# Patient Record
Sex: Male | Born: 1935 | Race: White | Hispanic: No | Marital: Married | Smoking: Former smoker
Health system: Southern US, Community
[De-identification: ages and names within clinical notes are randomized; demographics above are authoritative.]

## PROBLEM LIST (undated history)

## (undated) DIAGNOSIS — E785 Hyperlipidemia, unspecified: Secondary | ICD-10-CM

## (undated) DIAGNOSIS — H9193 Unspecified hearing loss, bilateral: Secondary | ICD-10-CM

## (undated) DIAGNOSIS — I10 Essential (primary) hypertension: Secondary | ICD-10-CM

## (undated) DIAGNOSIS — Z973 Presence of spectacles and contact lenses: Secondary | ICD-10-CM

## (undated) DIAGNOSIS — Z923 Personal history of irradiation: Secondary | ICD-10-CM

## (undated) DIAGNOSIS — N189 Chronic kidney disease, unspecified: Secondary | ICD-10-CM

## (undated) DIAGNOSIS — K219 Gastro-esophageal reflux disease without esophagitis: Secondary | ICD-10-CM

## (undated) DIAGNOSIS — I350 Nonrheumatic aortic (valve) stenosis: Secondary | ICD-10-CM

## (undated) DIAGNOSIS — I499 Cardiac arrhythmia, unspecified: Secondary | ICD-10-CM

## (undated) DIAGNOSIS — M109 Gout, unspecified: Secondary | ICD-10-CM

## (undated) DIAGNOSIS — D649 Anemia, unspecified: Secondary | ICD-10-CM

## (undated) DIAGNOSIS — R011 Cardiac murmur, unspecified: Secondary | ICD-10-CM

## (undated) DIAGNOSIS — G4730 Sleep apnea, unspecified: Secondary | ICD-10-CM

## (undated) HISTORY — PX: EYE SURGERY: SHX253

## (undated) HISTORY — PX: PERITONEAL CATHETER INSERTION: SHX2223

## (undated) HISTORY — PX: UPPER GI ENDOSCOPY: SHX6162

## (undated) HISTORY — PX: COLONOSCOPY: SHX174

---

## 2012-08-20 DIAGNOSIS — D229 Melanocytic nevi, unspecified: Secondary | ICD-10-CM | POA: Insufficient documentation

## 2015-02-09 DIAGNOSIS — I10 Essential (primary) hypertension: Secondary | ICD-10-CM | POA: Insufficient documentation

## 2015-02-09 DIAGNOSIS — E785 Hyperlipidemia, unspecified: Secondary | ICD-10-CM | POA: Insufficient documentation

## 2016-08-14 DIAGNOSIS — H9193 Unspecified hearing loss, bilateral: Secondary | ICD-10-CM | POA: Insufficient documentation

## 2016-08-14 DIAGNOSIS — K5790 Diverticulosis of intestine, part unspecified, without perforation or abscess without bleeding: Secondary | ICD-10-CM | POA: Insufficient documentation

## 2016-09-13 DIAGNOSIS — I350 Nonrheumatic aortic (valve) stenosis: Secondary | ICD-10-CM | POA: Insufficient documentation

## 2016-09-13 DIAGNOSIS — C4491 Basal cell carcinoma of skin, unspecified: Secondary | ICD-10-CM | POA: Insufficient documentation

## 2016-09-19 DIAGNOSIS — Q6102 Congenital multiple renal cysts: Secondary | ICD-10-CM | POA: Insufficient documentation

## 2016-12-17 DIAGNOSIS — Z9289 Personal history of other medical treatment: Secondary | ICD-10-CM

## 2016-12-17 DIAGNOSIS — C801 Malignant (primary) neoplasm, unspecified: Secondary | ICD-10-CM

## 2016-12-17 HISTORY — DX: Personal history of other medical treatment: Z92.89

## 2016-12-17 HISTORY — DX: Malignant (primary) neoplasm, unspecified: C80.1

## 2016-12-18 DIAGNOSIS — Z85828 Personal history of other malignant neoplasm of skin: Secondary | ICD-10-CM | POA: Insufficient documentation

## 2017-03-12 DIAGNOSIS — N3941 Urge incontinence: Secondary | ICD-10-CM | POA: Insufficient documentation

## 2017-03-12 DIAGNOSIS — N289 Disorder of kidney and ureter, unspecified: Secondary | ICD-10-CM | POA: Insufficient documentation

## 2017-06-06 DIAGNOSIS — R935 Abnormal findings on diagnostic imaging of other abdominal regions, including retroperitoneum: Secondary | ICD-10-CM | POA: Insufficient documentation

## 2017-06-10 DIAGNOSIS — K862 Cyst of pancreas: Secondary | ICD-10-CM | POA: Insufficient documentation

## 2018-03-03 DIAGNOSIS — D378 Neoplasm of uncertain behavior of other specified digestive organs: Secondary | ICD-10-CM | POA: Insufficient documentation

## 2018-03-03 DIAGNOSIS — D490 Neoplasm of unspecified behavior of digestive system: Secondary | ICD-10-CM | POA: Insufficient documentation

## 2018-03-04 DIAGNOSIS — D509 Iron deficiency anemia, unspecified: Secondary | ICD-10-CM | POA: Insufficient documentation

## 2018-10-06 ENCOUNTER — Ambulatory Visit (INDEPENDENT_AMBULATORY_CARE_PROVIDER_SITE_OTHER): Payer: Medicare Other | Admitting: Podiatry

## 2018-10-06 DIAGNOSIS — M7752 Other enthesopathy of left foot: Secondary | ICD-10-CM

## 2018-10-06 DIAGNOSIS — I4510 Unspecified right bundle-branch block: Secondary | ICD-10-CM | POA: Insufficient documentation

## 2018-10-06 DIAGNOSIS — M10072 Idiopathic gout, left ankle and foot: Secondary | ICD-10-CM | POA: Diagnosis not present

## 2018-10-06 DIAGNOSIS — R739 Hyperglycemia, unspecified: Secondary | ICD-10-CM | POA: Insufficient documentation

## 2018-10-06 DIAGNOSIS — R011 Cardiac murmur, unspecified: Secondary | ICD-10-CM | POA: Insufficient documentation

## 2018-10-06 DIAGNOSIS — R55 Syncope and collapse: Secondary | ICD-10-CM | POA: Insufficient documentation

## 2018-10-06 DIAGNOSIS — R0989 Other specified symptoms and signs involving the circulatory and respiratory systems: Secondary | ICD-10-CM | POA: Insufficient documentation

## 2018-10-06 DIAGNOSIS — K635 Polyp of colon: Secondary | ICD-10-CM | POA: Insufficient documentation

## 2018-10-06 DIAGNOSIS — M259 Joint disorder, unspecified: Secondary | ICD-10-CM | POA: Insufficient documentation

## 2018-10-06 NOTE — Patient Instructions (Signed)

## 2018-10-08 NOTE — Progress Notes (Signed)
Subjective:   Patient ID: Jason Avila, male   DOB: 84 y.o.   MRN: [id number]   HPI 84 year old male presents the office today for concerns of swelling, redness to his left big toe and he points along the area to the bunion.  He states that started last Monday.  He went to his primary care physician on Thursday he states on Saturday he started to improve.  He states that improved on its own is doing much better today.  He denies any recent injury or trauma.  No significant treatment.  He has no other concerns.   Review of Systems  All other systems reviewed and are negative.  No past medical history on file.     Current Outpatient Medications:  .  amLODipine (NORVASC) 5 MG tablet, amlodipine 5 mg tablet, Disp: , Rfl:  .  ASPIRIN 81 PO, Take by mouth., Disp: , Rfl:  .  atorvastatin (LIPITOR) 20 MG tablet, atorvastatin 20 mg tablet, Disp: , Rfl:  .  furosemide (LASIX) 20 MG tablet, Take by mouth., Disp: , Rfl:  .  oxybutynin (DITROPAN-XL) 5 MG 24 hr tablet, Take by mouth., Disp: , Rfl:  .  triamcinolone cream (KENALOG) 0.1 %, Apply topically to areas of rash on back twice daily until resolved., Disp: , Rfl:  .  Cholecalciferol (VITAMIN D-1000 MAX ST) 1000 units tablet, Take by mouth., Disp: , Rfl:  .  Cholecalciferol (VITAMIN D3 PO), Vitamin D3 1,000 unit capsule  Take 1 capsule every day by oral route., Disp: , Rfl:  .  famotidine (PEPCID AC) 10 MG tablet, Pepcid AC 10 mg tablet  Take 1 tablet twice a day by oral route., Disp: , Rfl:  .  ferrous sulfate 325 (65 FE) MG tablet, Take by mouth., Disp: , Rfl:  .  hydrALAZINE (APRESOLINE) 50 MG tablet, hydralazine 50 mg tablet, Disp: , Rfl:  .  Ibuprofen (IBU-200 PO), IBU-200  as needed, Disp: , Rfl:  .  IRON PO, iron 65 mg tablet  Take 1 tablet every day by oral route., Disp: , Rfl:  .  neomycin-polymyxin-hydrocortisone (CORTISPORIN) 3.5-10000-1 OTIC suspension, neomycin-polymyxin-hydrocort 3.5 mg-10,000 unit/mL-1 % ear drops,susp  3-4 times  daily, Disp: , Rfl:  .  Omega-3 Fatty Acids (OMEGA 3 PO), Omega 3  QD 1040mg , Disp: , Rfl:  .  ranitidine (ZANTAC) 150 MG tablet, Take 150 mg by mouth daily., Disp: , Rfl: 3 .  tolterodine (DETROL) 2 MG tablet, tolterodine 2 mg tablet, Disp: , Rfl:   Not on File        Objective:  Physical Exam  General: AAO x3, NAD  Dermatological: There is mild edema erythema to the left foot along the first metatarsal phalangeal joint specifically along the area of the bunion. There are no open sores, no preulcerative lesions, no rash or signs of infection present.  Vascular: Dorsalis Pedis artery and Posterior Tibial artery pedal pulses are 2/4 bilateral with immedate capillary fill time. There is no pain with calf compression, swelling, warmth, erythema.   Neruologic: Grossly intact via light touch bilateral.Protective threshold with Semmes Wienstein monofilament intact to all pedal sites bilateral.   Musculoskeletal: Moderate bunion deformities present.  There is no pain or crepitation with MPJ range of motion.  There is no significant tenderness palpation of the area and overall he states he is doing substantially better than when he was.  Muscular strength 5/5 is present in all groups tested bilateral.  Gait: Unassisted, Nonantalgic.  Assessment:  Left foot first MPJ capsulitis, likely gout     Plan:  -Treatment options discussed including all alternatives, risks, and complications -Etiology of symptoms were discussed -I independently reviewed the x-rays that he brought into the office.  Given his clinical symptoms and spontaneous onset as well as resolution I do think that he has gout.  Dispensed offloading pads and discussed shoe modifications.  Discussed diet modifications.  Discussed that if the symptoms recur to call the office and even take a look but otherwise he is doing well and he has no pain.  Trula Slade DPM

## 2018-11-16 DIAGNOSIS — S065XAA Traumatic subdural hemorrhage with loss of consciousness status unknown, initial encounter: Secondary | ICD-10-CM

## 2018-11-16 DIAGNOSIS — S065X9A Traumatic subdural hemorrhage with loss of consciousness of unspecified duration, initial encounter: Secondary | ICD-10-CM

## 2018-11-16 HISTORY — DX: Traumatic subdural hemorrhage with loss of consciousness of unspecified duration, initial encounter: S06.5X9A

## 2018-11-16 HISTORY — DX: Traumatic subdural hemorrhage with loss of consciousness status unknown, initial encounter: S06.5XAA

## 2018-12-15 HISTORY — PX: BURR HOLE FOR SUBDURAL HEMATOMA: SHX1275

## 2019-10-14 ENCOUNTER — Other Ambulatory Visit: Payer: Self-pay | Admitting: Otolaryngology

## 2019-10-20 ENCOUNTER — Other Ambulatory Visit (HOSPITAL_COMMUNITY)
Admission: RE | Admit: 2019-10-20 | Discharge: 2019-10-20 | Disposition: A | Discharge status: Home / Self Care | Payer: Medicare Other | Source: Ambulatory Visit | Attending: Otolaryngology | Admitting: Otolaryngology

## 2019-10-20 DIAGNOSIS — Z01812 Encounter for preprocedural laboratory examination: Secondary | ICD-10-CM | POA: Insufficient documentation

## 2019-10-20 DIAGNOSIS — Z20828 Contact with and (suspected) exposure to other viral communicable diseases: Secondary | ICD-10-CM | POA: Diagnosis not present

## 2019-10-21 ENCOUNTER — Other Ambulatory Visit: Payer: Self-pay

## 2019-10-21 ENCOUNTER — Encounter (HOSPITAL_COMMUNITY): Payer: Self-pay | Admitting: *Deleted

## 2019-10-21 LAB — NOVEL CORONAVIRUS, NAA (HOSP ORDER, SEND-OUT TO REF LAB; TAT 18-24 HRS): SARS-CoV-2, NAA: NOT DETECTED

## 2019-10-21 NOTE — Progress Notes (Signed)
Patient denies shortness of breath, fever, cough and chest pain.  PCP - Dr Margaretha Sheffield Cardiologist - Dr Rosalita Chessman Hematology - Dr Jodelle Green  Chest x-ray - 12/12/18 1 view CE EKG - 12/15/18 CE Stress Test - yrs ago per patient in [facility] [facility] - denies Cardiac Cath - denies  Anesthesia review: Yes  STOP now taking any Aspirin (unless otherwise instructed by your surgeon), Aleve, Naproxen, Ibuprofen, Motrin, Advil, Goody's, BC's, all herbal medications, fish oil, and all vitamins.   Coronavirus Screening Covid test on 10/21/19 was negative.  Patient verbalized understanding of instructions that were given via phone.

## 2019-10-22 ENCOUNTER — Encounter (HOSPITAL_COMMUNITY): Payer: Self-pay | Admitting: Vascular Surgery

## 2019-10-22 NOTE — Progress Notes (Signed)
Anesthesia Chart Review:  Case: [id number] Date/Time: 10/23/19 0715   Procedure: MICROLARYNGOSCOPY WITH CO2 LASER AND EXCISION OF VOCAL CORD LESION (Left )   Anesthesia type: General   Pre-op diagnosis: vocal cord lesion   Location: [facility] OR ROOM 12 / [facility]   Surgeon: Jerrell Belfast, MD      DISCUSSION: Patient is a 85 year old male scheduled for the above procedure. Patient first seen by Dr. Wilburn Cornelia on 10/13/19 for evaluation of chronic hoarseness. By notes, hoarseness and raspy voice began about 2 years ago after undergoing several GI procedure including pancreatic biopsy for what turned out to be a "benign" pancreatic lesion. Flexible laryngoscopy showed "patent anterior nasal cavity with minimal crusting, no discharge or infection.  Normal base of tongue and supraglottis Normal vocal cord mobility, the patient has an ulcerated erythematous lesion involving the left vocal cord and extending to the anterior commissure. Hypopharynx normal without mass, pooling of secretions or aspiration."  Findings concerning for possible laryngeal carcinoma, so above procedure recommended.     History includes former smoker, CKD stage IV, anemia, HLD, aortic stenosis (mild, 2018), skin cancer (BCC, nose), OSA, right BBB, hearing loss (wears hearing aids), GERD, SDH (acute on chronic SDH 11/21/18 found during w/u for headaches; new hyperdensity within hematoma 11/28/18, s/p burr hole 12/15/18, decrease SDH size 03/13/19 CT)  Currently no primary care records, but according to records in [place], Cr 2.7-3.0 and HGB 7.8-8.5 11/2018-12/2018.   Received nephology and pulmonology records this afternoon 10/22/19.  He saw nephrologist Dr. Prescott Gum on 07/30/19 with labs. 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  Most recent renal trends show: 07/30/19: BUN 45, Cr 3.00, K 3.9 06/10/19: Cr 3.2, K 4.4 05/20/19: Cr 3.5, K 4.5 08/05/18: Cr 2.60, K 4.4  He last saw cardiologist Dr. Rosalita Chessman with [facility] on 03/25/19.  Patient was doing well at that time without CV symptoms. No new cardiac testing ordered. Patient felt back at baseline following burr holes for SDH and CKD is followed by nephrology. [facility] staff say he is scheduled for routine follow-up next month.   10/20/19 COVID-19 test negative. He is a same day work-up, but seen by nephrology and cardiologist within the past 3-7 months. S/p burr holes 11/2018 for acute on chronic SDH, with stable/decreased size on March 2020 CT. Anesthesia team to review labs and evaluate patient on the day of surgeyr.     VS: Ht 5\' 11"  (1.803 m)   Wt 77.1 kg   BMI 23.71 kg/m    PROVIDERS: PCP - Dr. Margaretha Sheffield Cardiologist - Dr. [facility] Fleet  Hematology - Dr. Donnella Bi, MD is neurosurgeon (see [facility] Care Everywhere) Loreli Slot, MD is Nephrologist is with [facility]   LABS: He is for labs on arrival. See DISCUSSION regarding known baseline results (CKD stage IV, anemia).    IMAGES: CT brain 03/13/19: IMPRESSION:4 1. Burr holes. 2. Interval decrease in size of left subdural hematoma and decrease in mass effect on underlying brain as well as decrease in left to right midline shift. 3. No evidence for acute hemorrhage  1V PCXR 12/17/18 ([facility]; done for post-operative fever): FINDINGS:  Increased density now demonstrated in the right mid and lower lung and within the left lower lung. There is also a small right-sided pleural effusion. Cardiomediastinal silhouette is grossly stable allowing for technical differences. IMPRESSION:  1.  Pulmonary opacities as noted above which may be  on the basis of pneumonia, or aspiration. Asymmetric pulmonary edema would also be a diagnostic consideration.   EKG:  EKG 03/25/19 ([facility]): SR. RBBB. LAD. Bifascicular block. EKG 12/15/18 [facility]): NSR, LAFB, RBBB.   CV: Echo 04/30/17 ([facility]) Conclusions: Normal LV systolic function. EF 70%. AV poorly visualized, calcified with mild aortic stenosis. Mean gradient of 9. Peak gradient of 15. Doppler evidence of grade II diastolic dysfunction. Mild-moderate MR. Mild-moderate TR. RVSP of 41.  Stress Echo 04/30/17 ([facility]): Conclusions: Adequate stress echocardiogram with no evidence of myocardial ischemia by electrocardiogram, clinical, or echocardiographic criteria at workload achieved. Low risk stress test.    Past Medical History:  Diagnosis Date  . Anemia   . Aortic valve stenosis    mild AS 04/2017 (followed by Dr. Cleora Fleet, [facility])  . Bilateral hearing loss    bilateral hearing aids  . Cancer ([place]) 2018   basal cell carcinoma on nose  . Chronic kidney disease    ckd stage 4, bilateral cysts on kidneys [facility])  . Dysrhythmia    RBBB  . GERD (gastroesophageal reflux disease)   . Gout    left foot  . Heart murmur    no problems  . History of blood transfusion   . Hyperlipidemia   . Hypertension   . Sleep apnea   . Subdural hematoma ([place]) 11/2018  . Wears glasses     Past Surgical History:  Procedure Laterality Date  . BURR HOLE FOR SUBDURAL HEMATOMA  12/15/2018   brain  . COLONOSCOPY    . EYE SURGERY Bilateral    cataracts removed  . UPPER GI ENDOSCOPY      MEDICATIONS: No current facility-administered medications for this encounter.    Marland Kitchen amLODipine (NORVASC) 5 MG tablet  . atorvastatin (LIPITOR) 20 MG tablet  . Cholecalciferol (VITAMIN D3) 50 MCG (2000 UT) TABS  . COLACE 100 MG capsule  . famotidine (PEPCID AC) 10 MG tablet  . ferrous sulfate 325 (65 FE) MG tablet  . hydrALAZINE (APRESOLINE) 50 MG tablet  . sodium bicarbonate 650 MG tablet    Myra Gianotti, PA-C Surgical Short Stay/Anesthesiology [facility] Phone [phone] [facility] Phone [phone] 10/22/2019 3:59 PM

## 2019-10-22 NOTE — Anesthesia Preprocedure Evaluation (Addendum)
Anesthesia Evaluation  Patient identified by MRN, date of birth, ID band Patient awake    Reviewed: Allergy & Precautions, NPO status , Patient's Chart, lab work & pertinent test results  Airway Mallampati: II  TM Distance: >3 FB Neck ROM: Full    Dental no notable dental hx. (+) Teeth Intact   Pulmonary former smoker,    Pulmonary exam normal breath sounds clear to auscultation       Cardiovascular hypertension, Pt. on medications Normal cardiovascular exam+ Valvular Problems/Murmurs AS  Rhythm:Regular Rate:Normal     Neuro/Psych negative neurological ROS  negative psych ROS   GI/Hepatic Neg liver ROS, GERD  ,  Endo/Other    Renal/GU CRF and Renal InsufficiencyRenal disease     Musculoskeletal   Abdominal   Peds  Hematology  (+) Blood dyscrasia, anemia ,   Anesthesia Other Findings   Reproductive/Obstetrics                           Anesthesia Physical Anesthesia Plan  ASA: II  Anesthesia Plan: General   Post-op Pain Management:    Induction: Intravenous  PONV Risk Score and Plan: 2 and Treatment may vary due to age or medical condition, Ondansetron and Dexamethasone  Airway Management Planned: Oral ETT  Additional Equipment: None  Intra-op Plan:   Post-operative Plan: Extubation in OR  Informed Consent: I have reviewed the patients History and Physical, chart, labs and discussed the procedure including the risks, benefits and alternatives for the proposed anesthesia with the patient or authorized representative who has indicated his/her understanding and acceptance.     Dental advisory given  Plan Discussed with: CRNA  Anesthesia Plan Comments: (See PAT note written 10/22/2019 by Myra Gianotti, PA-C. SAME DAY WORK-UP. Mild AS in 2018. Known CKD4. Chronic SDH, s/p burr holes 11/2018. Diagnosis of MDS leukemia 05/2019 per nephrology notes.    )      Anesthesia Quick  Evaluation

## 2019-10-23 ENCOUNTER — Ambulatory Visit (HOSPITAL_COMMUNITY)
Admission: RE | Admit: 2019-10-23 | Discharge: 2019-10-23 | Disposition: A | Discharge status: Home / Self Care | Payer: Medicare Other | Attending: Otolaryngology | Admitting: Otolaryngology

## 2019-10-23 ENCOUNTER — Ambulatory Visit (HOSPITAL_COMMUNITY): Payer: Medicare Other | Admitting: Vascular Surgery

## 2019-10-23 ENCOUNTER — Encounter (HOSPITAL_COMMUNITY)
Admission: RE | Disposition: A | Discharge status: Home / Self Care | Payer: Self-pay | Source: Home / Self Care | Attending: Otolaryngology

## 2019-10-23 ENCOUNTER — Encounter (HOSPITAL_COMMUNITY): Payer: Self-pay

## 2019-10-23 ENCOUNTER — Other Ambulatory Visit: Payer: Self-pay

## 2019-10-23 DIAGNOSIS — G4733 Obstructive sleep apnea (adult) (pediatric): Secondary | ICD-10-CM | POA: Insufficient documentation

## 2019-10-23 DIAGNOSIS — I350 Nonrheumatic aortic (valve) stenosis: Secondary | ICD-10-CM | POA: Diagnosis not present

## 2019-10-23 DIAGNOSIS — D020 Carcinoma in situ of larynx: Secondary | ICD-10-CM | POA: Diagnosis not present

## 2019-10-23 DIAGNOSIS — N184 Chronic kidney disease, stage 4 (severe): Secondary | ICD-10-CM | POA: Insufficient documentation

## 2019-10-23 DIAGNOSIS — E785 Hyperlipidemia, unspecified: Secondary | ICD-10-CM | POA: Diagnosis not present

## 2019-10-23 DIAGNOSIS — Z79899 Other long term (current) drug therapy: Secondary | ICD-10-CM | POA: Diagnosis not present

## 2019-10-23 DIAGNOSIS — G4730 Sleep apnea, unspecified: Secondary | ICD-10-CM | POA: Diagnosis not present

## 2019-10-23 DIAGNOSIS — I129 Hypertensive chronic kidney disease with stage 1 through stage 4 chronic kidney disease, or unspecified chronic kidney disease: Secondary | ICD-10-CM | POA: Diagnosis not present

## 2019-10-23 DIAGNOSIS — Z85828 Personal history of other malignant neoplasm of skin: Secondary | ICD-10-CM | POA: Diagnosis not present

## 2019-10-23 DIAGNOSIS — K219 Gastro-esophageal reflux disease without esophagitis: Secondary | ICD-10-CM | POA: Diagnosis not present

## 2019-10-23 DIAGNOSIS — J383 Other diseases of vocal cords: Secondary | ICD-10-CM

## 2019-10-23 DIAGNOSIS — Z87891 Personal history of nicotine dependence: Secondary | ICD-10-CM | POA: Diagnosis not present

## 2019-10-23 DIAGNOSIS — C946 Myelodysplastic disease, not classified: Secondary | ICD-10-CM | POA: Diagnosis not present

## 2019-10-23 DIAGNOSIS — R490 Dysphonia: Secondary | ICD-10-CM | POA: Diagnosis present

## 2019-10-23 HISTORY — DX: Unspecified hearing loss, bilateral: H91.93

## 2019-10-23 HISTORY — DX: Nonrheumatic aortic (valve) stenosis: I35.0

## 2019-10-23 HISTORY — DX: Sleep apnea, unspecified: G47.30

## 2019-10-23 HISTORY — DX: Cardiac arrhythmia, unspecified: I49.9

## 2019-10-23 HISTORY — DX: Gastro-esophageal reflux disease without esophagitis: K21.9

## 2019-10-23 HISTORY — DX: Cardiac murmur, unspecified: R01.1

## 2019-10-23 HISTORY — DX: Essential (primary) hypertension: I10

## 2019-10-23 HISTORY — DX: Anemia, unspecified: D64.9

## 2019-10-23 HISTORY — DX: Hyperlipidemia, unspecified: E78.5

## 2019-10-23 HISTORY — DX: Presence of spectacles and contact lenses: Z97.3

## 2019-10-23 HISTORY — DX: Gout, unspecified: M10.9

## 2019-10-23 HISTORY — PX: MICROLARYNGOSCOPY: SHX5208

## 2019-10-23 HISTORY — DX: Chronic kidney disease, unspecified: N18.9

## 2019-10-23 LAB — CBC
HCT: 31.5 % — ABNORMAL LOW (ref 39.0–52.0)
Hemoglobin: 10 g/dL — ABNORMAL LOW (ref 13.0–17.0)
MCH: 27.4 pg (ref 26.0–34.0)
MCHC: 31.7 g/dL (ref 30.0–36.0)
MCV: 86.3 fL (ref 80.0–100.0)
Platelets: 224 10*3/uL (ref 150–400)
RBC: 3.65 MIL/uL — ABNORMAL LOW (ref 4.22–5.81)
RDW: 13.7 % (ref 11.5–15.5)
WBC: 3.8 10*3/uL — ABNORMAL LOW (ref 4.0–10.5)
nRBC: 0 % (ref 0.0–0.2)

## 2019-10-23 LAB — BASIC METABOLIC PANEL
Anion gap: 9 (ref 5–15)
BUN: 42 mg/dL — ABNORMAL HIGH (ref 8–23)
CO2: 24 mmol/L (ref 22–32)
Calcium: 8.7 mg/dL — ABNORMAL LOW (ref 8.9–10.3)
Chloride: 107 mmol/L (ref 98–111)
Creatinine, Ser: 3.86 mg/dL — ABNORMAL HIGH (ref 0.61–1.24)
GFR calc Af Amer: 16 mL/min — ABNORMAL LOW (ref >60)
GFR calc non Af Amer: 13 mL/min — ABNORMAL LOW (ref >60)
Glucose, Bld: 102 mg/dL — ABNORMAL HIGH (ref 70–99)
Potassium: 3.3 mmol/L — ABNORMAL LOW (ref 3.5–5.1)
Sodium: 140 mmol/L (ref 135–145)

## 2019-10-23 SURGERY — MICROLARYNGOSCOPY
Anesthesia: General | Site: Throat

## 2019-10-23 MED ORDER — EPHEDRINE SULFATE 50 MG/ML IJ SOLN
INTRAMUSCULAR | Status: DC | PRN
  Administered 2019-10-23: 5 mg via INTRAVENOUS

## 2019-10-23 MED ORDER — DEXAMETHASONE SODIUM PHOSPHATE 10 MG/ML IJ SOLN
INTRAMUSCULAR | Status: AC
  Filled 2019-10-23: qty 1

## 2019-10-23 MED ORDER — DEXAMETHASONE SODIUM PHOSPHATE 10 MG/ML IJ SOLN
INTRAMUSCULAR | Status: DC | PRN
  Administered 2019-10-23: 10 mg via INTRAVENOUS

## 2019-10-23 MED ORDER — LIDOCAINE 2% (20 MG/ML) 5 ML SYRINGE
INTRAMUSCULAR | Status: AC
  Filled 2019-10-23: qty 5

## 2019-10-23 MED ORDER — ONDANSETRON HCL 4 MG/2ML IJ SOLN
INTRAMUSCULAR | Status: DC | PRN
  Administered 2019-10-23: 4 mg via INTRAVENOUS

## 2019-10-23 MED ORDER — PROPOFOL 10 MG/ML IV BOLUS
INTRAVENOUS | Status: AC
  Filled 2019-10-23: qty 20

## 2019-10-23 MED ORDER — FENTANYL CITRATE (PF) 250 MCG/5ML IJ SOLN
INTRAMUSCULAR | Status: DC | PRN
  Administered 2019-10-23: 25 ug via INTRAVENOUS
  Administered 2019-10-23: 50 ug via INTRAVENOUS
  Administered 2019-10-23: 25 ug via INTRAVENOUS

## 2019-10-23 MED ORDER — SODIUM CHLORIDE 0.9 % IV SOLN
INTRAVENOUS | Status: DC | PRN
  Administered 2019-10-23: 08:00:00 via INTRAVENOUS

## 2019-10-23 MED ORDER — SUGAMMADEX SODIUM 200 MG/2ML IV SOLN
INTRAVENOUS | Status: DC | PRN
  Administered 2019-10-23: 155 mg via INTRAVENOUS

## 2019-10-23 MED ORDER — FENTANYL CITRATE (PF) 250 MCG/5ML IJ SOLN
INTRAMUSCULAR | Status: AC
  Filled 2019-10-23: qty 5

## 2019-10-23 MED ORDER — ONDANSETRON HCL 4 MG/2ML IJ SOLN
INTRAMUSCULAR | Status: AC
  Filled 2019-10-23: qty 2

## 2019-10-23 MED ORDER — ROCURONIUM BROMIDE 10 MG/ML (PF) SYRINGE
PREFILLED_SYRINGE | INTRAVENOUS | Status: AC
  Filled 2019-10-23: qty 10

## 2019-10-23 MED ORDER — EPINEPHRINE HCL (NASAL) 0.1 % NA SOLN
NASAL | Status: AC
  Filled 2019-10-23: qty 30

## 2019-10-23 MED ORDER — EPINEPHRINE PF 1 MG/ML IJ SOLN
INTRAMUSCULAR | Status: DC | PRN
  Administered 2019-10-23: 30 mL

## 2019-10-23 SURGICAL SUPPLY — 20 items
BNDG EYE OVAL (GAUZE/BANDAGES/DRESSINGS) ×6 IMPLANT
CANISTER SUCT 3000ML PPV (MISCELLANEOUS) ×3 IMPLANT
CONT SPEC 4OZ CLIKSEAL STRL BL (MISCELLANEOUS) ×6 IMPLANT
COVER BACK TABLE 60X90IN (DRAPES) ×3 IMPLANT
COVER WAND RF STERILE (DRAPES) ×3 IMPLANT
DRAPE HALF SHEET 40X57 (DRAPES) ×3 IMPLANT
GAUZE SPONGE 4X4 12PLY STRL (GAUZE/BANDAGES/DRESSINGS) ×3 IMPLANT
GLOVE BIO SURGEON STRL SZ7 (GLOVE) ×3 IMPLANT
KIT BASIN OR (CUSTOM PROCEDURE TRAY) ×3 IMPLANT
KIT TURNOVER KIT B (KITS) ×3 IMPLANT
NEEDLE HYPO 25GX1X1/2 BEV (NEEDLE) IMPLANT
NS IRRIG 1000ML POUR BTL (IV SOLUTION) ×3 IMPLANT
PAD ARMBOARD 7.5X6 YLW CONV (MISCELLANEOUS) ×6 IMPLANT
PATTIES SURGICAL .5 X3 (DISPOSABLE) ×3 IMPLANT
POSITIONER HEAD DONUT 9IN (MISCELLANEOUS) IMPLANT
SOL ANTI FOG 6CC (MISCELLANEOUS) ×2 IMPLANT
SOLUTION ANTI FOG 6CC (MISCELLANEOUS) ×1
TOWEL GREEN STERILE FF (TOWEL DISPOSABLE) ×3 IMPLANT
TUBE CONNECTING 12X1/4 (SUCTIONS) ×3 IMPLANT
WATER STERILE IRR 1000ML POUR (IV SOLUTION) ×3 IMPLANT

## 2019-10-23 NOTE — Anesthesia Procedure Notes (Signed)
Procedure Name: Intubation Date/Time: 10/23/2019 7:49 AM Performed by: Amadeo Garnet, CRNA Pre-anesthesia Checklist: Emergency Drugs available, Suction available, Patient being monitored, Patient identified and Timeout performed Patient Re-evaluated:Patient Re-evaluated prior to induction Oxygen Delivery Method: Circle system utilized Preoxygenation: Pre-oxygenation with 100% oxygen Induction Type: IV induction Ventilation: Mask ventilation without difficulty Laryngoscope Size: Mac and 3 Grade View: Grade I Tube type: Oral Tube size: 6.5 mm Airway Equipment and Method: Stylet Placement Confirmation: ETT inserted through vocal cords under direct vision,  positive ETCO2 and breath sounds checked- equal and bilateral Secured at: 23 cm Tube secured with: Tape Dental Injury: Teeth and Oropharynx as per pre-operative assessment

## 2019-10-23 NOTE — Anesthesia Postprocedure Evaluation (Signed)
Anesthesia Post Note  Patient: Jason Avila  Procedure(s) Performed: Microlaryngoscopy with vocal cord lesion biopsy (N/A Throat)     Patient location during evaluation: PACU Anesthesia Type: General Level of consciousness: awake and alert Pain management: pain level controlled Vital Signs Assessment: post-procedure vital signs reviewed and stable Respiratory status: spontaneous breathing, nonlabored ventilation, respiratory function stable and patient connected to nasal cannula oxygen Cardiovascular status: blood pressure returned to baseline and stable Postop Assessment: no apparent nausea or vomiting Anesthetic complications: no    Last Vitals:  Vitals:   10/23/19 0557 10/23/19 0828  BP: (!) 169/57   Pulse: 78   Resp: 18   Temp: 36.7 C 36.7 C  SpO2: 99%     Last Pain:  Vitals:   10/23/19 0828  TempSrc:   PainSc: 0-No pain                 Barnet Glasgow

## 2019-10-23 NOTE — H&P (Signed)
Jason Avila is an 85 y.o. adult.   Chief Complaint: Chronic Hoarseness HPI: Former smoker, 2 year hx of hoarseness  Past Medical History:  Diagnosis Date  . Anemia   . Aortic valve stenosis    mild AS 04/2017 (followed by Dr. Cleora Fleet, [facility])  . Bilateral hearing loss    bilateral hearing aids  . Cancer ([place]) 2018   basal cell carcinoma on nose  . Chronic kidney disease    ckd stage 4, bilateral cysts on kidneys [facility])  . Dysrhythmia    RBBB  . GERD (gastroesophageal reflux disease)   . Gout    left foot  . Heart murmur    no problems  . History of blood transfusion   . Hyperlipidemia   . Hypertension   . Sleep apnea   . Subdural hematoma ([place]) 11/2018  . Wears glasses     Past Surgical History:  Procedure Laterality Date  . BURR HOLE FOR SUBDURAL HEMATOMA  12/15/2018   brain  . COLONOSCOPY    . EYE SURGERY Bilateral    cataracts removed  . UPPER GI ENDOSCOPY      History reviewed. No pertinent family history. Social History:  reports that she has quit smoking. Her smoking use included cigarettes. She quit after 40.00 years of use. She has never used smokeless tobacco. She reports that she does not drink alcohol or use drugs.  Allergies: No Known Allergies  Medications Prior to Admission  Medication Sig Dispense Refill  . amLODipine (NORVASC) 5 MG tablet Take 5 mg by mouth daily.     Marland Kitchen atorvastatin (LIPITOR) 20 MG tablet Take 20 mg by mouth daily at 6 PM.     . Cholecalciferol (VITAMIN D3) 50 MCG (2000 UT) TABS Take 2,000 Units by mouth daily.     Marland Kitchen COLACE 100 MG capsule Take 100 mg by mouth every other day.    . famotidine (PEPCID AC) 10 MG tablet Take 20 mg by mouth 2 (two) times daily.     . ferrous sulfate 325 (65 FE) MG tablet Take 325 mg by mouth daily with breakfast.     . hydrALAZINE (APRESOLINE) 50 MG tablet Take 50 mg by mouth 3 (three) times daily.     . sodium bicarbonate 650 MG tablet Take 650 mg by mouth 2 (two) times  daily.      Results for orders placed or performed during the hospital encounter of 10/23/19 (from the past 48 hour(s))  CBC     Status: Abnormal   Collection Time: 10/23/19  6:12 AM  Result Value Ref Range   WBC 3.8 (L) 4.0 - 10.5 K/uL   RBC 3.65 (L) 4.22 - 5.81 MIL/uL   Hemoglobin 10.0 (L) 13.0 - 17.0 g/dL   HCT 31.5 (L) 39.0 - 52.0 %   MCV 86.3 80.0 - 100.0 fL   MCH 27.4 26.0 - 34.0 pg   MCHC 31.7 30.0 - 36.0 g/dL   RDW 13.7 11.5 - 15.5 %   Platelets 224 150 - 400 K/uL   nRBC 0.0 0.0 - 0.2 %    Comment: Performed at [facility], [place] [address] [place], [place] [address]  Basic metabolic panel     Status: Abnormal   Collection Time: 10/23/19  6:12 AM  Result Value Ref Range   Sodium 140 135 - 145 mmol/L   Potassium 3.3 (L) 3.5 - 5.1 mmol/L   Chloride 107 98 - 111 mmol/L   CO2  24 22 - 32 mmol/L   Glucose, Bld 102 (H) 70 - 99 mg/dL   BUN 42 (H) 8 - 23 mg/dL   Creatinine, Ser 3.86 (H) 0.61 - 1.24 mg/dL   Calcium 8.7 (L) 8.9 - 10.3 mg/dL   GFR calc non Af Amer 13 (L) >60 mL/min   GFR calc Af Amer 16 (L) >60 mL/min   Anion gap 9 5 - 15    Comment: Performed at [facility] [address], [place], [place] [address]   No results found.  Review of Systems  Constitutional: Negative.   HENT:       Hoarseness  Respiratory: Negative.   Cardiovascular: Negative.     Blood pressure (!) 169/57, pulse 78, temperature 98.1 F (36.7 C), temperature source Oral, resp. rate 18, height 5\' 11"  (1.803 m), weight 77.1 kg, SpO2 99 %. Physical Exam  Constitutional: She appears well-developed and well-nourished.  HENT:  Left vocal cord lesion  Neck: Normal range of motion. Neck supple.  Respiratory: Effort normal.  GI: Soft.     Assessment/Plan Adm for DL and bx  Jerrell Belfast, MD 10/23/2019, 7:30 AM

## 2019-10-23 NOTE — Op Note (Signed)
Operative Note: MICROLARYNGOSCOPY and BIOPSY  Patient: Jason Avila record number: [id number]  Date:10/23/2019  Pre-operative Indications: Chronic hoarseness  Postoperative Indications: Same  Surgical Procedure:  Microlaryngoscopy    Biopsy of left vocal cord lesion  Anesthesia: General endotracheal  Surgeon: Delsa Bern, M.D.  Assist: None  Complications: None  EBL: None   Brief History: The patient is a 85 y.o. adult with a history of 2-year history of hoarseness.  The patient is a former smoker and discontinued more than 20 years ago.  He denies sore throat, respiratory concerns or difficulty swallowing.  Laryngoscopy performed in the office showed an exophytic lesion involving the left vocal cord with normal mobility. Given the patient's history and findings I recommended microlaryngoscopy and biopsy under general anesthesia, risks and benefits were discussed in detail with the patient and their family. They understand and agree with our plan for surgery which is scheduled at [facility] on an elective basis.  Surgical Procedure: The patient is brought to the operating room on 10/23/2019 and placed in supine position on the operating table. General endotracheal anesthesia was established without difficulty. When the patient was adequately anesthetized, surgical timeout was performed and correct identification of the patient and the surgical procedure. The patient was positioned and prepped and draped in sterile fashion.  Laryngoscopy was then undertaken with examination of the oral cavity, oropharynx, supraglottis, larynx and hypopharynx.  No other abnormalities were identified.  The laryngoscope was positioned for direct visualization of the patient's larynx.  Endotracheal tube was then removed and jet ventilation was undertaken for maintenance of patient's general anesthesia.  Patient ventilated well with good gas exchange.  With the patient stable and prepared  for surgery with adequate general anesthesia, the operating microscope was moved into position for microlaryngoscopy.  Excellent visualization of the entire larynx was achieved.  The patient was found to have broadly based exophytic lesion involving the anterior half of the left vocal cord with normal vocal cord mobility.  The vocal cord was firm and the lesion extended to the anterior commissure.  Using cup forceps, multiple biopsies were taken including superficial and deep aspects of the lesion.  Reexamination showed minimal bleeding, adrenaline soaked cottonoid pledget was placed over the biopsy site and left in place for several minutes for vasoconstriction.  There was no significant bleeding.  The patient's airway was stable, laryngoscope was removed without difficulty and the patient was managed with mask ventilation anesthesia for the remainder of his anesthesia recovery.  There was no evidence of airway trauma, no bleeding, no intraoral injury or tooth trauma.  Patient was awakened from anesthetic and transferred from the operating room to the recovery room in stable condition. There were no complications and blood loss was minimal.   Delsa Bern, M.D. [facility] 10/23/2019

## 2019-10-23 NOTE — Transfer of Care (Signed)
Immediate Anesthesia Transfer of Care Note  Patient: Jason Avila  Procedure(s) Performed: Microlaryngoscopy with vocal cord lesion biopsy (N/A Throat)  Patient Location: PACU  Anesthesia Type:General  Level of Consciousness: awake, alert  and oriented  Airway & Oxygen Therapy: Patient Spontanous Breathing and Patient connected to face mask oxygen  Post-op Assessment: Report given to RN, Post -op Vital signs reviewed and stable and Patient moving all extremities  Post vital signs: Reviewed and stable  Last Vitals:  Vitals Value Taken Time  BP 167/72 10/23/19 0826  Temp    Pulse 89 10/23/19 0829  Resp 18 10/23/19 0829  SpO2 100 % 10/23/19 0829  Vitals shown include unvalidated device data.  Last Pain:  Vitals:   10/23/19 0610  TempSrc:   PainSc: 0-No pain      Patients Stated Pain Goal: 3 (98/33/82 5053)  Complications: No apparent anesthesia complications

## 2019-10-23 NOTE — Progress Notes (Signed)
Preexisting hoarseness persists, no change

## 2019-10-24 ENCOUNTER — Encounter (HOSPITAL_COMMUNITY): Payer: Self-pay | Admitting: Otolaryngology

## 2019-10-26 LAB — SURGICAL PATHOLOGY

## 2019-11-02 ENCOUNTER — Telehealth: Payer: Self-pay | Admitting: *Deleted

## 2019-11-02 NOTE — Telephone Encounter (Signed)
Oncology Nurse Navigator Documentation  Placed introductory call to new referral patient Mr. Heinzman.  He was not available, spoke with his wife.  Introduced myself as the H&N oncology nurse navigator that works with Dr. Isidore Moos to whom husband has been referred by Dr. Wilburn Cornelia.  She confirmed understanding of referral, confirmed understanding of 11/23 8:00 telephone consult with Dr. Isidore Moos preceded by 7:30 nurse evaluation.  Briefly explained my role as their navigator, provided my contact information, noted I will be joining them during consult with Dr. Isidore Moos.   She expressed they have mixed feelings about driving to [facility] for RT but noted "I drove my dtr to [facility] every day for 7 weeks when she had RT for breast cancer".  I assured her Dr. Isidore Moos will objectively discuss options for treatment location if they prefer RT closer to home.    She verbalized understanding of information provided, agreed to call me with any questions prior to next week's appt.  Gayleen Orem, RN, BSN Head & Neck Oncology Nurse [place] at [place] [phone]

## 2019-11-03 NOTE — Progress Notes (Signed)
Head and Neck Cancer Location of Tumor / Histology:  10/23/19 FINAL MICROSCOPIC DIAGNOSIS: A. VOCAL CORD, LEFT LESION, BIOPSY: - At least squamous cell carcinoma in situ. B. ANTERIOR COMMISURE, BIOPSY: - At least squamous cell carcinoma in situ.  Patient presented with symptoms of: A two year history of hoarseness.  Biopsies of left vocal cord revealed: at least squamous cell carcinoma in situ  Nutrition Status Yes No Comments  Weight changes? [x]  []  He has lost about 15 lbs over the last 2 years or so.  Swallowing concerns? []  [x]    PEG? []  [x]     Referrals Yes No Comments  Social Work? []  [x]    Dentistry? []  [x]    Swallowing therapy? []  [x]    Nutrition? []  [x]    Med/Onc? []  [x]     Safety Issues Yes No Comments  Prior radiation? []  [x]    Pacemaker/ICD? []  [x]    Possible current pregnancy? []  [x]    Is the patient on methotrexate? []  [x]     Tobacco/Marijuana/Snuff/ETOH use: He quit smoking 20 years ago.  Past/Anticipated interventions by otolaryngology, if any:  10/23/19 Dr. Wilburn Cornelia Operative Note: MICROLARYNGOSCOPY and BIOPSY  Past/Anticipated interventions by medical oncology, if any:  Not scheduled at time of note.    Current Complaints / other details:

## 2019-11-05 ENCOUNTER — Telehealth: Payer: Self-pay | Admitting: *Deleted

## 2019-11-05 NOTE — Telephone Encounter (Signed)
Oncology Nurse Navigator Documentation  Returned call to pt's wife, confirmed 11/23 7:30 NE and 8:00 Consult with Dr. Isidore Moos are via telephone.  Gayleen Orem, RN, BSN Head & Neck Oncology Nurse [place] at [place] [phone]

## 2019-11-06 ENCOUNTER — Telehealth: Payer: Self-pay | Admitting: Radiation Oncology

## 2019-11-06 NOTE — Telephone Encounter (Signed)
Called patient to verify telephone visit for pre reg

## 2019-11-09 ENCOUNTER — Ambulatory Visit
Admission: RE | Admit: 2019-11-09 | Discharge: 2019-11-09 | Disposition: A | Discharge status: Home / Self Care | Payer: Medicare Other | Source: Ambulatory Visit | Attending: Radiation Oncology | Admitting: Radiation Oncology

## 2019-11-09 ENCOUNTER — Encounter: Payer: Self-pay | Admitting: *Deleted

## 2019-11-09 ENCOUNTER — Other Ambulatory Visit: Payer: Self-pay

## 2019-11-09 ENCOUNTER — Encounter: Payer: Self-pay | Admitting: Radiation Oncology

## 2019-11-09 DIAGNOSIS — C320 Malignant neoplasm of glottis: Secondary | ICD-10-CM

## 2019-11-09 NOTE — Progress Notes (Signed)
Radiation Oncology         [phone] ________________________________  Initial outpatient Consultation by telephone as patient was unable to access MyChart video during pandemic precautions  Name: Jason Avila MRN: [id number]  Date: 11/09/2019  DOB: 10-03-1933  WV:[id number], Pcp Not In  Jerrell Belfast, MD   REFERRING PHYSICIAN: Jerrell Belfast, MD  DIAGNOSIS:    ICD-10-CM   1. 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 adult who presented with hoarseness for at least two years.  Subsequently, the patient saw Dr. Wilburn Cornelia who performed laryngoscopy showing.  "Normal vocal cord mobility, the patient has an ulcerated erythematous lesion involving the left vocal cord and extending to the anterior commissure. "  Biopsies on 10-23-19 revealed: 10/23/19 FINAL MICROSCOPIC DIAGNOSIS: A. VOCAL CORD, LEFT LESION, BIOPSY: - At least squamous cell carcinoma in situ. B. ANTERIOR COMMISURE, BIOPSY: - At least squamous cell carcinoma in situ.   Swallowing issues, if any: none  Lumps in neck: no  No throat pain.  Weight Changes: ~15 lb loss over several months, but now maintaining    Tobacco history, if any: quit smoking 1998  Prior cancers, if any: Basal cell carcinoma of face   PREVIOUS RADIATION THERAPY: No  PAST MEDICAL HISTORY:  has a past medical history of Anemia, Aortic valve stenosis, Bilateral hearing loss, Cancer ([place]) (2018), Chronic kidney disease, Dysrhythmia, GERD (gastroesophageal reflux disease), Gout, Heart murmur, History of blood transfusion (2018), Hyperlipidemia, Hypertension, Sleep apnea, Subdural hematoma ([place]) (11/2018), and Wears glasses.    PAST  SURGICAL HISTORY: Past Surgical History:  Procedure Laterality Date  . BURR HOLE FOR SUBDURAL HEMATOMA  12/15/2018   brain  . COLONOSCOPY    . EYE SURGERY Bilateral    cataracts removed  . MICROLARYNGOSCOPY N/A 10/23/2019   Procedure: Microlaryngoscopy with vocal cord lesion biopsy;  Surgeon: Jerrell Belfast, MD;  Location: [facility];  Service: ENT;  Laterality: N/A;  . UPPER GI ENDOSCOPY      FAMILY HISTORY: family history is not on file.  SOCIAL HISTORY:  reports that she has quit smoking. Her smoking use included cigarettes. She quit after 40.00 years of use. She has never used smokeless tobacco. She reports that she does not drink alcohol or use drugs.  ALLERGIES: Patient has no known allergies.  MEDICATIONS:  Current Outpatient Medications  Medication Sig Dispense Refill  . amLODipine (NORVASC) 5 MG tablet Take 5 mg by mouth daily.     Marland Kitchen atorvastatin (LIPITOR) 20 MG tablet Take 20 mg by mouth daily at 6 PM.     . Cholecalciferol (VITAMIN D3) 50 MCG (2000 UT) TABS Take 2,000 Units by mouth daily.     Marland Kitchen COLACE 100 MG capsule Take 100 mg by mouth every other day.    . ferrous sulfate 325 (65 FE) MG tablet Take 325 mg by mouth daily with breakfast.     . hydrALAZINE (APRESOLINE) 50 MG tablet Take 50 mg by mouth 3 (three) times daily.     . sodium bicarbonate 650 MG tablet Take 650 mg by mouth 2 (two) times daily.     No current facility-administered medications for this encounter.    REVIEW OF SYSTEMS:  Notable for that above.   PHYSICAL EXAM:  vitals were not taken for this visit.     GEN: NAD  LABORATORY DATA:  Lab Results  Component Value Date   WBC 3.8 (L) 10/23/2019   HGB 10.0 (L) 10/23/2019   HCT 31.5 (L) 10/23/2019   MCV 86.3 10/23/2019   PLT 224 10/23/2019   CMP     Component Value Date/Time   NA 140 10/23/2019 0612   K 3.3 (L) 10/23/2019 0612   CL 107 10/23/2019 0612   CO2 24 10/23/2019 0612   GLUCOSE 102 (H) 10/23/2019 0612   BUN 42 (H) 10/23/2019 0612    CREATININE 3.86 (H) 10/23/2019 0612   CALCIUM 8.7 (L) 10/23/2019 0612   GFRNONAA 13 (L) 10/23/2019 0612   GFRAA 16 (L) 10/23/2019 0612      No results found for: TSH   RADIOGRAPHY: No results found.    IMPRESSION/PLAN:  This is a delightful patient with head and neck in situ disease and possibly cancer. I recommend radiotherapy for this patient.  We discussed the potential risks, benefits, and side effects of radiotherapy. We talked in detail about acute and late effects. We discussed that some of the most bothersome acute effects may be mucositis, dysgeusia, salivary changes, skin irritation, hair loss, dehydration, weight loss and fatigue. We talked about late effects which include but are not necessarily limited to dysphagia, hypothyroidism, permanent skin changes, injury to larynx, necrosis of tissue. No guarantees of treatment were given. A consent form was signed and placed in the patient's medical record. The patient is enthusiastic about proceeding with treatment. I look forward to participating in the patient's care.    Simulation (treatment planning) will take place in the near future  We also discussed that the treatment of head and neck cancer is a multidisciplinary process to maximize treatment outcomes and quality of life. For this reason the following referrals will be made:   Nutritionist for nutrition support during and after treatment.   Speech language pathology for swallowing and/or speech therapy.   Social work for social support.    Baseline labs including TSH.  This encounter was provided by telemedicine platform by telephone as patient was unable to access MyChart video during pandemic precautions The patient has given verbal consent for this type of encounter and has been advised to only accept a meeting of this type in a secure network environment. The time spent during this encounter was 25 minutes. The attendants for this meeting include Eppie Gibson  and  [employer].  During the encounter, Eppie Gibson was located at [facility] Radiation Oncology Department.  Jason Avila was located at home.   __________________________________________   Eppie Gibson, MD

## 2019-11-10 ENCOUNTER — Other Ambulatory Visit: Payer: Self-pay | Admitting: Radiation Oncology

## 2019-11-10 ENCOUNTER — Telehealth: Payer: Self-pay | Admitting: Nutrition

## 2019-11-10 DIAGNOSIS — C320 Malignant neoplasm of glottis: Secondary | ICD-10-CM

## 2019-11-10 DIAGNOSIS — Z1329 Encounter for screening for other suspected endocrine disorder: Secondary | ICD-10-CM

## 2019-11-10 DIAGNOSIS — R634 Abnormal weight loss: Secondary | ICD-10-CM

## 2019-11-10 NOTE — Progress Notes (Signed)
Oncology Nurse Navigator Documentation  Joined Jason Avila and his wife during Telemedicine consult with Dr. Isidore Moos. They confirmed intent to travel from [place] for [employer] at [facility]. They voiced understanding of tmt options of 6 weeks vs 4 weeks RT, same dose. They voiced understanding CT SIM to be scheduled next available, they requested late morning appt to accommodate travel. I confirmed they have my contact information, encouraged them to call with questions.  Gayleen Orem, RN, BSN Head & Neck Oncology Nurse [place] at [place] [phone]

## 2019-11-10 NOTE — Telephone Encounter (Signed)
Scheduled appt per 11/24 sch message - wife is aware of appt for nutritionist

## 2019-11-11 ENCOUNTER — Telehealth: Payer: Self-pay | Admitting: *Deleted

## 2019-11-11 NOTE — Telephone Encounter (Signed)
CALLED PATIENT TO INFORM OF LAB FOR 11-16-19 @ 2:15 PM, SPOKE WITH PATIENT AND HE IS AWARE OF THIS LAB

## 2019-11-16 ENCOUNTER — Ambulatory Visit
Admission: RE | Admit: 2019-11-16 | Discharge: 2019-11-16 | Disposition: A | Discharge status: Home / Self Care | Payer: Medicare Other | Source: Ambulatory Visit | Attending: Radiation Oncology | Admitting: Radiation Oncology

## 2019-11-16 ENCOUNTER — Encounter: Payer: Self-pay | Admitting: *Deleted

## 2019-11-16 ENCOUNTER — Other Ambulatory Visit: Payer: Self-pay

## 2019-11-16 DIAGNOSIS — E785 Hyperlipidemia, unspecified: Secondary | ICD-10-CM | POA: Diagnosis not present

## 2019-11-16 DIAGNOSIS — I10 Essential (primary) hypertension: Secondary | ICD-10-CM | POA: Diagnosis not present

## 2019-11-16 DIAGNOSIS — C320 Malignant neoplasm of glottis: Secondary | ICD-10-CM

## 2019-11-16 DIAGNOSIS — R634 Abnormal weight loss: Secondary | ICD-10-CM

## 2019-11-16 DIAGNOSIS — Z1329 Encounter for screening for other suspected endocrine disorder: Secondary | ICD-10-CM

## 2019-11-16 LAB — TSH: TSH: 1.791 u[IU]/mL (ref 0.320–4.118)

## 2019-11-17 DIAGNOSIS — C320 Malignant neoplasm of glottis: Secondary | ICD-10-CM | POA: Diagnosis not present

## 2019-11-17 DIAGNOSIS — E785 Hyperlipidemia, unspecified: Secondary | ICD-10-CM | POA: Diagnosis not present

## 2019-11-17 DIAGNOSIS — I10 Essential (primary) hypertension: Secondary | ICD-10-CM | POA: Diagnosis not present

## 2019-11-17 NOTE — Progress Notes (Signed)
  Radiation Oncology         [phone] ________________________________  Name: Jason Avila MRN: [id number]  Date: 11/16/2019  DOB: Mar 02, 1933  SIMULATION AND TREATMENT PLANNING NOTE  Outpatient    ICD-10-CM   1. 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  Identity was confirmed.  All relevant records and images related to the planned course of therapy were reviewed.  The patient freely provided informed written consent to proceed with treatment after reviewing the details related to the planned course of therapy. The consent form was witnessed and verified by the simulation staff.    Then, the patient was set-up in a stable reproducible supine position for radiation therapy.  Aquaplast head and should mask was custom fabricated for immobilization.  CT images were obtained without contrast.  Surface markings were placed.  The CT images were loaded into the planning software.    TREATMENT PLANNING NOTE: Treatment planning then occurred.  The radiation prescription was entered and confirmed.    A total of 3 medically necessary complex treatment devices were fabricated and supervised by me (2 wedges for the opposed fields and the Aquaplast head and shoulder mask). I have requested : 3D Simulation  I have requested a DVH of the following structures: target volume, esophagus, spinal cord.  I have ordered:Nutrition Consult  The patient will receive 54 Gy in 20 fractions to the glottic larynx.  -----------------------------------  Eppie Gibson, MD

## 2019-11-18 ENCOUNTER — Ambulatory Visit
Admission: RE | Admit: 2019-11-18 | Discharge: 2019-11-18 | Disposition: A | Discharge status: Home / Self Care | Payer: Medicare Other | Source: Ambulatory Visit | Attending: Radiation Oncology | Admitting: Radiation Oncology

## 2019-11-18 ENCOUNTER — Other Ambulatory Visit: Payer: Self-pay

## 2019-11-18 ENCOUNTER — Telehealth: Payer: Self-pay | Admitting: *Deleted

## 2019-11-18 DIAGNOSIS — C320 Malignant neoplasm of glottis: Secondary | ICD-10-CM | POA: Diagnosis not present

## 2019-11-18 NOTE — Telephone Encounter (Signed)
Oncology Nurse Navigator Documentation  Called pt's wife in follow-up to his New Start to review New Patient Packet, to confirm 10:30 [place] arrival for tomorrow morning's MDC to meet with SLP at 11:00 after which he will proceed to RT.  Gayleen Orem, RN, BSN Head & Neck Oncology Nurse [place] at [place] [phone]

## 2019-11-19 ENCOUNTER — Encounter: Payer: Self-pay | Admitting: *Deleted

## 2019-11-19 ENCOUNTER — Ambulatory Visit
Admission: RE | Admit: 2019-11-19 | Discharge: 2019-11-19 | Disposition: A | Discharge status: Home / Self Care | Payer: Medicare Other | Source: Ambulatory Visit | Attending: Radiation Oncology | Admitting: Radiation Oncology

## 2019-11-19 ENCOUNTER — Ambulatory Visit: Payer: Medicare Other | Attending: Radiation Oncology

## 2019-11-19 ENCOUNTER — Other Ambulatory Visit: Payer: Self-pay

## 2019-11-19 DIAGNOSIS — C320 Malignant neoplasm of glottis: Secondary | ICD-10-CM

## 2019-11-19 DIAGNOSIS — R490 Dysphonia: Secondary | ICD-10-CM

## 2019-11-19 DIAGNOSIS — R1310 Dysphagia, unspecified: Secondary | ICD-10-CM

## 2019-11-19 MED ORDER — SONAFINE EX EMUL
1.0000 "application " | Freq: Once | CUTANEOUS | Status: AC
  Administered 2019-11-19: 1 via TOPICAL

## 2019-11-19 NOTE — Progress Notes (Signed)

## 2019-11-19 NOTE — Patient Instructions (Signed)
SWALLOWING EXERCISES Do these 6 of the 7 days per week until 6 months after your last day of radiation, then 2 times per week afterwards  1. Effortful Swallows - Press your tongue against the roof of your mouth for 3 seconds, then squeeze          the muscles in your neck while you swallow your saliva or a sip of water - Repeat 20 times, 2-3 times a day, and use whenever you eat or drink  2. Pitch Raise - Repeat "he", once per second in as high of a pitch as you can - Repeat 20 times, 2-3 times a day  3. Breath Hold - Say "HUH!" loudly, then hold your breath for 3 seconds at your voice box - Repeat 20 times, 2-3 times a day         4.  Siren exercise  Say "eeee" in as low of a pitch as you can and glide up to as high of a pitch as you can, then glide back down as low as you can  Repeat 10 times         5. Super swallow  - take a breath and hold it  - bear down like you are pushing your bowels  - swallow and immediately cough

## 2019-11-19 NOTE — Therapy (Signed)
[facility] [address] [place], [place], [address] Phone: [phone]   Fax:  [phone]  Speech Language Pathology Evaluation  Patient Details  Name: Jason Avila MRN: [id number] Date of Birth: 08/06/1933 Referring Provider (SLP): Eppie Gibson, MD   Encounter Date: 11/19/2019  End of Session - 11/19/19 1314    Visit Number  1    Number of Visits  4    Date for SLP Re-Evaluation  02/17/20    SLP Start Time  1218    SLP Stop Time   1252    SLP Time Calculation (min)  34 min    Activity Tolerance  Patient tolerated treatment well       Past Medical History:  Diagnosis Date  . Anemia   . Aortic valve stenosis    mild AS 04/2017 (followed by Dr. Cleora Fleet, [place])  . Bilateral hearing loss    bilateral hearing aids  . Cancer ([place]) 2018   basal cell carcinoma on nose  . Chronic kidney disease    ckd stage 4, bilateral cysts on kidneys [facility])  . Dysrhythmia    RBBB  . GERD (gastroesophageal reflux disease)   . Gout    left foot  . Heart murmur    no problems  . History of blood transfusion 2018  . Hyperlipidemia   . Hypertension   . Sleep apnea   . Subdural hematoma ([place]) 11/2018  . Wears glasses     Past Surgical History:  Procedure Laterality Date  . BURR HOLE FOR SUBDURAL HEMATOMA  12/15/2018   brain  . COLONOSCOPY    . EYE SURGERY Bilateral    cataracts removed  . MICROLARYNGOSCOPY N/A 10/23/2019   Procedure: Microlaryngoscopy with vocal cord lesion biopsy;  Surgeon: Jerrell Belfast, MD;  Location: [facility];  Service: ENT;  Laterality: N/A;  . UPPER GI ENDOSCOPY      There were no vitals filed for this visit.  Subjective Assessment - 11/19/19 1224    Subjective  Pt denies any difficulty when directly asked, with swallowing during meals/liquids.    Currently in Pain?  No/denies         SLP Evaluation [facility] - 11/19/19 1315      SLP Visit Information   SLP  Received On  11/19/19    Referring Provider (SLP)  Eppie Gibson, MD    Onset Date  2 year difficulty with hoarseness    Medical Diagnosis  lt vocal cord SCC      Pain Assessment   Currently in Pain?  No/denies      General Information   HPI  Pt presented with hoarseness x2 years to Dr. Wilburn Cornelia who did laryngoscopy showing lesion on lt vocal fold extending to anterior commisure. Pt began rad tx yesterday - 20 fxt to larynx.       Prior Functional Status   Cognitive/Linguistic Baseline  Within functional limits      Cognition   Overall Cognitive Status  Within Functional Limits for tasks assessed      Oral Motor/Sensory Function   Overall Oral Motor/Sensory Function  Appears within functional limits for tasks assessed      Motor Speech   Overall Motor Speech  Appears within functional limits for tasks assessed    Phonation  Hoarse        Pt currently tolerates regular diet and thin liquids. Voice mod hoarse.   Because data states the risk for dysphagia during and after  radiation treatment is high due to undergoing radiation tx, SLP taught pt about the possibility of reduced/limited ability for PO intake during rad tx. SLP encouraged pt to continue swallowing POs as far into rad tx as possible, even ingesting POs and/or completing HEP shortly after administration of pain meds.   SLP educated pt re: changes to swallowing musculature after rad tx, and why adherence to dysphagia HEP provided today and PO consumption was necessary to inhibit muscular disuse atrophy and to reduce muscle fibrosis following rad tx. Pt demonstrated understanding of these things to SLP.    SLP then developed a HEP for pt and pt was instructed how to perform exercises involving lingual, vocal, and pharyngeal strengthening. SLP performed each exercise and pt return demonstrated each exercise. SLP ensured pt performance was correct prior to moving on to next exercise. Pt was instructed to complete this program 2  times a day, 6-7 days/week until 6 months after his last rad tx, then x2 a week after that.                SLP Education - 11/19/19 1313    Education Details  HEP procedure, late effects head/neck radiaiton on swallowing ability    Person(s) Educated  Patient    Methods  Explanation;Demonstration;Verbal cues;Handout    Comprehension  Need further instruction;Verbal cues required;Returned demonstration;Verbalized understanding       SLP Short Term Goals - 11/19/19 1316      SLP SHORT TERM GOAL #1   Title  Pt will complete HEP with rare min A over two sessions    Time  2    Period  --   sessions, for all STGs   Status  New      SLP SHORT TERM GOAL #2   Title  Pt will tell SLP 3 overt s/s aspiration PNA with modified independence    Time  2    Status  New      SLP SHORT TERM GOAL #3   Title  Pt will tell SLP rationale for HEP completion    Time  2    Status  New       SLP Long Term Goals - 11/19/19 1323      SLP LONG TERM GOAL #1   Title  Pt will complete HEP with modified independence over 3 visits    Time  4    Status  New      SLP LONG TERM GOAL #2   Title  Pt will tell SLP rationale for HEP completion over 3 sessions    Time  3    Period  --   (session #4)   Status  New      SLP LONG TERM GOAL #3   Title  Pt will tell SLP when to decr frequency of HEP over two sessions    Time  6    Status  New       Plan - 11/19/19 1315    Clinical Impression Statement  Pt presents today with mod hoarseness and with WNL swallowing ability pre-rad tx. No overt s/sx aspiration PNA reported or observed today. Data suggests that as pts progress through rad or chemorad therapy that their swallowing ability will decrease. Also, WNL swallowing is threatened by muscle fibrosis that will likely develop after rad is completed. Therefore, skilled ST would be beneficial to the pt in order to regularly assess pt's safety with POs and/or need for instrumental swallow assessment,  as well as  to assess proper completion of HEP.    Speech Therapy Frequency  --   once approx every four weeks   Duration  --   3-4 visits over 90 days   Treatment/Interventions  Aspiration precaution training;Pharyngeal strengthening exercises;Trials of upgraded texture/liquids;Diet toleration management by SLP;Internal/external aids;Patient/family education;Compensatory strategies;SLP instruction and feedback    Potential to Achieve Goals  Good       Patient will benefit from skilled therapeutic intervention in order to improve the following deficits and impairments:   Dysphagia, unspecified type - Plan: SLP plan of care cert/re-cert  Hoarseness - Plan: SLP plan of care cert/re-cert    Problem List Patient Active Problem List   Diagnosis Date Noted  . 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 Carotid bruit 10/06/2018  . Disorder of shoulder 10/06/2018  . Heart murmur 10/06/2018  . Hyperglycemia 10/06/2018  . Polyp of colon 10/06/2018  . Right bundle branch block 10/06/2018  . Syncope 10/06/2018  . Iron deficiency anemia 03/04/2018  . 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 Pancreatic cyst 06/10/2017  . Abnormal CT of the abdomen 06/06/2017  . Urge incontinence of urine 03/12/2017  . Kidney disease 03/12/2017  . Personal history of malignant neoplasm of skin 12/18/2016  . Multiple congenital cysts of kidney 09/19/2016  . Aortic valve stenosis 09/13/2016  . Basal cell carcinoma of skin 09/13/2016  . Bilateral hearing loss 08/14/2016  . Diverticular disease 08/14/2016  . Essential hypertension 02/09/2015  . Hyperlipidemia 02/09/2015  . Multiple nevi 08/20/2012    [facility] ,MS, CCC-SLP  11/19/2019, 1:31 PM  [facility] [address] [place], [place], [address] Phone: [phone]   Fax:  [phone]  Name: Jason Avila MRN: [id number] Date of Birth:  1933-07-29

## 2019-11-20 ENCOUNTER — Other Ambulatory Visit: Payer: Self-pay

## 2019-11-20 ENCOUNTER — Ambulatory Visit
Admission: RE | Admit: 2019-11-20 | Discharge: 2019-11-20 | Disposition: A | Discharge status: Home / Self Care | Payer: Medicare Other | Source: Ambulatory Visit | Attending: Radiation Oncology | Admitting: Radiation Oncology

## 2019-11-20 DIAGNOSIS — C320 Malignant neoplasm of glottis: Secondary | ICD-10-CM | POA: Diagnosis not present

## 2019-11-23 ENCOUNTER — Telehealth: Payer: Self-pay | Admitting: Nutrition

## 2019-11-23 ENCOUNTER — Other Ambulatory Visit: Payer: Self-pay

## 2019-11-23 ENCOUNTER — Ambulatory Visit
Admission: RE | Admit: 2019-11-23 | Discharge: 2019-11-23 | Disposition: A | Discharge status: Home / Self Care | Payer: Medicare Other | Source: Ambulatory Visit | Attending: Radiation Oncology | Admitting: Radiation Oncology

## 2019-11-23 ENCOUNTER — Other Ambulatory Visit: Payer: Self-pay | Admitting: Radiation Oncology

## 2019-11-23 DIAGNOSIS — C320 Malignant neoplasm of glottis: Secondary | ICD-10-CM

## 2019-11-23 MED ORDER — LIDOCAINE VISCOUS HCL 2 % MT SOLN: OROMUCOSAL | 4 refills | Status: DC

## 2019-11-23 NOTE — Telephone Encounter (Signed)
Left message for patient to verify telephone visit for pre reg °

## 2019-11-23 NOTE — Progress Notes (Signed)
Oncology Nurse Navigator Documentation  To provide support, encouragement and care continuity, met with Mr. Buchheit during his CT SIM. He was accompanied by his wife.   He tolerated procedure without difficulty, denied questions/concerns.   I toured him to [facility] 3 treatment area, explained procedures for lobby registration, arrival to Radiation Waiting, arrival to tmt area and preparation for tmt.  He voiced understanding.   Escorted him to [facility] entrance where he was met by his wife. I encouraged them to call me prior to 12/2 New [facility] with questions/concerns.  Gayleen Orem, RN, BSN Head & Neck Oncology Nurse [place] at [place] [phone]

## 2019-11-23 NOTE — Progress Notes (Signed)
Oncology Nurse Navigator Documentation  Met with Mr. Falkenstein upon his arrival for H&N MDC.    Provided verbal and written overview of MDC, the clinicians who will be seeing him, encouraged him to ask questions during his time with them.  He was seen by SLP Carl Schinke following RT. Provided New Patient Information packet, discussed contents: Contact information for physician(s), myself, other members of the Care Team. Advance Directive information ([facility] blue pamphlet with LCSW contact info); provided [facility] AD booklet at his request, encouraged him to contact Lauren Somers LCSW to complete. Fall Prevention Patient Safety Plan Financial Assistance Information sheet [facility] map with highlight of [facility] SLP information sheet Symptom Management Clinic information Escorted him to [facility] entrance where he was met by wife.    Rick Diehl, RN, BSN Head & Neck Oncology Navigator  [facility] [place] [phone]  

## 2019-11-24 ENCOUNTER — Ambulatory Visit
Admission: RE | Admit: 2019-11-24 | Discharge: 2019-11-24 | Disposition: A | Discharge status: Home / Self Care | Payer: Medicare Other | Source: Ambulatory Visit | Attending: Radiation Oncology | Admitting: Radiation Oncology

## 2019-11-24 ENCOUNTER — Other Ambulatory Visit: Payer: Self-pay

## 2019-11-24 ENCOUNTER — Inpatient Hospital Stay: Payer: Medicare Other | Attending: Radiation Oncology | Admitting: Nutrition

## 2019-11-24 DIAGNOSIS — C320 Malignant neoplasm of glottis: Secondary | ICD-10-CM | POA: Diagnosis not present

## 2019-11-24 NOTE — Progress Notes (Signed)
RD working remotely.  85 year old male diagnosed with Glottis cancer. He is a patient of Dr. Isidore Moos. He will receive 20 fxn.  PMH includes HTN, HLD, Gout, GERD, CKD, Bilateral hearing loss.  Medications include Lipitor, Vitamin D3, Colace, Ferrous Sulfate  Labs reviewed.  Height: 5'11". Weight: 170 pounds. BMI: 23.71  Patient denies nutrition impact symptoms.  Nutrition Diagnosis: Food and Nutrition related knowledge deficit related to glottis cancer and associated treatments as evidenced by no prior need for nutrition related information  Intervention: Educated to consume small, frequent meals and snacks with adequate calories and protein for weight maintenance. Alter textures and temperatures of foods as needed. Add ONS if needed. Questions answered. Teach back method used. Contact information provided. Fact sheets given.  Monitoring, Evaluation, Goals: Patient will tolerate adequate calories and protein for weight maintenance and healing.  Next Visit: Patient will contact me with questions or concerns.

## 2019-11-25 ENCOUNTER — Ambulatory Visit
Admission: RE | Admit: 2019-11-25 | Discharge: 2019-11-25 | Disposition: A | Discharge status: Home / Self Care | Payer: Medicare Other | Source: Ambulatory Visit | Attending: Radiation Oncology | Admitting: Radiation Oncology

## 2019-11-25 ENCOUNTER — Other Ambulatory Visit: Payer: Self-pay

## 2019-11-25 DIAGNOSIS — C320 Malignant neoplasm of glottis: Secondary | ICD-10-CM | POA: Diagnosis not present

## 2019-11-26 ENCOUNTER — Other Ambulatory Visit: Payer: Self-pay

## 2019-11-26 ENCOUNTER — Ambulatory Visit
Admission: RE | Admit: 2019-11-26 | Discharge: 2019-11-26 | Disposition: A | Discharge status: Home / Self Care | Payer: Medicare Other | Source: Ambulatory Visit | Attending: Radiation Oncology | Admitting: Radiation Oncology

## 2019-11-26 DIAGNOSIS — C320 Malignant neoplasm of glottis: Secondary | ICD-10-CM | POA: Diagnosis not present

## 2019-11-27 ENCOUNTER — Ambulatory Visit
Admission: RE | Admit: 2019-11-27 | Discharge: 2019-11-27 | Disposition: A | Discharge status: Home / Self Care | Payer: Medicare Other | Source: Ambulatory Visit | Attending: Radiation Oncology | Admitting: Radiation Oncology

## 2019-11-27 ENCOUNTER — Other Ambulatory Visit: Payer: Self-pay

## 2019-11-27 DIAGNOSIS — C320 Malignant neoplasm of glottis: Secondary | ICD-10-CM | POA: Diagnosis not present

## 2019-11-30 ENCOUNTER — Ambulatory Visit
Admission: RE | Admit: 2019-11-30 | Discharge: 2019-11-30 | Disposition: A | Discharge status: Home / Self Care | Payer: Medicare Other | Source: Ambulatory Visit | Attending: Radiation Oncology | Admitting: Radiation Oncology

## 2019-11-30 ENCOUNTER — Other Ambulatory Visit: Payer: Self-pay

## 2019-11-30 DIAGNOSIS — C320 Malignant neoplasm of glottis: Secondary | ICD-10-CM | POA: Diagnosis not present

## 2019-12-01 ENCOUNTER — Ambulatory Visit
Admission: RE | Admit: 2019-12-01 | Discharge: 2019-12-01 | Disposition: A | Discharge status: Home / Self Care | Payer: Medicare Other | Source: Ambulatory Visit | Attending: Radiation Oncology | Admitting: Radiation Oncology

## 2019-12-01 ENCOUNTER — Other Ambulatory Visit: Payer: Self-pay

## 2019-12-01 DIAGNOSIS — C320 Malignant neoplasm of glottis: Secondary | ICD-10-CM | POA: Diagnosis not present

## 2019-12-02 ENCOUNTER — Ambulatory Visit
Admission: RE | Admit: 2019-12-02 | Discharge: 2019-12-02 | Disposition: A | Discharge status: Home / Self Care | Payer: Medicare Other | Source: Ambulatory Visit | Attending: Radiation Oncology | Admitting: Radiation Oncology

## 2019-12-02 ENCOUNTER — Other Ambulatory Visit: Payer: Self-pay

## 2019-12-02 DIAGNOSIS — C320 Malignant neoplasm of glottis: Secondary | ICD-10-CM | POA: Diagnosis not present

## 2019-12-03 ENCOUNTER — Other Ambulatory Visit: Payer: Self-pay

## 2019-12-03 ENCOUNTER — Ambulatory Visit
Admission: RE | Admit: 2019-12-03 | Discharge: 2019-12-03 | Disposition: A | Discharge status: Home / Self Care | Payer: Medicare Other | Source: Ambulatory Visit | Attending: Radiation Oncology | Admitting: Radiation Oncology

## 2019-12-03 DIAGNOSIS — C320 Malignant neoplasm of glottis: Secondary | ICD-10-CM | POA: Diagnosis not present

## 2019-12-04 ENCOUNTER — Ambulatory Visit
Admission: RE | Admit: 2019-12-04 | Discharge: 2019-12-04 | Disposition: A | Discharge status: Home / Self Care | Payer: Medicare Other | Source: Ambulatory Visit | Attending: Radiation Oncology | Admitting: Radiation Oncology

## 2019-12-04 ENCOUNTER — Other Ambulatory Visit: Payer: Self-pay

## 2019-12-04 DIAGNOSIS — C320 Malignant neoplasm of glottis: Secondary | ICD-10-CM | POA: Diagnosis not present

## 2019-12-07 ENCOUNTER — Other Ambulatory Visit: Payer: Self-pay

## 2019-12-07 ENCOUNTER — Ambulatory Visit
Admission: RE | Admit: 2019-12-07 | Discharge: 2019-12-07 | Disposition: A | Discharge status: Home / Self Care | Payer: Medicare Other | Source: Ambulatory Visit | Attending: Radiation Oncology | Admitting: Radiation Oncology

## 2019-12-07 DIAGNOSIS — C320 Malignant neoplasm of glottis: Secondary | ICD-10-CM | POA: Diagnosis not present

## 2019-12-08 ENCOUNTER — Ambulatory Visit
Admission: RE | Admit: 2019-12-08 | Discharge: 2019-12-08 | Disposition: A | Discharge status: Home / Self Care | Payer: Medicare Other | Source: Ambulatory Visit | Attending: Radiation Oncology | Admitting: Radiation Oncology

## 2019-12-08 ENCOUNTER — Other Ambulatory Visit: Payer: Self-pay

## 2019-12-08 DIAGNOSIS — C320 Malignant neoplasm of glottis: Secondary | ICD-10-CM | POA: Diagnosis not present

## 2019-12-09 ENCOUNTER — Ambulatory Visit
Admission: RE | Admit: 2019-12-09 | Discharge: 2019-12-09 | Disposition: A | Discharge status: Home / Self Care | Payer: Medicare Other | Source: Ambulatory Visit | Attending: Radiation Oncology | Admitting: Radiation Oncology

## 2019-12-09 ENCOUNTER — Other Ambulatory Visit: Payer: Self-pay

## 2019-12-09 DIAGNOSIS — C320 Malignant neoplasm of glottis: Secondary | ICD-10-CM | POA: Diagnosis not present

## 2019-12-10 ENCOUNTER — Ambulatory Visit
Admission: RE | Admit: 2019-12-10 | Discharge: 2019-12-10 | Disposition: A | Discharge status: Home / Self Care | Payer: Medicare Other | Source: Ambulatory Visit | Attending: Radiation Oncology | Admitting: Radiation Oncology

## 2019-12-10 ENCOUNTER — Other Ambulatory Visit: Payer: Self-pay

## 2019-12-10 DIAGNOSIS — C320 Malignant neoplasm of glottis: Secondary | ICD-10-CM | POA: Diagnosis not present

## 2019-12-14 ENCOUNTER — Ambulatory Visit
Admission: RE | Admit: 2019-12-14 | Discharge: 2019-12-14 | Disposition: A | Discharge status: Home / Self Care | Payer: Medicare Other | Source: Ambulatory Visit | Attending: Radiation Oncology | Admitting: Radiation Oncology

## 2019-12-14 ENCOUNTER — Other Ambulatory Visit: Payer: Self-pay

## 2019-12-14 DIAGNOSIS — C320 Malignant neoplasm of glottis: Secondary | ICD-10-CM | POA: Diagnosis not present

## 2019-12-15 ENCOUNTER — Other Ambulatory Visit: Payer: Self-pay

## 2019-12-15 ENCOUNTER — Ambulatory Visit
Admission: RE | Admit: 2019-12-15 | Discharge: 2019-12-15 | Disposition: A | Discharge status: Home / Self Care | Payer: Medicare Other | Source: Ambulatory Visit | Attending: Radiation Oncology | Admitting: Radiation Oncology

## 2019-12-15 DIAGNOSIS — C320 Malignant neoplasm of glottis: Secondary | ICD-10-CM | POA: Diagnosis not present

## 2019-12-16 ENCOUNTER — Encounter: Payer: Self-pay | Admitting: Radiation Oncology

## 2019-12-16 ENCOUNTER — Ambulatory Visit
Admission: RE | Admit: 2019-12-16 | Discharge: 2019-12-16 | Disposition: A | Discharge status: Home / Self Care | Payer: Medicare Other | Source: Ambulatory Visit | Attending: Radiation Oncology | Admitting: Radiation Oncology

## 2019-12-16 ENCOUNTER — Other Ambulatory Visit: Payer: Self-pay

## 2019-12-16 DIAGNOSIS — C320 Malignant neoplasm of glottis: Secondary | ICD-10-CM | POA: Diagnosis not present

## 2019-12-31 NOTE — Progress Notes (Signed)
  Patient Name: Jason Avila MRN: [id number] DOB: September 26, 1933 Referring Physician: Wilburn Cornelia DAVID (Profile Not Attached) Date of Service: 12/16/2019 [facility], [place]                                                        End Of Treatment Note  Diagnoses: C32.0-Malignant neoplasm of glottis  Cancer Staging Carcinoma of glottis [facility]) Staging form: Larynx - Glottis, AJCC 8th Edition - Clinical stage from 11/09/2019: Stage 0 (cTis, cN0, cM0) - Signed by Eppie Gibson, MD on 11/10/2019  Intent: Curative  Radiation Treatment Dates: 11/18/2019 through 12/16/2019 Site Technique Total Dose (Gy) Dose per Fx (Gy) Completed Fx Beam Energies  Larynx: HN_larynx 3D 54/54 2.7 20/20 6X   Narrative: The patient tolerated radiation therapy relatively well. Towards the end of treatment, he reported throat pain with swallowing that was somewhat relieved with lidocaine rinses. He also reported decreased appetite. He endorsed eating softer foods and supplementing with Ensure.   Plan: The patient will follow-up with radiation oncology in 2 weeks.  ________________________________________________   Eppie Gibson, MD  This document serves as a record of services personally performed by Eppie Gibson, MD. It was created on her behalf by Wilburn Mylar, a trained medical scribe. The creation of this record is based on the scribe's personal observations and the provider's statements to them. This document has been checked and approved by the attending provider.

## 2019-12-31 NOTE — Progress Notes (Signed)
Radiation Oncology         [phone] ________________________________  Name: Jason Avila MRN: [id number]  Date: 01/01/2020  DOB: 04/25/1933  Follow-Up Visit Note by MyChart video during pandemic precautions   CC: Pomposini, Cherly Anderson, MD  No ref. provider found  Diagnosis and Prior Radiotherapy:       ICD-10-CM   1. Screening for hypothyroidism  Z13.29 TSH  2. 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  He is feeling well.  He is gaining weight back.  Swallowing well.  Skin is healing.  Using lotion on neck and knows to continue to do this for 2 months.  No concerns or complaints.  ALLERGIES:  has No Known Allergies.  Meds: Current Outpatient Medications  Medication Sig Dispense Refill  . amLODipine (NORVASC) 5 MG tablet Take 5 mg by mouth daily.     Marland Kitchen atorvastatin (LIPITOR) 20 MG tablet Take 20 mg by mouth daily at 6 PM.     . Cholecalciferol (VITAMIN D3) 50 MCG (2000 UT) TABS Take 2,000 Units by mouth daily.     Marland Kitchen COLACE 100 MG capsule Take 100 mg by mouth every other day.    . ferrous sulfate 325 (65 FE) MG tablet Take 325 mg by mouth daily with breakfast.     . hydrALAZINE (APRESOLINE) 50 MG tablet Take 50 mg by mouth 3 (three) times daily.     Marland Kitchen lidocaine (XYLOCAINE) 2 % solution Patient: Mix 1part 2% viscous lidocaine, 1part H20. Swallow 43mL of diluted mixture, 5min before meals and at bedtime, up to QID 150 mL 4  . sodium bicarbonate 650 MG tablet Take 650 mg by mouth 2 (two) times daily.      No current facility-administered medications for this encounter.    Physical Findings: The patient is in no acute distress. Patient is alert and oriented. Wt Readings from Last 3 Encounters:  10/23/19 170 lb (77.1 kg)    vitals were not taken for this visit. .  General: Alert and oriented, in no acute distress Psychiatric: Judgment and insight are intact. Affect is appropriate. Skin notable for residual erythema over anterior neck  Lab Findings: Lab Results  Component Value Date   WBC 3.8 (L) 10/23/2019   HGB 10.0 (L) 10/23/2019   HCT 31.5 (L) 10/23/2019   MCV 86.3 10/23/2019   PLT 224 10/23/2019    Lab Results  Component Value Date   TSH 1.791 11/16/2019    Radiographic Findings: No results found.  Impression/Plan:    1) Head and Neck Cancer Status: Healing from radiotherapy  2) Nutritional Status: No concerns, gaining weight back  3) Risk Factors: The patient has been educated about risk factors including alcohol and tobacco abuse; they understand that avoidance of alcohol and tobacco is important to prevent recurrences as well as other cancers  4) Swallowing: Continue SLP recommendations, reports good function  5)  Thyroid function: Will recheck later this year Lab Results  Component Value Date   TSH 1.791 11/16/2019  6) follow-up with Dr. Wilburn Cornelia at end of March; Gayleen Orem, RN, our Head and Neck Oncology Navigator will navigate  Follow-up in radiation oncology in 8 months with annual TSH. The patient was encouraged to call with any issues or questions before then.  This encounter was provided by telemedicine platform MyChart video The patient has given verbal consent for this type of encounter and has been advised to only accept a meeting of this type in a secure network environment. The time spent on date of service in total on this encounter was 25 minutes. The attendants for this meeting include Eppie Gibson  and [employer].  During the  encounter, Eppie Gibson was located at [facility] Radiation Oncology Department.  Jason Avila was located at home.   _____________________________________   Eppie Gibson, MD  This document serves as a record of services personally performed by Eppie Gibson, MD. It was created on her behalf by Wilburn Mylar, a trained medical scribe. The creation of this record is based on the scribe's personal observations and the provider's statements to them. This document has been checked and approved by the attending provider.

## 2020-01-01 ENCOUNTER — Ambulatory Visit: Payer: Medicare Other | Admitting: Radiation Oncology

## 2020-01-01 ENCOUNTER — Encounter: Payer: Self-pay | Admitting: Radiation Oncology

## 2020-01-01 ENCOUNTER — Ambulatory Visit
Admission: RE | Admit: 2020-01-01 | Discharge: 2020-01-01 | Disposition: A | Discharge status: Home / Self Care | Payer: Medicare Other | Source: Ambulatory Visit | Attending: Radiation Oncology | Admitting: Radiation Oncology

## 2020-01-01 ENCOUNTER — Telehealth: Payer: Self-pay | Admitting: *Deleted

## 2020-01-01 DIAGNOSIS — Z1329 Encounter for screening for other suspected endocrine disorder: Secondary | ICD-10-CM

## 2020-01-01 DIAGNOSIS — C320 Malignant neoplasm of glottis: Secondary | ICD-10-CM

## 2020-01-01 HISTORY — DX: Personal history of irradiation: Z92.3

## 2020-01-01 NOTE — Telephone Encounter (Signed)
CALLED PATIENT TO INFORM OF LAB AND FU FOR 08-26-20 (11 AM - LAB) AND FU WITH DR. Isidore Moos ON 08-26-20 @ 11:20 AM WITH DR. Isidore Moos, SPOKE WITH PATIENT'S WIFE AND SHE IS AWARE OF THESE APPTS.

## 2020-01-05 ENCOUNTER — Telehealth: Payer: Self-pay | Admitting: *Deleted

## 2020-01-12 NOTE — Telephone Encounter (Addendum)
Oncology Nurse Navigator Documentation  Per patient's 1/15 post-treatment follow-up with Dr. Isidore Moos, sent fax to [facility] ENT Scheduling with request Mr. Stacey be contacted and scheduled for routine post-RT follow-up with Dr. Wilburn Cornelia the end of March.  Notification of successful fax transmission received.  Addendum:  E-mail reply rec'd 2/3 from [place], indicating appt scheduled for 3/22 11:30.  Gayleen Orem, RN, BSN Head & Neck Oncology Nurse [place] at [place] [phone]

## 2020-03-26 ENCOUNTER — Encounter (HOSPITAL_COMMUNITY): Payer: Self-pay | Admitting: Emergency Medicine

## 2020-03-26 ENCOUNTER — Emergency Department (HOSPITAL_COMMUNITY)
Admission: EM | Admit: 2020-03-26 | Discharge: 2020-03-26 | Disposition: A | Discharge status: Home / Self Care | Payer: Medicare Other | Attending: Emergency Medicine | Admitting: Emergency Medicine

## 2020-03-26 ENCOUNTER — Emergency Department (HOSPITAL_COMMUNITY): Payer: Medicare Other

## 2020-03-26 ENCOUNTER — Other Ambulatory Visit: Payer: Self-pay

## 2020-03-26 DIAGNOSIS — I129 Hypertensive chronic kidney disease with stage 1 through stage 4 chronic kidney disease, or unspecified chronic kidney disease: Secondary | ICD-10-CM | POA: Insufficient documentation

## 2020-03-26 DIAGNOSIS — Z85828 Personal history of other malignant neoplasm of skin: Secondary | ICD-10-CM | POA: Insufficient documentation

## 2020-03-26 DIAGNOSIS — Z79899 Other long term (current) drug therapy: Secondary | ICD-10-CM | POA: Diagnosis not present

## 2020-03-26 DIAGNOSIS — N184 Chronic kidney disease, stage 4 (severe): Secondary | ICD-10-CM | POA: Diagnosis not present

## 2020-03-26 DIAGNOSIS — Z8521 Personal history of malignant neoplasm of larynx: Secondary | ICD-10-CM | POA: Diagnosis not present

## 2020-03-26 DIAGNOSIS — Z87891 Personal history of nicotine dependence: Secondary | ICD-10-CM | POA: Insufficient documentation

## 2020-03-26 DIAGNOSIS — M25511 Pain in right shoulder: Secondary | ICD-10-CM | POA: Diagnosis not present

## 2020-03-26 IMAGING — DX DG SHOULDER 2+V*R*
2 series · 2 of 2 positions shown · non-contrast
Comparison: None.

CLINICAL DATA: Right upper arm pain for 3 days.

EXAM:
RIGHT SHOULDER - 2+ VIEW

[shoulder grashey]
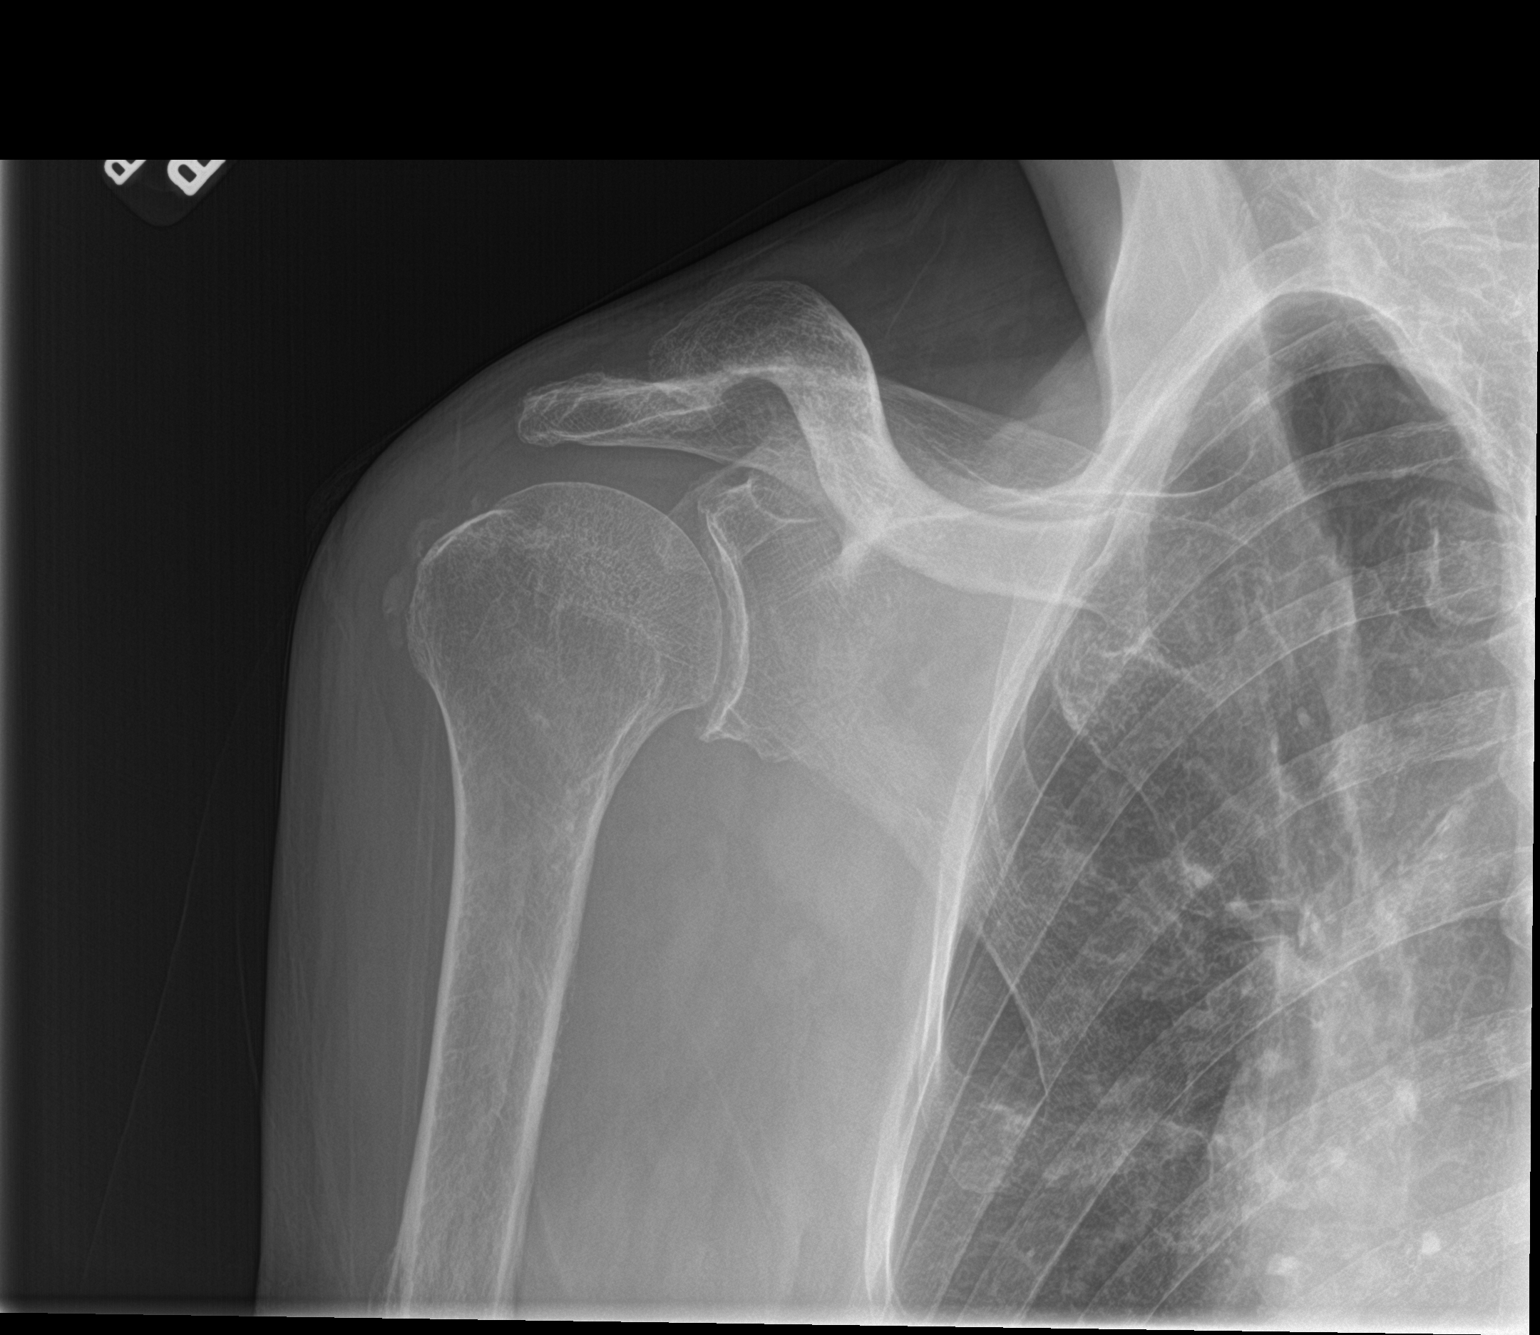

[shoulder y view]
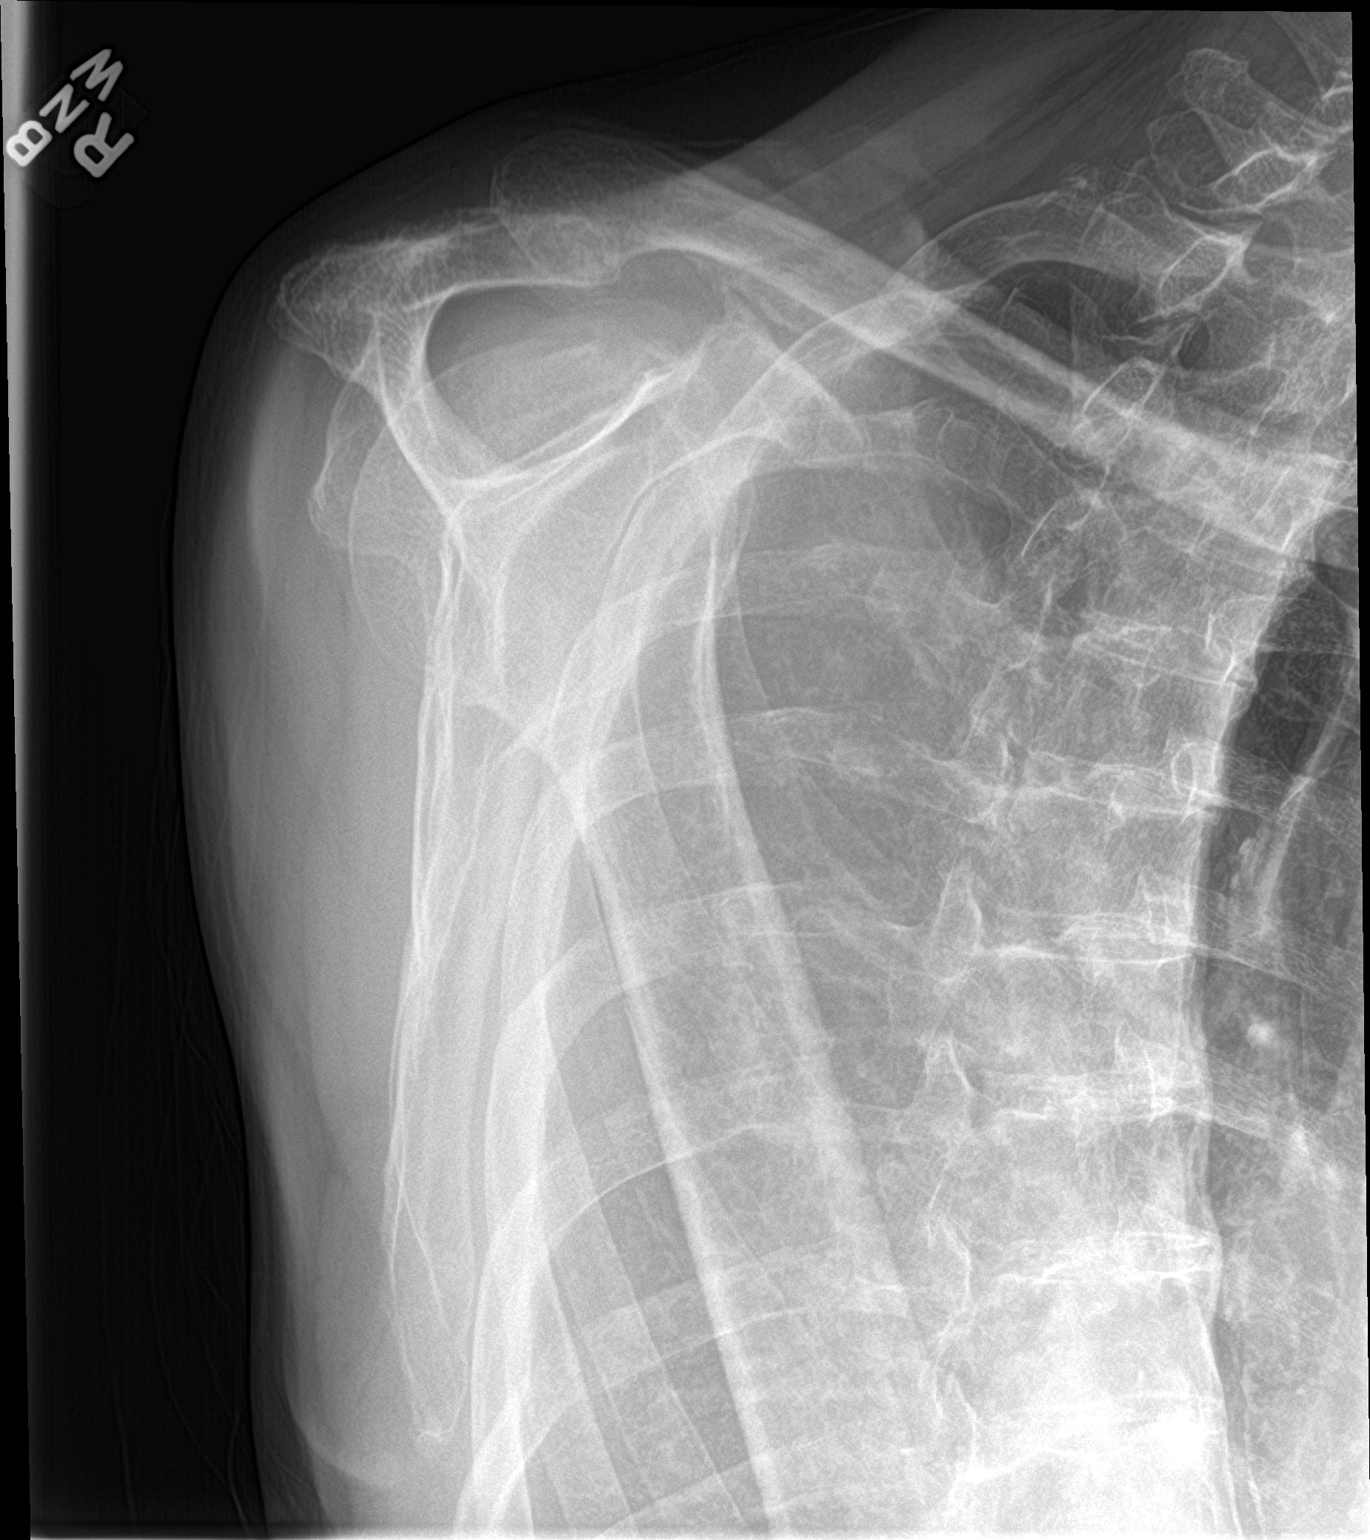

[2 of 2 positions shown; findings below may reference images not displayed]

FINDINGS: No fracture or dislocation. There is density in the soft tissues
along the lateral aspect of the humeral head, possibly calcific
calcific tendinosis. Mild glenohumeral degenerative changes are
identified. Limited views of the right chest are normal. No other
acute abnormalities are identified.
IMPRESSION: Mild glenohumeral degenerative changes.  Calcific tendinosis.

## 2020-03-26 MED ORDER — TRAMADOL HCL 50 MG PO TABS: 50.0000 mg | ORAL_TABLET | Freq: Four times a day (QID) | ORAL | 0 refills | Status: DC | PRN

## 2020-03-26 NOTE — ED Provider Notes (Signed)
[facility] EMERGENCY DEPARTMENT Provider Note   CSN: [id number] Arrival date & time: 03/26/20  0941     History Chief Complaint  Patient presents with  . Arm Pain    Jason Avila is a 86 y.o. male.  Patient status post Procrit injection 3 days ago.  The next morning patient had discomfort in shoulder area.  Where the injection site was.  Patient states no redness to the skin or bruising.  Has had Procrit injections before without any pain in the area.  Patient awoke with pain kind of in the right shoulder area and upper arm.  States is made worse with moving his shoulder and also has some increased pain with full extension of his elbow.  Patient has chronic kidney disease.  Is being prepared to go on dialysis.  Patient's past medical history significant for aortic valve stenosis considered to be mild AS in May 2018.  Patient is from the [place] area.  Patient denies any numbness or weakness to the hand on the right side.        Past Medical History:  Diagnosis Date  . Anemia   . Aortic valve stenosis    mild AS 04/2017 (followed by Dr. Cleora Fleet, [place])  . Bilateral hearing loss    bilateral hearing aids  . Cancer ([place]) 2018   basal cell carcinoma on nose  . Chronic kidney disease    ckd stage 4, bilateral cysts on kidneys [facility])  . Dysrhythmia    RBBB  . GERD (gastroesophageal reflux disease)   . Gout    left foot  . Heart murmur    no problems  . History of blood transfusion 2018  . History of radiation therapy 11/18/19- 12/16/19   Larynx 54 Gy in 20 fx  . Hyperlipidemia   . Hypertension   . Sleep apnea   . Subdural hematoma ([place]) 11/2018  . Wears glasses     Patient Active Problem List   Diagnosis Date Noted  . 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 Carotid bruit 10/06/2018  . Disorder of shoulder 10/06/2018  . Heart murmur 10/06/2018  . Hyperglycemia 10/06/2018  . Polyp of colon 10/06/2018  .  Right bundle branch block 10/06/2018  . Syncope 10/06/2018  . Iron deficiency anemia 03/04/2018  . 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 Pancreatic cyst 06/10/2017  . Abnormal CT of the abdomen 06/06/2017  . Urge incontinence of urine 03/12/2017  . Kidney disease 03/12/2017  . Personal history of malignant neoplasm of skin 12/18/2016  . Multiple congenital cysts of kidney 09/19/2016  . Aortic valve stenosis 09/13/2016  . Basal cell carcinoma of skin 09/13/2016  . Bilateral hearing loss 08/14/2016  . Diverticular disease 08/14/2016  . Essential hypertension 02/09/2015  . Hyperlipidemia 02/09/2015  . Multiple nevi 08/20/2012    Past Surgical History:  Procedure Laterality Date  . BURR HOLE FOR SUBDURAL HEMATOMA  12/15/2018   brain  . COLONOSCOPY    . EYE SURGERY Bilateral    cataracts removed  . MICROLARYNGOSCOPY N/A 10/23/2019   Procedure: Microlaryngoscopy with vocal cord lesion biopsy;  Surgeon: Jerrell Belfast, MD;  Location: [place];  Service: ENT;  Laterality: N/A;  . UPPER GI ENDOSCOPY         History reviewed. No pertinent family history.  Social History   Tobacco Use  . Smoking status: Former Smoker    Years: 40.00    Types: Cigarettes  .  Smokeless tobacco: Never Used  . Tobacco comment: Quit in 1998  Substance Use Topics  . Alcohol use: Never  . Drug use: Never    Home Medications Prior to Admission medications   Medication Sig Start Date End Date Taking? Authorizing Provider  amLODipine (NORVASC) 5 MG tablet Take 5 mg by mouth daily.  07/24/12   [provider]  atorvastatin (LIPITOR) 20 MG tablet Take 20 mg by mouth daily at 6 PM.  07/22/14   [provider]  Cholecalciferol (VITAMIN D3) 50 MCG (2000 UT) TABS Take 2,000 Units by mouth daily.     [provider]  COLACE 100 MG capsule Take 100 mg by mouth every other day. 05/20/19   [provider]  ferrous sulfate 325 (65 FE) MG tablet Take 325 mg  by mouth daily with breakfast.     [provider]  hydrALAZINE (APRESOLINE) 50 MG tablet Take 50 mg by mouth 3 (three) times daily.     [provider]  lidocaine (XYLOCAINE) 2 % solution Patient: Mix 1part 2% viscous lidocaine, 1part H20. Swallow 51mL of diluted mixture, 65min before meals and at bedtime, up to QID 11/23/19   Eppie Gibson, MD  sodium bicarbonate 650 MG tablet Take 650 mg by mouth 2 (two) times daily. 08/04/19   [provider]  traMADol (ULTRAM) 50 MG tablet Take 1 tablet (50 mg total) by mouth every 6 (six) hours as needed for moderate pain. 03/26/20   Fredia Sorrow, MD    Allergies    Patient has no known allergies.  Review of Systems   Review of Systems  Constitutional: Negative for chills and fever.  HENT: Negative for congestion, rhinorrhea and sore throat.   Eyes: Negative for visual disturbance.  Respiratory: Negative for cough and shortness of breath.   Cardiovascular: Negative for chest pain and leg swelling.  Gastrointestinal: Negative for abdominal pain, diarrhea, nausea and vomiting.  Genitourinary: Negative for dysuria.  Musculoskeletal: Negative for back pain and neck pain.  Skin: Negative for rash.  Neurological: Negative for dizziness, light-headedness and headaches.  Hematological: Does not bruise/bleed easily.  Psychiatric/Behavioral: Negative for confusion.    Physical Exam Updated Vital Signs BP (!) 163/73 (BP Location: Left Arm)   Pulse 80   Temp 98.4 F (36.9 C) (Oral)   Resp 16   Ht 1.803 m (5\' 11" )   Wt 77.6 kg   SpO2 97%   BMI 23.85 kg/m   Physical Exam Vitals and nursing note reviewed.  Constitutional:      Appearance: Normal appearance. He is well-developed.  HENT:     Head: Normocephalic and atraumatic.  Eyes:     Extraocular Movements: Extraocular movements intact.     Conjunctiva/sclera: Conjunctivae normal.     Pupils: Pupils are equal, round, and reactive to light.  Cardiovascular:      Rate and Rhythm: Normal rate and regular rhythm.     Heart sounds: No murmur.  Pulmonary:     Effort: Pulmonary effort is normal. No respiratory distress.     Breath sounds: Normal breath sounds.  Abdominal:     Palpations: Abdomen is soft.     Tenderness: There is no abdominal tenderness.  Musculoskeletal:        General: No swelling, tenderness, deformity or signs of injury.     Cervical back: Normal range of motion and neck supple. No rigidity.     Comments: Patient with good range of motion at the wrist fingers and elbow.  Does not want to completely extend the arm at the elbow.  Gets some discomfort in the upper triceps area.  But has more discomfort with any range of motion at the shoulder area.  This is on the right side.  Left side is normal.  Good radial pulse there neurovascularly intact distally.  No redness to the skin no bruising no significant tenderness to palpation.  Skin:    General: Skin is warm and dry.     Capillary Refill: Capillary refill takes less than 2 seconds.  Neurological:     General: No focal deficit present.     Mental Status: He is alert and oriented to person, place, and time.     Cranial Nerves: No cranial nerve deficit.     Sensory: No sensory deficit.     Motor: No weakness.     ED Results / Procedures / Treatments   Labs (all labs ordered are listed, but only abnormal results are displayed) Labs Reviewed - No data to display  EKG None  Radiology DG Shoulder Right  Result Date: 03/26/2020 CLINICAL DATA:  Right upper arm pain for 3 days. EXAM: RIGHT SHOULDER - 2+ VIEW COMPARISON:  None. FINDINGS: No fracture or dislocation. There is density in the soft tissues along the lateral aspect of the humeral head, possibly calcific calcific tendinosis. Mild glenohumeral degenerative changes are identified. Limited views of the right chest are normal. No other acute abnormalities are identified. IMPRESSION: Mild glenohumeral degenerative changes.  Calcific  tendinosis. Electronically Signed   By: Dorise Bullion III M.D   On: 03/26/2020 12:01    Procedures Procedures (including critical care time)  Medications Ordered in ED Medications - No data to display  ED Course  I have reviewed the triage vital signs and the nursing notes.  Pertinent labs & imaging results that were available during my care of the patient were reviewed by me and considered in my medical decision making (see chart for details).    MDM Rules/Calculators/A&P                      Clinically no evidence of any infection redness or bruising at the injection site.  Patient's pain mostly seems to be in the right shoulder area.  But may be some upper triceps discomfort as well.  Good range of motion at the fingers wrist and at the elbow particularly with flexion little bit of discomfort with full extension.  But some of that pain from the upper triceps area radiates towards the shoulder.  Has definite increased discomfort with elevation of the right arm suggestive of rotator cuff injury.  No history of any fall or known injury.  X-rays of the right shoulder show a little bit of degenerative change but otherwise nothing significant.  We will treat with a shoulder immobilizer some mild pain medicine tramadol and follow-up with orthopedics.  The patient's since her in the [employer] area want a follow-up with Dr. Aline Brochure here in the [place] area. Final Clinical Impression(s) / ED Diagnoses Final diagnoses:  Acute pain of right shoulder    Rx / DC Orders ED Discharge Orders         Ordered    traMADol (ULTRAM) 50 MG tablet  Every 6 hours PRN     03/26/20 1232           Fredia Sorrow, MD 03/26/20 1244

## 2020-03-26 NOTE — ED Notes (Signed)
ED Provider at bedside. 

## 2020-03-26 NOTE — Discharge Instructions (Signed)
Wear the shoulder immobilizer for comfort.  Take the tramadol as needed for pain.  Make an appointment to follow-up with orthopedics referral information provided above.  X-rays of the right shoulder showed some mild degenerative changes.  But probably not enough to explain all this pain.  Concern would be for rotator cuff injury.

## 2020-03-26 NOTE — ED Triage Notes (Signed)
PT brought in from home with wife complaining of right upper arm pain x3 days. PT states x4 days ago received a Procrit injection and his hematology office in [place] and they called the office to report the pain and was told it wasn't a side effect of the injection because the patient has had multiple same injections.

## 2020-03-31 ENCOUNTER — Ambulatory Visit (INDEPENDENT_AMBULATORY_CARE_PROVIDER_SITE_OTHER): Payer: Medicare Other | Admitting: Orthopaedic Surgery

## 2020-03-31 ENCOUNTER — Encounter: Payer: Self-pay | Admitting: Orthopaedic Surgery

## 2020-03-31 ENCOUNTER — Other Ambulatory Visit: Payer: Self-pay

## 2020-03-31 VITALS — BP 160/70 | HR 79 | Ht 71.0 in | Wt 178.0 lb

## 2020-03-31 DIAGNOSIS — D638 Anemia in other chronic diseases classified elsewhere: Secondary | ICD-10-CM

## 2020-03-31 DIAGNOSIS — D649 Anemia, unspecified: Secondary | ICD-10-CM | POA: Insufficient documentation

## 2020-03-31 DIAGNOSIS — N184 Chronic kidney disease, stage 4 (severe): Secondary | ICD-10-CM | POA: Diagnosis not present

## 2020-03-31 DIAGNOSIS — M79601 Pain in right arm: Secondary | ICD-10-CM | POA: Diagnosis not present

## 2020-03-31 DIAGNOSIS — N189 Chronic kidney disease, unspecified: Secondary | ICD-10-CM | POA: Insufficient documentation

## 2020-03-31 NOTE — Progress Notes (Signed)
Subjective:    Patient ID: Jason Avila, male    DOB: 08-22-1933, 86 y.o.   MRN: [id number]  HPI He is a renal patient.  He takes ProCrit injections for his blood.  He gets one every few weeks.  He has never had a problem with them.  This past Friday he had his usual injection.  That evening he had pain of the right upper arm where the injection was done.  He did not sleep well.  It got worse and worse.  He went to the ER on 03-26-2020, the next day in the morning.  He had called his doctor before going and was told to go there.  There was no redness but he was in a good deal of pain and had limited use of the right shoulder.  X-rays were done and showed some calcific bursitis and DJD but no fracture.  He was told to follow up with me.  Since then he has gotten better.  His arm never got red or get swelling.  He has had no numbness.  He can use his shoulder now.  He had a sling and has been using that.  Exam is normal today.   Review of Systems  Constitutional: Positive for activity change.  Musculoskeletal: Positive for arthralgias and myalgias.  All other systems reviewed and are negative.  For Review of Systems, all other systems reviewed and are negative.  The following is a summary of the past history medically, past history surgically, known current medicines, social history and family history.  This information is gathered electronically by the computer from prior information and documentation.  I review this each visit and have found including this information at this point in the chart is beneficial and informative.   Past Medical History:  Diagnosis Date  . Anemia   . Aortic valve stenosis    mild AS 04/2017 (followed by Dr. Cleora Fleet, [place])  . Bilateral hearing loss    bilateral hearing aids  . Cancer ([place]) 2018   basal cell carcinoma on nose  . Chronic kidney disease    ckd stage 4, bilateral cysts on kidneys [facility])  . Dysrhythmia    RBBB    . GERD (gastroesophageal reflux disease)   . Gout    left foot  . Heart murmur    no problems  . History of blood transfusion 2018  . History of radiation therapy 11/18/19- 12/16/19   Larynx 54 Gy in 20 fx  . Hyperlipidemia   . Hypertension   . Sleep apnea   . Subdural hematoma ([place]) 11/2018  . Wears glasses     Past Surgical History:  Procedure Laterality Date  . BURR HOLE FOR SUBDURAL HEMATOMA  12/15/2018   brain  . COLONOSCOPY    . EYE SURGERY Bilateral    cataracts removed  . MICROLARYNGOSCOPY N/A 10/23/2019   Procedure: Microlaryngoscopy with vocal cord lesion biopsy;  Surgeon: Jerrell Belfast, MD;  Location: [place];  Service: ENT;  Laterality: N/A;  . UPPER GI ENDOSCOPY      Current Outpatient Medications on File Prior to Visit  Medication Sig Dispense Refill  . amLODipine (NORVASC) 5 MG tablet Take 5 mg by mouth daily.     Marland Kitchen atorvastatin (LIPITOR) 20 MG tablet Take 20 mg by mouth daily at 6 PM.     . Cholecalciferol (VITAMIN D3) 50 MCG (2000 UT) TABS Take 2,000 Units by mouth daily.     . ferrous sulfate  325 (65 FE) MG tablet Take 325 mg by mouth daily with breakfast.     . GNP STOOL SOFTENER/LAXATIVE 8.6-50 MG tablet Take 1 tablet by mouth every other day.    . hydrALAZINE (APRESOLINE) 50 MG tablet Take 50 mg by mouth 3 (three) times daily.     . sodium bicarbonate 650 MG tablet Take 650 mg by mouth 2 (two) times daily.    . traMADol (ULTRAM) 50 MG tablet Take 1 tablet (50 mg total) by mouth every 6 (six) hours as needed for moderate pain. 15 tablet 0  . lidocaine (XYLOCAINE) 2 % solution Patient: Mix 1part 2% viscous lidocaine, 1part H20. Swallow 57mL of diluted mixture, 6min before meals and at bedtime, up to QID (Patient not taking: Reported on 03/31/2020) 150 mL 4   No current facility-administered medications on file prior to visit.    Social History   Socioeconomic History  . Marital status: Married    Spouse name: Not on file  . Number of children: Not on  file  . Years of education: Not on file  . Highest education level: Not on file  Occupational History  . Not on file  Tobacco Use  . Smoking status: Former Smoker    Years: 40.00    Types: Cigarettes  . Smokeless tobacco: Never Used  . Tobacco comment: Quit in 1998  Substance and Sexual Activity  . Alcohol use: Never  . Drug use: Never  . Sexual activity: Not on file  Other Topics Concern  . Not on file  Social History Narrative  . Not on file   Social Determinants of Health   Financial Resource Strain:   . Difficulty of Paying Living Expenses:   Food Insecurity:   . Worried About Charity fundraiser in the Last Year:   . Arboriculturist in the Last Year:   Transportation Needs: No Transportation Needs  . Lack of Transportation (Medical): No  . Lack of Transportation (Non-Medical): No  Physical Activity:   . Days of Exercise per Week:   . Minutes of Exercise per Session:   Stress:   . Feeling of Stress :   Social Connections:   . Frequency of Communication with Friends and Family:   . Frequency of Social Gatherings with Friends and Family:   . Attends Religious Services:   . Active Member of Clubs or Organizations:   . Attends Archivist Meetings:   Marland Kitchen Marital Status:   Intimate Partner Violence: Not At Risk  . Fear of Current or Ex-Partner: No  . Emotionally Abused: No  . Physically Abused: No  . Sexually Abused: No    History reviewed. No pertinent family history.  BP (!) 160/70   Pulse 79   Ht 5\' 11"  (1.803 m)   Wt 178 lb (80.7 kg)   BMI 24.83 kg/m   Body mass index is 24.83 kg/m.     Objective:   Physical Exam Vitals and nursing note reviewed.  Constitutional:      Appearance: He is well-developed.  HENT:     Head: Normocephalic and atraumatic.  Eyes:     Conjunctiva/sclera: Conjunctivae normal.     Pupils: Pupils are equal, round, and reactive to light.  Cardiovascular:     Rate and Rhythm: Normal rate and regular rhythm.    Pulmonary:     Effort: Pulmonary effort is normal.  Abdominal:     Palpations: Abdomen is soft.  Musculoskeletal:  Arms:     Cervical back: Normal range of motion and neck supple.  Skin:    General: Skin is warm and dry.  Neurological:     Mental Status: He is alert and oriented to person, place, and time.     Cranial Nerves: No cranial nerve deficit.     Motor: No abnormal muscle tone.     Coordination: Coordination normal.     Deep Tendon Reflexes: Reflexes are normal and symmetric. Reflexes normal.  Psychiatric:        Behavior: Behavior normal.        Thought Content: Thought content normal.        Judgment: Judgment normal.     I have independently reviewed and interpreted x-rays of this patient done at another site by another physician or qualified health professional.  I have reviewed the ER records as well.      Assessment & Plan:   Encounter Diagnoses  Name Primary?  . Pain of right upper extremity Yes  . Stage 4 chronic kidney disease ([place])   . Anemia in other chronic diseases classified elsewhere    He has full motion today.  He has had slight reaction to the injection he had.  I do not think it was an allergic reaction but just a muscle reaction.  I told him this can happen at times with any injection.  I see no redness, no swelling and certainly no residual.  I will see as needed.  Let the office know where he had the injection done about what happened.  He could have remotely reacted to a preservative in the medicine, I do not know.  Call if any problem.  Precautions discussed.   Electronically Signed Sanjuana Kava, MD 4/15/20219:59 AM

## 2020-08-23 ENCOUNTER — Telehealth: Payer: Self-pay | Admitting: *Deleted

## 2020-08-23 NOTE — Telephone Encounter (Signed)
CALLED PATIENT TO ASK ABOUT COMING FOR FU TOMORROW, PATIENT AGREED TO DO SO

## 2020-08-24 ENCOUNTER — Other Ambulatory Visit: Payer: Self-pay

## 2020-08-24 ENCOUNTER — Ambulatory Visit
Admission: RE | Admit: 2020-08-24 | Discharge: 2020-08-24 | Disposition: A | Discharge status: Home / Self Care | Payer: Medicare Other | Source: Ambulatory Visit | Attending: Radiation Oncology | Admitting: Radiation Oncology

## 2020-08-24 ENCOUNTER — Encounter: Payer: Self-pay | Admitting: Radiation Oncology

## 2020-08-24 VITALS — BP 166/72 | HR 73 | Temp 98.1°F | Resp 20 | Ht 71.0 in | Wt 177.2 lb

## 2020-08-24 DIAGNOSIS — I10 Essential (primary) hypertension: Secondary | ICD-10-CM | POA: Insufficient documentation

## 2020-08-24 DIAGNOSIS — R011 Cardiac murmur, unspecified: Secondary | ICD-10-CM

## 2020-08-24 DIAGNOSIS — R635 Abnormal weight gain: Secondary | ICD-10-CM

## 2020-08-24 DIAGNOSIS — Z1329 Encounter for screening for other suspected endocrine disorder: Secondary | ICD-10-CM

## 2020-08-24 DIAGNOSIS — R739 Hyperglycemia, unspecified: Secondary | ICD-10-CM

## 2020-08-24 DIAGNOSIS — C320 Malignant neoplasm of glottis: Secondary | ICD-10-CM | POA: Diagnosis present

## 2020-08-24 DIAGNOSIS — E785 Hyperlipidemia, unspecified: Secondary | ICD-10-CM | POA: Diagnosis not present

## 2020-08-24 MED ORDER — LARYNGOSCOPY SOLUTION RAD-ONC
15.0000 mL | Freq: Once | TOPICAL | Status: AC
  Administered 2020-08-24: 15 mL via TOPICAL

## 2020-08-24 NOTE — Progress Notes (Signed)
Mr. Current presents today for follow up of radiation to his larynx completed on 12/16/2019  Pain issues, if any: Patient denies Using a feeding tube?: N/A Weight changes, if any:  Wt Readings from Last 3 Encounters:  08/24/20 177 lb 3.2 oz (80.4 kg)  03/31/20 178 lb (80.7 kg)  03/26/20 171 lb (77.6 kg)   Swallowing issues, if any: Patient denies. He states he is able to eat and drink whatever he wants without difficulty. Smoking or chewing tobacco? None Using fluoride trays daily? N/A Last ENT visit was on: 06/14/2020 Saw Dr. Jerrell Belfast: "Impression & Plans:  The patient returns for follow-up appointment and examination after completing treatment for laryngeal carcinoma. 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 Isidore Moos, completing his treatment on 12/16/2019. He presents today without concerns or complaints, tolerating normal oral diet with good stable weight. Respiratory symptoms stable. Flexible laryngoscopy shows normal bilateral vocal cord mobility without ulcer or lesion, no evidence of recurrent cancer. Patient is stable without evidence of further disease. Plan follow-up with Dr. Isidore Moos in 3 months and with me in 6 months for continued interval monitoring."  Other notable issues, if any: Nothing of note. Patient feels he has recovered and doing well since completing radiation treatment.   Vitals:   08/24/20 1547  BP: (!) 166/72  Pulse: 73  Resp: 20  Temp: 98.1 F (36.7 C)  SpO2: 98%

## 2020-08-24 NOTE — Progress Notes (Signed)
Radiation Oncology         (336) [phone] ________________________________  Name: Jason Avila MRN: [id number]  Date: 08/24/2020  DOB: [birth year]  Follow-Up Visit Note  CC: Pomposini, Cherly Anderson, MD  Pomposini, Cherly Anderson, MD  Diagnosis and Prior Radiotherapy:       ICD-10-CM   1. 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 Screening for hypothyroidism  Z13.29 TSH  3. Hyperglycemia  R73.9 TSH  4. Heart murmur  R01.1   5. Weight gain  R63.5 TSH   Cancer Staging Carcinoma of glottis ([place]) Staging form: Larynx - Glottis, AJCC 8th Edition - Clinical stage from 11/09/2019: Stage 0 (cTis, cN0, cM0) - Signed by Eppie Gibson, MD on 11/10/2019   CHIEF COMPLAINT:  Here for follow-up and surveillance of glottic cancer Radiation Treatment Dates: 11/18/2019 through 12/16/2019 Site Technique Total Dose (Gy) Dose per Fx (Gy) Completed Fx Beam Energies  Larynx: HN_larynx 3D 54/54 2.7 20/20 6X   Narrative:  The patient returns today for routine follow-up.  He is doing well. He is eating well without any dysphagia.  He had a negative laryngoscopy in June at otolaryngology. He and his wife received their Covid vaccines in March. He is not smoking              ALLERGIES:  has No Known Allergies.  Meds: Current Outpatient Medications  Medication Sig Dispense Refill  . amLODipine (NORVASC) 5 MG tablet Take 5 mg by mouth daily.     Marland Kitchen atorvastatin (LIPITOR) 20 MG tablet Take 20 mg by mouth daily at 6 PM.     . Cholecalciferol (VITAMIN D3) 50 MCG (2000 UT) TABS Take 2,000 Units by mouth daily.     . ferrous sulfate 325 (65 FE) MG tablet Take 325 mg by mouth daily with breakfast.     . GNP STOOL SOFTENER/LAXATIVE 8.6-50 MG tablet Take 1 tablet by mouth every other day.    . hydrALAZINE (APRESOLINE) 50 MG tablet Take 100 mg by mouth 3 (three) times daily.     . sodium bicarbonate 650 MG tablet Take 650 mg by mouth 2 (two) times daily.      Current Facility-Administered Medications  Medication Dose Route Frequency Provider Last Rate Last Admin  . laryngocopy solution for Rad-Onc  15 mL Topical Once Eppie Gibson, MD        Physical Findings: The patient is in no acute distress. Patient is alert and oriented. Wt Readings from Last 3 Encounters:  08/24/20 177 lb 3.2 oz (80.4 kg)  03/31/20 178 lb (80.7 kg)  03/26/20 171 lb (77.6 kg)    height is 5\' 11"  (1.803 m) and weight is 177 lb 3.2 oz (80.4 kg). His temperature is 98.1 F (36.7 C). His blood pressure is 166/72 (abnormal) and his pulse is 73. His respiration is 20 and oxygen saturation is 98%. .  General: Alert and oriented, in no acute distress HEENT: Head is normocephalic. Oropharynx is notable for no lesions Neck: Neck is notable for no masses Skin: Skin in treatment fields shows satisfactory healing  Heart: Regular in rate and rhythm with a systolic murmur Chest: Clear to auscultation bilaterally, with no rhonchi, wheezes, or rales. Extremities: No cyanosis or edema. Lymphatics: see Neck Exam   PROCEDURE NOTE: After obtaining consent and anesthetizing the nasal cavity with topical lidocaine and phenylephrine, the flexible endoscope was introduced and passed through the nasal cavity.  No lesions  were appreciated in the pharynx or larynx; the true cords were without nodules and were symmetrically mobile.   Lab Findings: Lab Results  Component Value Date   WBC 3.8 (L) 10/23/2019   HGB 10.0 (L) 10/23/2019   HCT 31.5 (L) 10/23/2019   MCV 86.3 10/23/2019   PLT 224 10/23/2019    Lab Results  Component Value Date   TSH 1.791 11/16/2019    Radiographic Findings: No results found.  Impression/Plan:    1) Head and Neck Cancer Status: NED  2) Thyroid function: Lab tests obtained today, results pending Lab Results  Component Value Date   TSH 1.791 11/16/2019   3) We discussed measures to reduce the risk of infection during the COVID-19 pandemic.  He and  his wife have been vaccinated.  4) Follow-up with otolaryngology in December and me in April.  The patient is pleased with this plan and knows to call if he has any issues in the interim   On date of service, in total, I spent 30 minutes on this encounter. Patient was seen in person. _____________________________________   Eppie Gibson, MD

## 2020-08-25 LAB — TSH: TSH: 3.216 u[IU]/mL (ref 0.320–4.118)

## 2020-08-26 ENCOUNTER — Ambulatory Visit: Payer: Medicare Other

## 2020-08-26 ENCOUNTER — Telehealth: Payer: Self-pay

## 2020-08-26 ENCOUNTER — Ambulatory Visit: Payer: Medicare Other | Admitting: Radiation Oncology

## 2020-08-26 NOTE — Telephone Encounter (Signed)
-----   Message from Eppie Gibson, MD sent at 08/26/2020  7:39 AM EDT ----- Please let them know TSH lab is normal. thanks ----- Message ----- From: Interface, Lab In [facility] Sent: 08/25/2020   8:56 AM EDT To: Eppie Gibson, MD

## 2020-08-26 NOTE — Telephone Encounter (Signed)
Called and spoke with patient to let him know that per Dr. Isidore Moos his TSH lab was normal from 2 days ago. Patient verbalized understanding and appreciation of call. Denied any further needs at this time

## 2021-03-21 ENCOUNTER — Telehealth: Payer: Self-pay | Admitting: *Deleted

## 2021-03-21 NOTE — Telephone Encounter (Signed)
Called patient to remind of lab and fu with Dr. Isidore Moos on 03-22-21, spoke with patient's wife- Delores and she is aware of these appts.

## 2021-03-22 ENCOUNTER — Encounter: Payer: Self-pay | Admitting: Radiation Oncology

## 2021-03-22 ENCOUNTER — Ambulatory Visit
Admission: RE | Admit: 2021-03-22 | Discharge: 2021-03-22 | Disposition: A | Discharge status: Home / Self Care | Payer: Medicare Other | Source: Ambulatory Visit | Attending: Radiation Oncology | Admitting: Radiation Oncology

## 2021-03-22 ENCOUNTER — Other Ambulatory Visit: Payer: Self-pay

## 2021-03-22 VITALS — BP 144/67 | HR 70 | Temp 97.8°F | Resp 20 | Ht 71.0 in | Wt 169.0 lb

## 2021-03-22 DIAGNOSIS — C320 Malignant neoplasm of glottis: Secondary | ICD-10-CM

## 2021-03-22 DIAGNOSIS — R635 Abnormal weight gain: Secondary | ICD-10-CM | POA: Insufficient documentation

## 2021-03-22 DIAGNOSIS — Z923 Personal history of irradiation: Secondary | ICD-10-CM | POA: Diagnosis not present

## 2021-03-22 DIAGNOSIS — R634 Abnormal weight loss: Secondary | ICD-10-CM

## 2021-03-22 DIAGNOSIS — Z1329 Encounter for screening for other suspected endocrine disorder: Secondary | ICD-10-CM

## 2021-03-22 DIAGNOSIS — R7989 Other specified abnormal findings of blood chemistry: Secondary | ICD-10-CM | POA: Diagnosis not present

## 2021-03-22 DIAGNOSIS — R739 Hyperglycemia, unspecified: Secondary | ICD-10-CM

## 2021-03-22 DIAGNOSIS — Z8521 Personal history of malignant neoplasm of larynx: Secondary | ICD-10-CM | POA: Diagnosis not present

## 2021-03-22 LAB — T4, FREE: Free T4: 0.86 ng/dL (ref 0.61–1.12)

## 2021-03-22 LAB — TSH: TSH: 8.484 u[IU]/mL — ABNORMAL HIGH (ref 0.320–4.118)

## 2021-03-22 MED ORDER — OXYMETAZOLINE HCL 0.05 % NA SOLN
1.0000 | Freq: Once | NASAL | Status: AC
  Administered 2021-03-22: 1 via NASAL
  Filled 2021-03-22: qty 30

## 2021-03-22 NOTE — Progress Notes (Signed)
Mr. Dunstan presents today for follow up of radiation to his larynx completed on 12/16/2019  Pain issues, if any: no Using a feeding tube?: N/A Weight changes, if any: Patient states that he has lost 4-5 pounds since he last saw Dr. Isidore Moos. Patient states that he does not have much of an appetite.  Swallowing issues, if any: no  Smoking or chewing tobacco? no Using fluoride trays daily? no Last ENT visit was on: 11/22/2020 Saw Dr. Jerrell Belfast: "Impression & Plans:  The patient presents today for scheduled follow-up appointment for surveillance of laryngeal carcinoma. Patient completed radiation therapy on 12/16/2019 by Dr. 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 Flexible laryngoscopy shows normal bilateral vocal cord mobility, no evidence of scar tissue, ulcer or lesion. Patient is wife are reassured regarding his findings and he appears to be free of any residual disease or recurrent problem at 1 year post radiation therapy. Continue to monitor for additional symptoms, plan follow-up in 6 months for recheck and repeat laryngoscopy."  Other notable issues, if any: Patient denies having fatigue. Patient states that he is walking 6 days a week.   BP (!) 144/67 (BP Location: Right Arm, Patient Position: Sitting, Cuff Size: Normal)   Pulse 70   Temp 97.8 F (36.6 C)   Resp 20   Ht 5\' 11"  (1.803 m)   Wt 169 lb (76.7 kg)   SpO2 98%   BMI 23.57 kg/m

## 2021-03-23 ENCOUNTER — Encounter: Payer: Self-pay | Admitting: Radiation Oncology

## 2021-03-23 NOTE — Progress Notes (Signed)
Radiation Oncology         (336) [phone] ________________________________  Name: Jason Avila MRN: [id number]  Date: 03/22/2021  DOB: 11-Apr-1933  Follow-Up Visit Note  CC: Pomposini, Cherly Anderson, MD  Pomposini, Cherly Anderson, MD  Diagnosis and Prior Radiotherapy:       ICD-10-CM   1. Carcinoma of glottis ([facility])  C32.0 oxymetazoline (AFRIN) 0.05 % nasal spray 1 spray    Fiberoptic laryngoscopy    T4, free  2. Elevated TSH  R79.89 T4, free  3. Loss of weight  R63.4 T4, free   Cancer Staging Carcinoma of glottis ([facility]) Staging form: Larynx - Glottis, AJCC 8th Edition - Clinical stage from 11/09/2019: Stage 0 (cTis, cN0, cM0) - Signed by Eppie Gibson, MD on 11/10/2019 Stage prefix: Initial diagnosis   CHIEF COMPLAINT:  Here for follow-up and surveillance of glottic cancer Radiation Treatment Dates: 11/18/2019 through 12/16/2019 Site Technique Total Dose (Gy) Dose per Fx (Gy) Completed Fx Beam Energies  Larynx: HN_larynx 3D 54/54 2.7 20/20 6X   Narrative:   [name] presents today for follow up of radiation to his larynx completed on 12/16/2019  Pain issues, if any: no Using a feeding tube?: N/A Weight changes, if any: Patient states that he has lost 4-5 pounds since he last saw Dr. Isidore Moos. Patient states that he does not have much of an appetite.  Swallowing issues, if any: no  Smoking or chewing tobacco? no Using fluoride trays daily? no Last ENT visit was on: 11/22/2020 Saw Dr. Jerrell Belfast: "Impression & Plans:  The patient presents today for scheduled follow-up appointment for surveillance of laryngeal carcinoma. Patient completed radiation therapy on 12/16/2019 by Dr. 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 Flexible laryngoscopy shows normal bilateral vocal cord mobility, no evidence of scar tissue, ulcer or lesion. Patient is wife are reassured regarding his findings and he appears to be free of any residual disease or recurrent problem at 1 year post radiation  therapy. Continue to monitor for additional symptoms, plan follow-up in 6 months for recheck and repeat laryngoscopy."  Other notable issues, if any: Patient denies having fatigue. Patient states that he is walking 6 days a week.   BP (!) 144/67 (BP Location: Right Arm, Patient Position: Sitting, Cuff Size: Normal)   Pulse 70   Temp 97.8 F (36.6 C)   Resp 20   Ht 5\' 11"  (1.803 m)   Wt 169 lb (76.7 kg)   SpO2 98%   BMI 23.57 kg/m         ALLERGIES:  has No Known Allergies.  Meds: Current Outpatient Medications  Medication Sig Dispense Refill  . amLODipine (NORVASC) 5 MG tablet Take 5 mg by mouth daily.     Marland Kitchen atorvastatin (LIPITOR) 20 MG tablet Take 20 mg by mouth daily at 6 PM.     . Cholecalciferol (VITAMIN D3) 50 MCG (2000 UT) TABS Take 2,000 Units by mouth daily.     . ferrous sulfate 325 (65 FE) MG tablet Take 325 mg by mouth daily with breakfast.     . GNP STOOL SOFTENER/LAXATIVE 8.6-50 MG tablet Take 1 tablet by mouth every other day.    . hydrALAZINE (APRESOLINE) 50 MG tablet Take 100 mg by mouth 3 (three) times daily.     . sodium bicarbonate 650 MG tablet Take 650 mg by mouth 2 (two) times daily.     No current facility-administered medications for this encounter.    Physical Findings: The patient is  in no acute distress. Patient is alert and oriented. Wt Readings from Last 3 Encounters:  03/22/21 169 lb (76.7 kg)  08/24/20 177 lb 3.2 oz (80.4 kg)  03/31/20 178 lb (80.7 kg)    height is 5\' 11"  (1.803 m) and weight is 169 lb (76.7 kg). His temperature is 97.8 F (36.6 C). His blood pressure is 144/67 (abnormal) and his pulse is 70. His respiration is 20 and oxygen saturation is 98%. .  General: Alert and oriented, in no acute distress HEENT: Head is normocephalic. Oropharynx is notable for no lesions Neck: Neck is notable for no masses Skin: warm, intact over neck. Extremities: No cyanosis or edema. Lymphatics: see Neck Exam  PROCEDURE NOTE: After obtaining  consent and spraying the nasal cavity with topical oxymetolazine, the flexible endoscope was introduced and passed through the nasal cavity.  No lesions were appreciated in the pharynx or larynx; the true cords were without nodules and were symmetrically mobile.   Difficult to fully see the right vocal cord due to position of epiglottis.  Lab Findings: Lab Results  Component Value Date   WBC 3.8 (L) 10/23/2019   HGB 10.0 (L) 10/23/2019   HCT 31.5 (L) 10/23/2019   MCV 86.3 10/23/2019   PLT 224 10/23/2019    Lab Results  Component Value Date   TSH 8.484 (H) 03/22/2021   Free T4 is 0.86 today (normal)  Radiographic Findings: No results found.  Impression/Plan:    1) Head and Neck Cancer Status: NED  2) Thyroid function: Free T4 normal though TSH elevated. Will follow and consider supplementation in future based on Free T4  3) We discussed measures to reduce the risk of infection during the COVID-19 pandemic.  He and his wife have been vaccinated.  4) Follow-up with otolaryngology as scheduled and me in 7mo.  The patient is pleased with this plan and knows to call if he has any issues in the interim   On date of service, in total, I spent 30 minutes on this encounter. Patient was seen in person. _____________________________________   Eppie Gibson, MD

## 2021-03-24 ENCOUNTER — Other Ambulatory Visit: Payer: Self-pay

## 2021-03-24 DIAGNOSIS — Z1329 Encounter for screening for other suspected endocrine disorder: Secondary | ICD-10-CM

## 2021-09-15 ENCOUNTER — Encounter: Payer: Self-pay | Admitting: Internal Medicine

## 2021-10-12 ENCOUNTER — Telehealth: Payer: Self-pay | Admitting: *Deleted

## 2021-10-12 NOTE — Telephone Encounter (Signed)
CALLED PATIENT TO REMIND OF LAB PRIOR TO FU TOMORROW, LVM FOR A RETURN CALL

## 2021-10-13 ENCOUNTER — Telehealth: Payer: Self-pay | Admitting: *Deleted

## 2021-10-13 ENCOUNTER — Ambulatory Visit
Admission: RE | Admit: 2021-10-13 | Discharge: 2021-10-13 | Disposition: A | Discharge status: Home / Self Care | Payer: Medicare Other | Source: Ambulatory Visit | Attending: Radiation Oncology | Admitting: Radiation Oncology

## 2021-10-13 ENCOUNTER — Ambulatory Visit: Payer: Medicare Other

## 2021-10-13 ENCOUNTER — Other Ambulatory Visit: Payer: Self-pay

## 2021-10-13 VITALS — BP 160/75 | HR 72 | Temp 97.9°F | Resp 20 | Ht 71.0 in | Wt 165.2 lb

## 2021-10-13 DIAGNOSIS — C320 Malignant neoplasm of glottis: Secondary | ICD-10-CM

## 2021-10-13 DIAGNOSIS — H6123 Impacted cerumen, bilateral: Secondary | ICD-10-CM | POA: Insufficient documentation

## 2021-10-13 DIAGNOSIS — J383 Other diseases of vocal cords: Secondary | ICD-10-CM

## 2021-10-13 DIAGNOSIS — Z1329 Encounter for screening for other suspected endocrine disorder: Secondary | ICD-10-CM

## 2021-10-13 DIAGNOSIS — Z8521 Personal history of malignant neoplasm of larynx: Secondary | ICD-10-CM | POA: Insufficient documentation

## 2021-10-13 DIAGNOSIS — Z992 Dependence on renal dialysis: Secondary | ICD-10-CM | POA: Insufficient documentation

## 2021-10-13 DIAGNOSIS — Z923 Personal history of irradiation: Secondary | ICD-10-CM | POA: Insufficient documentation

## 2021-10-13 LAB — T4, FREE: Free T4: 0.74 ng/dL (ref 0.61–1.12)

## 2021-10-13 MED ORDER — OXYMETAZOLINE HCL 0.05 % NA SOLN
1.0000 | Freq: Once | NASAL | Status: DC
Start: 2021-10-13 — End: 2021-10-14
  Filled 2021-10-13: qty 30

## 2021-10-13 MED ORDER — OXYMETAZOLINE HCL 0.05 % NA SOLN
1.0000 | Freq: Two times a day (BID) | NASAL | Status: DC
  Administered 2021-10-13: 1 via NASAL

## 2021-10-13 NOTE — Telephone Encounter (Signed)
CALLED PATIENT TO ASK ABOUT COMING FOR LAB @ 3:30 PM TODAY AND HIS FU APPT. TODAY @ 4 PM, LVM FOR A  RETURN CALL

## 2021-10-13 NOTE — Progress Notes (Signed)
Jason Avila presents today for follow up of radiation to his larynx completed on 12/16/2019  Pain issues, if any: Patient denies Using a feeding tube?: N/A Weight changes, if any:  Wt Readings from Last 3 Encounters:  10/13/21 165 lb 3.2 oz (74.9 kg)  03/22/21 169 lb (76.7 kg)  08/24/20 177 lb 3.2 oz (80.4 kg)   Swallowing issues, if any: Denies any issues, but reports a depressed appetite (mainly as a result of altered sense of taste) Smoking or chewing tobacco? None Using fluoride trays daily? N/A--sees his community dentist every 6 months Last ENT visit was on: 08/01/2021 Saw Dr. 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 Treated with primary radiation therapy completed in December 2020 with Dr. Isidore Moos at the [place] [facility]. Patient stable and doing well, no changes in voice or swallowing. He recently underwent shunt placement for peritoneal dialysis, patient stable after his anesthetic. No evidence of recurrent disease at this point, patient is stable. Plan follow-up with me in 6 months. After his 2-year anniversary from completing radiation therapy we will extend the follow-up intervals. Follow-up as scheduled with Dr. Isidore Moos in the fall. Patient has bilateral medial cerumen impactions which are cleared under otomicroscopy without difficulty, normal external auditory canals and tympanic membranes. Patient will continue with his prescribed hearing amplification."  Other notable issues, if any: Started peritoneal dialysis last month. Denies any ear or jaw pain, or difficulty opening his mouth. Reports resolution of dry mouth and thick saliva.

## 2021-10-13 NOTE — Telephone Encounter (Signed)
XXXX 

## 2021-10-16 LAB — TSH: TSH: 3.255 u[IU]/mL (ref 0.320–4.118)

## 2021-10-16 NOTE — Progress Notes (Signed)
Radiation Oncology         [phone] ________________________________  Name: Jason Avila MRN: [id number]  Date: 10/13/2021  DOB: 04/20/1933  Follow-Up Visit Note  CC: Pomposini, Cherly Anderson, MD  Pomposini, Cherly Anderson, MD  Diagnosis and Prior Radiotherapy:       ICD-10-CM   1. 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 Vocal cord mass  J38.3 DISCONTINUED: oxymetazoline (AFRIN) 0.05 % nasal spray 1 spray     Cancer Staging Carcinoma of glottis ([facility]) Staging form: Larynx - Glottis, AJCC 8th Edition - Clinical stage from 11/09/2019: Stage 0 (cTis, cN0, cM0) - Signed by Eppie Gibson, MD on 11/10/2019 Stage prefix: Initial diagnosis   CHIEF COMPLAINT:  Here for follow-up and surveillance of glottic cancer Radiation Treatment Dates: 11/18/2019 through 12/16/2019 Site Technique Total Dose (Gy) Dose per Fx (Gy) Completed Fx Beam Energies  Larynx: HN_larynx 3D 54/54 2.7 20/20 6X   Narrative:    Mr. Durrell presents today for follow up of radiation to his larynx completed on 12/16/2019  Pain issues, if any: Patient denies Using a feeding tube?: N/A Weight changes, if any:  Wt Readings from Last 3 Encounters:  10/13/21 165 lb 3.2 oz (74.9 kg)  03/22/21 169 lb (76.7 kg)  08/24/20 177 lb 3.2 oz (80.4 kg)   Swallowing issues, if any: Denies any issues, but reports a depressed appetite (mainly as a result of altered sense of taste) Smoking or chewing tobacco? None Using fluoride trays daily? N/A--sees his community dentist every 6 months Last ENT visit was on: 08/01/2021 Saw Dr. 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 Treated with primary radiation therapy completed in December 2020 with Dr. Isidore Moos at the [place] [facility]. Patient stable and doing well, no changes in  voice or swallowing. He recently underwent shunt placement for peritoneal dialysis, patient stable after his anesthetic. No evidence of recurrent disease at this point, patient is stable. Plan follow-up with me in 6 months. After his 2-year anniversary from completing radiation therapy we will extend the follow-up intervals. Follow-up as scheduled with Dr. Isidore Moos in the fall. Patient has bilateral medial cerumen impactions which are cleared under otomicroscopy without difficulty, normal external auditory canals and tympanic membranes. Patient will continue with his prescribed hearing amplification."  Other notable issues, if any: Started peritoneal dialysis last month. Denies any ear or jaw pain, or difficulty opening his mouth. Reports resolution of dry mouth and thick saliva.     ALLERGIES:  has No Known Allergies.  Meds: Current Outpatient Medications  Medication Sig Dispense Refill   Cholecalciferol 25 MCG (1000 UT) capsule Take 1 capsule by mouth daily.     hydrALAZINE (APRESOLINE) 100 MG tablet Take 1 tablet by mouth 3 (three) times daily.     sucroferric oxyhydroxide (VELPHORO) 500 MG chewable tablet Chew 500 tablets by mouth daily.     amLODipine (NORVASC) 5 MG tablet Take 5 mg by mouth daily.      atorvastatin (LIPITOR) 20 MG tablet Take 20 mg by mouth daily at 6 PM.      epoetin alfa (EPOGEN) 10000 UNIT/ML injection Inject 1 Units into the skin every 14 (fourteen) days.     GNP STOOL SOFTENER/LAXATIVE 8.6-50 MG tablet Take 1 tablet by mouth every other day.  levothyroxine (SYNTHROID) 25 MCG tablet Take 1 tablet by mouth every morning.     No current facility-administered medications for this encounter.    Physical Findings: The patient is in no acute distress. Patient is alert and oriented. Wt Readings from Last 3 Encounters:  10/13/21 165 lb 3.2 oz (74.9 kg)  03/22/21 169 lb (76.7 kg)  08/24/20 177 lb 3.2 oz (80.4 kg)    height is 5\' 11"  (1.803 m) and weight is 165 lb 3.2  oz (74.9 kg). His temperature is 97.9 F (36.6 C). His blood pressure is 160/75 (abnormal) and his pulse is 72. His respiration is 20 and oxygen saturation is 98%. .  General: Alert and oriented, in no acute distress HEENT: Head is normocephalic. Oropharynx is notable for no lesions Neck: Neck is notable for no masses Skin: warm, intact over neck. Extremities: No cyanosis or edema. Lymphatics: see Neck Exam Heart RRR Chest clear to auscultation bilaterally  PROCEDURE NOTE: After obtaining consent and spraying the nasal cavity with topical oxymetolazine, the flexible endoscope was introduced and passed through the nasal cavity.  No lesions were appreciated in the nasopharynx, oropharynx, hypopharynx or larynx; the true cords were without nodules and were symmetrically mobile.      Lab Findings: Lab Results  Component Value Date   WBC 3.8 (L) 10/23/2019   HGB 10.0 (L) 10/23/2019   HCT 31.5 (L) 10/23/2019   MCV 86.3 10/23/2019   PLT 224 10/23/2019    Lab Results  Component Value Date   TSH 3.255 10/13/2021      Radiographic Findings: No results found.  Impression/Plan:    1) Head and Neck Cancer Status: NED  2) Thyroid function: WNL today Lab Results  Component Value Date   TSH 3.255 10/13/2021   TSH 8.484 (H) 03/22/2021   TSH 3.216 08/24/2020   TSH 1.791 11/16/2019    3) Follow-up with otolaryngology as scheduled and me in 39mo.  The patient is pleased with this plan and knows to call if he has any issues in the interim  On date of service, in total, I spent 30 minutes on this encounter. Patient was seen in person. _____________________________________   Eppie Gibson, MD

## 2021-12-19 DIAGNOSIS — C210 Malignant neoplasm of anus, unspecified: Secondary | ICD-10-CM | POA: Insufficient documentation

## 2021-12-19 NOTE — Progress Notes (Signed)
[facility] [address] [place] [address]   CLINIC:  Medical Oncology/Hematology  CONSULT NOTE  Patient Care Team: Pomposini, Cherly Anderson, MD as PCP - General (Internal Medicine) Eppie Gibson, MD as Attending Physician (Radiation Oncology) Derek Jack, MD as Medical Oncologist (Medical Oncology) Brien Mates, RN as Oncology Nurse Navigator (Oncology)  CHIEF COMPLAINTS/PURPOSE OF CONSULTATION:  Evaluation of rectal cancer  HISTORY OF PRESENTING ILLNESS:  [name] 88 y.o. male is here because of evaluation of rectal cancer, at the request of Dr. Jerline Pain.  Today he reports feeling well. He reports rectal bleeding starting 12/06/21 at which time he was hospitalized. He has lost 30 lbs in the past 6-8 months. He has been on dialysis at home since 08/23/21 due to CKD and hypertension. He reports he previously has cancer in his vocal cords. He denies any new pains, and n/v/d/c. He has a peritoneal catheter placed. He has received IV Iron. He denies history of CVA and MI.   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 He quit smoking 24 years ago. He denies any unusual or excessive chemical exposure. He denies family history of cancer.   MEDICAL HISTORY:  Past Medical History:  Diagnosis Date   Anemia    Aortic valve stenosis    mild AS 04/2017 (followed by Dr. 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   Chronic kidney disease    ckd stage 4, bilateral cysts on kidneys [facility])   Dysrhythmia    RBBB   GERD (gastroesophageal reflux disease)    Gout    left foot   Heart murmur    no problems   History of blood transfusion 2018   History of radiation therapy 11/18/19- 12/16/19   Larynx 54 Gy in 20 fx   Hyperlipidemia    Hypertension    Sleep apnea    Subdural hematoma 11/2018   Wears glasses     SURGICAL HISTORY: Past Surgical  History:  Procedure Laterality Date   BURR HOLE FOR SUBDURAL HEMATOMA  12/15/2018   brain   COLONOSCOPY     EYE SURGERY Bilateral    cataracts removed   MICROLARYNGOSCOPY N/A 10/23/2019   Procedure: Microlaryngoscopy with vocal cord lesion biopsy;  Surgeon: Jerrell Belfast, MD;  Location: [facility];  Service: ENT;  Laterality: N/A;   UPPER GI ENDOSCOPY      SOCIAL HISTORY: Social History   Socioeconomic History   Marital status: Married    Spouse name: Not on file   Number of children: Not on file   Years of education: Not on file   Highest education level: Not on file  Occupational History   Not on file  Tobacco Use   Smoking status: Former    Years: 40.00    Types: Cigarettes   Smokeless tobacco: Never   Tobacco comments:    Quit in 1998  Vaping Use   Vaping Use: Never used  Substance and Sexual Activity   Alcohol use: Never   Drug use: Never   Sexual activity: Not on file  Other Topics Concern   Not on file  Social History Narrative   Not on file   Social Determinants of Health   Financial Resource Strain: Not on file  Food Insecurity: Not on file  Transportation Needs: Not on file  Physical Activity: Not on file  Stress: Not on file  Social Connections: Not on file  Intimate Partner Violence: Not on file    FAMILY HISTORY: History reviewed. No pertinent family history.  ALLERGIES:  has No Known Allergies.  MEDICATIONS:  Current Outpatient Medications  Medication Sig Dispense Refill   Amino Acids (LIQUACEL) LIQD Take by mouth.     amLODipine (NORVASC) 5 MG tablet Take 5 mg by mouth daily.      atorvastatin (LIPITOR) 20 MG tablet Take 20 mg by mouth daily at 6 PM.      Cholecalciferol 25 MCG (1000 UT) capsule Take 1 capsule by mouth daily.     Docusate Sodium (DSS) 100 MG CAPS Take by mouth.     epoetin alfa (EPOGEN) 10000 UNIT/ML injection Inject 1 Units into the skin every 14 (fourteen) days.     GNP STOOL SOFTENER/LAXATIVE 8.6-50 MG tablet Take 1  tablet by mouth every other day.     hydrALAZINE (APRESOLINE) 100 MG tablet Take 1 tablet by mouth 3 (three) times daily.     levothyroxine (SYNTHROID) 25 MCG tablet Take 1 tablet by mouth every morning.     mirtazapine (REMERON) 15 MG tablet Take 15 mg by mouth daily.     pantoprazole (PROTONIX) 40 MG tablet Take 40 mg by mouth every morning.     sucroferric oxyhydroxide (VELPHORO) 500 MG chewable tablet Chew 500 tablets by mouth daily.     No current facility-administered medications for this visit.    REVIEW OF SYSTEMS:   Review of Systems  Constitutional:  Positive for appetite change. Negative for fatigue.  Gastrointestinal:  Negative for constipation, diarrhea, nausea and vomiting.  Musculoskeletal:  Negative for arthralgias and myalgias.  All other systems reviewed and are negative.   PHYSICAL EXAMINATION: ECOG PERFORMANCE STATUS: 1 - Symptomatic but completely ambulatory  Vitals:   12/20/21 0921  BP: (!) 150/64  Pulse: 76  Resp: 17  Temp: (!) 96.7 F (35.9 C)  SpO2: 96%   Filed Weights   12/20/21 0921  Weight: 158 lb 12.8 oz (72 kg)   Physical Exam Vitals reviewed.  Constitutional:      Appearance: Normal appearance.  Cardiovascular:     Rate and Rhythm: Normal rate and regular rhythm.     Pulses: Normal pulses.     Heart sounds: Normal heart sounds.  Pulmonary:     Effort: Pulmonary effort is normal.     Breath sounds: Normal breath sounds.  Abdominal:     Palpations: Abdomen is soft. There is no hepatomegaly, splenomegaly or mass.     Tenderness: There is no abdominal tenderness.  Genitourinary:    Rectum: Mass (within 2 cm towards R) present.  Musculoskeletal:     Right lower leg: No edema.     Left lower leg: No edema.  Lymphadenopathy:     Cervical: No cervical adenopathy.     Right cervical: No superficial cervical adenopathy.    Left cervical: No superficial cervical adenopathy.  Neurological:     General: No focal deficit present.     Mental  Status: He is alert and oriented to person, place, and time.  Psychiatric:        Mood and Affect: Mood normal.        Behavior: Behavior normal.     LABORATORY DATA:  I have reviewed the data as listed CBC Latest Ref Rng & Units 10/23/2019  WBC 4.0 - 10.5 K/uL 3.8(L)  Hemoglobin 13.0 - 17.0 g/dL 10.0(L)  Hematocrit 39.0 - 52.0 %  31.5(L)  Platelets 150 - 400 K/uL 224   CMP Latest Ref Rng & Units 10/23/2019  Glucose 70 - 99 mg/dL 102(H)  BUN 8 - 23 mg/dL 42(H)  Creatinine 0.61 - 1.24 mg/dL 3.86(H)  Sodium 135 - 145 mmol/L 140  Potassium 3.5 - 5.1 mmol/L 3.3(L)  Chloride 98 - 111 mmol/L 107  CO2 22 - 32 mmol/L 24  Calcium 8.9 - 10.3 mg/dL 8.7(L)    RADIOGRAPHIC STUDIES: I have personally reviewed the radiological images as listed and agreed with the findings in the report. No results found.  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 - EGD and colonoscopy on 12/08/2021 with grossly normal EGD.  Colonoscopy showed 2 cm mass with cratered ulcer with distal margin abutting the anal verge. 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 - Reports 30 pound weight loss unintentional in the last 6 months.  He was also started on hemodialysis in September 2022. - MRI rectal cancer protocol (at [facility] clinic) shows T1/T2.  No lymph nodes were seen.  Craniocaudal length of the tumor is 3.3 cm.   Social/family history: - Lives at home with his wife.  He is a retired Glass blower/designer at [employer].  Quit smoking 24 years ago.  No chemical exposure.  No family history of malignancies.  3.  ESRD: - He is on peritoneal dialysis at home, started on 08/23/2021. - He is followed by Dr. Elita Quick in [place].  4.  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 - This is a squamous cell carcinoma arising in the anal canal, not low rectal cancer. - Recommend PET CT scan for metastatic work-up. - RTC after PET scan to discuss results. - If there is no metastatic disease, primary treatment today is with mitomycin/5-FU or capecitabine with radiation therapy. - Him being on dialysis can complicate things with chemotherapy.  Radical surgery options include APR.  2.  Normocytic anemia: - Recent labs at [facility] show that he has normocytic anemia. - We will check ferritin, iron panel, U23, folic acid, MMA, copper, SPEP.   All questions were answered. The patient knows to call the clinic with any problems, questions or concerns.   Derek Jack, MD, 12/20/21 10:05 AM  [place] [phone]   I, Thana Ates, am acting as a scribe for Dr. Derek Jack.  I, Derek Jack MD, have reviewed the above documentation for accuracy and completeness, and I agree with the above.

## 2021-12-20 ENCOUNTER — Encounter (HOSPITAL_COMMUNITY): Payer: Self-pay | Admitting: Hematology

## 2021-12-20 ENCOUNTER — Other Ambulatory Visit: Payer: Self-pay

## 2021-12-20 ENCOUNTER — Inpatient Hospital Stay (HOSPITAL_COMMUNITY): Payer: Medicare Other

## 2021-12-20 ENCOUNTER — Inpatient Hospital Stay (HOSPITAL_COMMUNITY): Payer: Medicare Other | Attending: Hematology | Admitting: Hematology

## 2021-12-20 DIAGNOSIS — N186 End stage renal disease: Secondary | ICD-10-CM | POA: Diagnosis not present

## 2021-12-20 DIAGNOSIS — D649 Anemia, unspecified: Secondary | ICD-10-CM | POA: Insufficient documentation

## 2021-12-20 DIAGNOSIS — Z992 Dependence on renal dialysis: Secondary | ICD-10-CM | POA: Diagnosis not present

## 2021-12-20 DIAGNOSIS — Z87891 Personal history of nicotine dependence: Secondary | ICD-10-CM | POA: Diagnosis not present

## 2021-12-20 DIAGNOSIS — R634 Abnormal weight loss: Secondary | ICD-10-CM | POA: Insufficient documentation

## 2021-12-20 DIAGNOSIS — Z8521 Personal history of malignant neoplasm of larynx: Secondary | ICD-10-CM | POA: Diagnosis not present

## 2021-12-20 DIAGNOSIS — I120 Hypertensive chronic kidney disease with stage 5 chronic kidney disease or end stage renal disease: Secondary | ICD-10-CM | POA: Insufficient documentation

## 2021-12-20 DIAGNOSIS — K5900 Constipation, unspecified: Secondary | ICD-10-CM | POA: Diagnosis not present

## 2021-12-20 DIAGNOSIS — C210 Malignant neoplasm of anus, unspecified: Secondary | ICD-10-CM | POA: Insufficient documentation

## 2021-12-20 DIAGNOSIS — D518 Other vitamin B12 deficiency anemias: Secondary | ICD-10-CM

## 2021-12-20 LAB — CBC WITH DIFFERENTIAL/PLATELET
Abs Immature Granulocytes: 0.01 10*3/uL (ref 0.00–0.07)
Basophils Absolute: 0 10*3/uL (ref 0.0–0.1)
Basophils Relative: 1 %
Eosinophils Absolute: 0.1 10*3/uL (ref 0.0–0.5)
Eosinophils Relative: 3 %
HCT: 35.7 % — ABNORMAL LOW (ref 39.0–52.0)
Hemoglobin: 11.3 g/dL — ABNORMAL LOW (ref 13.0–17.0)
Immature Granulocytes: 0 %
Lymphocytes Relative: 30 %
Lymphs Abs: 0.8 10*3/uL (ref 0.7–4.0)
MCH: 28.9 pg (ref 26.0–34.0)
MCHC: 31.7 g/dL (ref 30.0–36.0)
MCV: 91.3 fL (ref 80.0–100.0)
Monocytes Absolute: 0.2 10*3/uL (ref 0.1–1.0)
Monocytes Relative: 8 %
Neutro Abs: 1.5 10*3/uL — ABNORMAL LOW (ref 1.7–7.7)
Neutrophils Relative %: 58 %
Platelets: 190 10*3/uL (ref 150–400)
RBC: 3.91 MIL/uL — ABNORMAL LOW (ref 4.22–5.81)
RDW: 13.2 % (ref 11.5–15.5)
WBC: 2.6 10*3/uL — ABNORMAL LOW (ref 4.0–10.5)
nRBC: 0 % (ref 0.0–0.2)

## 2021-12-20 LAB — IRON AND TIBC
Iron: 96 ug/dL (ref 45–182)
Saturation Ratios: 46 % — ABNORMAL HIGH (ref 17.9–39.5)
TIBC: 209 ug/dL — ABNORMAL LOW (ref 250–450)
UIBC: 113 ug/dL

## 2021-12-20 LAB — FOLATE: Folate: 10.9 ng/mL (ref >5.9)

## 2021-12-20 LAB — FERRITIN: Ferritin: 330 ng/mL (ref 24–336)

## 2021-12-20 LAB — VITAMIN B12: Vitamin B-12: 598 pg/mL (ref 180–914)

## 2021-12-20 NOTE — Patient Instructions (Addendum)
[place] at [facility] Discharge Instructions  You were seen and examined today by Dr. Delton Coombes. Dr. Delton Coombes is a medical oncologist, meaning that he specializes in the management of cancer diagnoses. Dr. Delton Coombes discussed your past medical history, family history of cancers, and the events that led to you being here today.  You have been diagnosed with Squamous Cell Cancer arising from the anus most likely. This was found on your colonoscopy recently. Dr. Delton Coombes has recommended a PET scan. A PET scan is a specialized CT scan that illuminates where there is cancer present in the body. The PET scan will accurately identify the stage of the cancer.  Dr. Delton Coombes also recommends additional lab work today due to your low hemoglobin.  Follow-up as scheduled after the PET scan.   Thank you for choosing [place] at [facility] to provide your oncology and hematology care.  To afford each patient quality time with our provider, please arrive at least 15 minutes before your scheduled appointment time.   If you have a lab appointment with the [facility] please come in thru the Main Entrance and check in at the main information desk.  You need to re-schedule your appointment should you arrive 10 or more minutes late.  We strive to give you quality time with our providers, and arriving late affects you and other patients whose appointments are after yours.  Also, if you no show three or more times for appointments you may be dismissed from the clinic at the providers discretion.     Again, thank you for choosing [facility].  Our hope is that these requests will decrease the amount of time that you wait before being seen by our physicians.       _____________________________________________________________  Should you have questions after your visit to [facility], please contact our office at [phone] and  follow the prompts.  Our office hours are 8:00 a.m. and 4:30 p.m. Monday - Friday.  Please note that voicemails left after 4:00 p.m. may not be returned until the following business day.  We are closed weekends and major holidays.  You do have access to a nurse 24-7, just call the main number to the clinic [phone] and do not press any options, hold on the line and a nurse will answer the phone.    For prescription refill requests, have your pharmacy contact our office and allow 72 hours.    Due to Covid, you will need to wear a mask upon entering the hospital. If you do not have a mask, a mask will be given to you at the Main Entrance upon arrival. For doctor visits, patients may have 1 support person age 68 or older with them. For treatment visits, patients can not have anyone with them due to social distancing guidelines and our immunocompromised population.

## 2021-12-21 ENCOUNTER — Encounter (HOSPITAL_COMMUNITY)
Admission: RE | Admit: 2021-12-21 | Discharge: 2021-12-21 | Disposition: A | Discharge status: Home / Self Care | Payer: Medicare Other | Source: Ambulatory Visit | Attending: Hematology | Admitting: Hematology

## 2021-12-21 DIAGNOSIS — C210 Malignant neoplasm of anus, unspecified: Secondary | ICD-10-CM | POA: Diagnosis present

## 2021-12-21 DIAGNOSIS — K668 Other specified disorders of peritoneum: Secondary | ICD-10-CM | POA: Insufficient documentation

## 2021-12-21 DIAGNOSIS — N2889 Other specified disorders of kidney and ureter: Secondary | ICD-10-CM | POA: Diagnosis not present

## 2021-12-21 DIAGNOSIS — I700 Atherosclerosis of aorta: Secondary | ICD-10-CM | POA: Insufficient documentation

## 2021-12-21 LAB — PROTEIN ELECTROPHORESIS, SERUM
A/G Ratio: 1.2 (ref 0.7–1.7)
Albumin ELP: 3 g/dL (ref 2.9–4.4)
Alpha-1-Globulin: 0.2 g/dL (ref 0.0–0.4)
Alpha-2-Globulin: 0.7 g/dL (ref 0.4–1.0)
Beta Globulin: 0.8 g/dL (ref 0.7–1.3)
Gamma Globulin: 0.8 g/dL (ref 0.4–1.8)
Globulin, Total: 2.5 g/dL (ref 2.2–3.9)
Total Protein ELP: 5.5 g/dL — ABNORMAL LOW (ref 6.0–8.5)

## 2021-12-21 LAB — CEA: CEA: 2.4 ng/mL (ref 0.0–4.7)

## 2021-12-21 IMAGING — PT NM PET TUM IMG INITIAL (PI) SKULL BASE T - THIGH
1 series · 1 of 1 positions shown · non-contrast
Comparison: None.

CLINICAL DATA: Initial treatment strategy for anal cancer.

EXAM:
NUCLEAR MEDICINE PET SKULL BASE TO THIGH
TECHNIQUE: 8.59 mCi F-18 FDG was injected intravenously. Full-ring PET imaging
was performed from the skull base to thigh after the radiotracer. CT
data was obtained and used for attenuation correction and anatomic
localization.
Fasting blood glucose: 106 mg/dl

[Series 1045: results mm oncology reading · 1.0mm · 0.35mm/px · 1 of 1 slices shown]
[im 1/1]
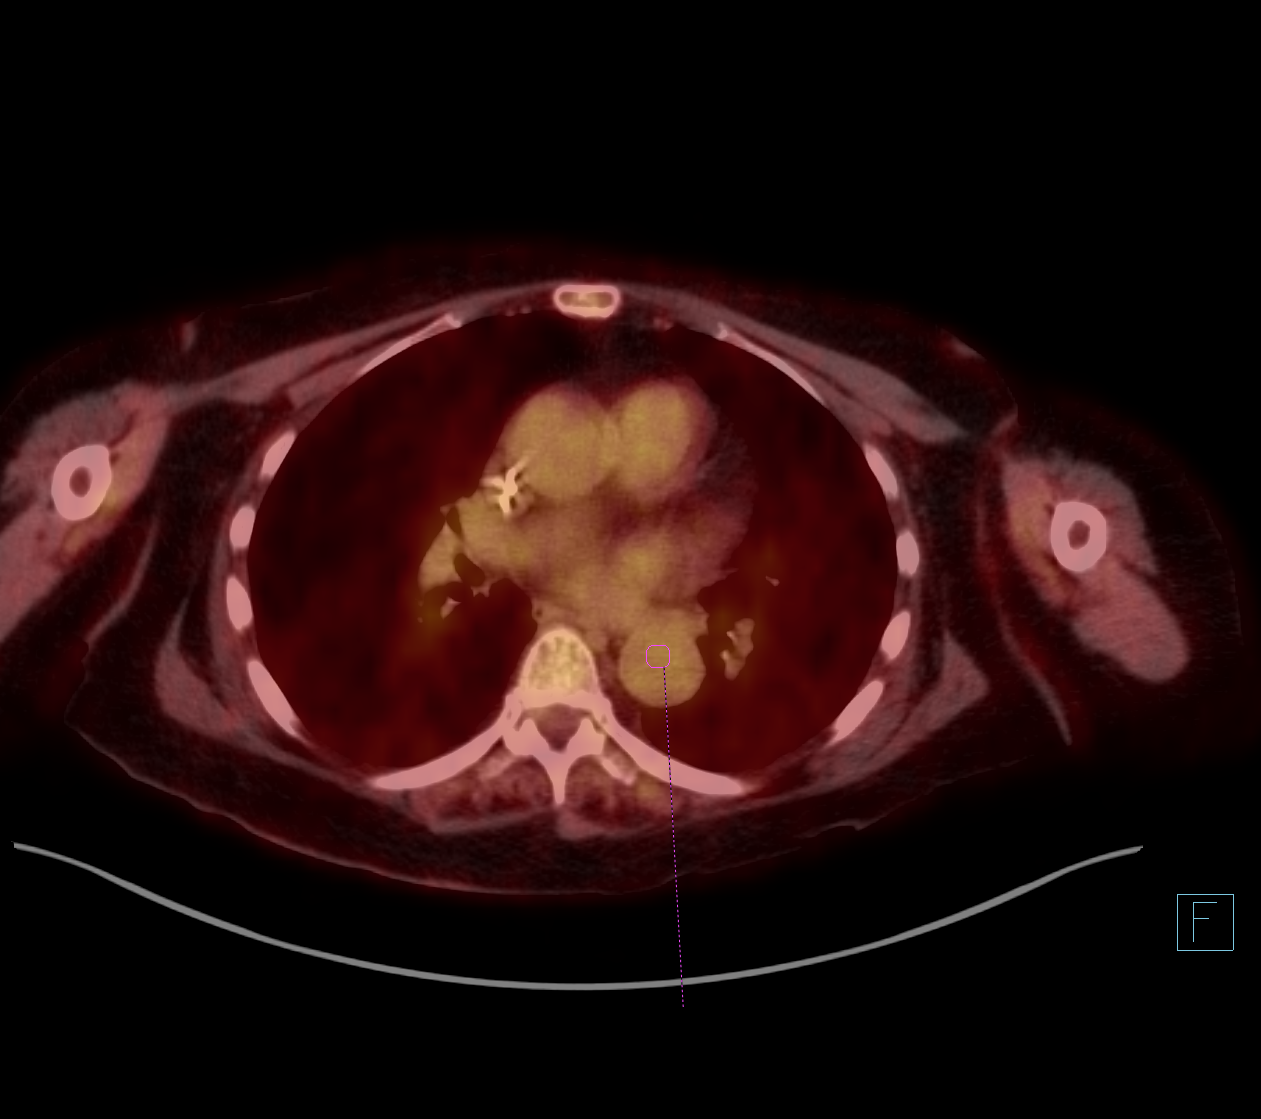

[1 of 1 positions shown; findings below may reference images not displayed]

FINDINGS: Mediastinal blood pool activity: SUV max

NECK:

No hypermetabolic cervical lymph nodes are identified.There are no
lesions of the pharyngeal mucosal space.

Incidental CT findings: Extensive carotid atherosclerosis
bilaterally.

CHEST:

There are no hypermetabolic mediastinal, hilar or axillary lymph
nodes. No hypermetabolic pulmonary activity or suspicious
nodularity.

Incidental CT findings: Extensive atherosclerosis of the aorta,
great vessels and coronary arteries with probable calcifications of
the aortic valve. Mild centrilobular and paraseptal emphysema.

ABDOMEN/PELVIS:

There is no hypermetabolic activity within the liver, adrenal
glands, spleen or pancreas. There is no hypermetabolic nodal
activity. Focal hypermetabolic activity at the LA BARBIE (SUV max 9.5).
No other abnormal bowel activity identified.

Incidental CT findings: There are multiple low-density and
intermediate density renal lesions bilaterally which are
incompletely characterized without contrast, but are likely cysts of
varying complexity. There is a cystic appearing retroperitoneal mass
at the level of the uncinate process of the pancreas, measuring
x 3.3 cm on image 173/3. This demonstrates no hypermetabolic
activity and appears benign. There is diffuse aortic and branch
vessel atherosclerosis. Peritoneal dialysis catheter is noted with
mild pelvic ascites.

SKELETON:

There is no hypermetabolic activity to suggest osseous metastatic
disease.

Incidental CT findings: Mild degenerative changes in the spine and
right shoulder.
IMPRESSION: 1. The patient's known anal cancer is hypermetabolic. No evidence of
regional adenopathy or distant metastatic disease.
2. Multiple renal masses without hypermetabolic activity,
incompletely characterized without contrast. These likely represent
cysts of varying complexity.
3. Cystic retroperitoneal mass adjacent to the uncinate process of
the pancreas, likely benign. This and the renal lesions can be
addressed on follow-up surveillance imaging. If clinically
warranted, dedicated abdominal MRI without and with contrast could
be performed.
4. Coronary common carotid and Aortic Atherosclerosis ([IG]-[IG]).

## 2021-12-21 IMAGING — CT NM PET TUM IMG INITIAL (PI) SKULL BASE T - THIGH
1 of 7 series · 1 of 25 positions shown · non-contrast
Comparison: None.

CLINICAL DATA: Initial treatment strategy for anal cancer.

EXAM:
NUCLEAR MEDICINE PET SKULL BASE TO THIGH
TECHNIQUE: 8.59 mCi F-18 FDG was injected intravenously. Full-ring PET imaging
was performed from the skull base to thigh after the radiotracer. CT
data was obtained and used for attenuation correction and anatomic
localization.
Fasting blood glucose: 106 mg/dl

[Series 3: ctac · axial · 3.0mm · 0.98mm/px · 1 of 323 slices shown]
[im 323/323  brain]
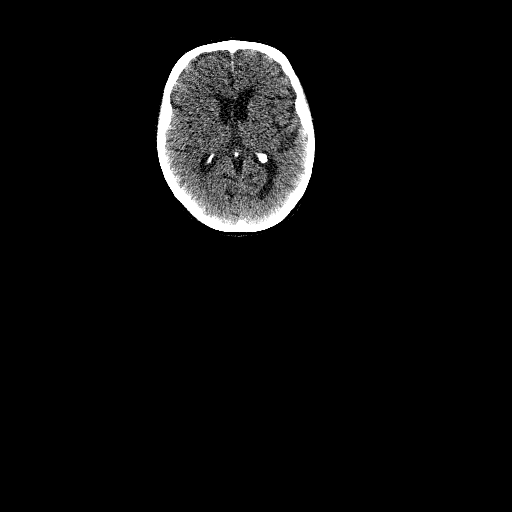

[1 of 25 positions shown; findings below may reference images not displayed]

FINDINGS: Mediastinal blood pool activity: SUV max

NECK:

No hypermetabolic cervical lymph nodes are identified.There are no
lesions of the pharyngeal mucosal space.

Incidental CT findings: Extensive carotid atherosclerosis
bilaterally.

CHEST:

There are no hypermetabolic mediastinal, hilar or axillary lymph
nodes. No hypermetabolic pulmonary activity or suspicious
nodularity.

Incidental CT findings: Extensive atherosclerosis of the aorta,
great vessels and coronary arteries with probable calcifications of
the aortic valve. Mild centrilobular and paraseptal emphysema.

ABDOMEN/PELVIS:

There is no hypermetabolic activity within the liver, adrenal
glands, spleen or pancreas. There is no hypermetabolic nodal
activity. Focal hypermetabolic activity at the [place] BARBIE (SUV max 9.5).
No other abnormal bowel activity identified.

Incidental CT findings: There are multiple low-density and
intermediate density renal lesions bilaterally which are
incompletely characterized without contrast, but are likely cysts of
varying complexity. There is a cystic appearing retroperitoneal mass
at the level of the uncinate process of the pancreas, measuring
x 3.3 cm on image 173/3. This demonstrates no hypermetabolic
activity and appears benign. There is diffuse aortic and branch
vessel atherosclerosis. Peritoneal dialysis catheter is noted with
mild pelvic ascites.

SKELETON:

There is no hypermetabolic activity to suggest osseous metastatic
disease.

Incidental CT findings: Mild degenerative changes in the spine and
right shoulder.
IMPRESSION: 1. The patient's known anal cancer is hypermetabolic. No evidence of
regional adenopathy or distant metastatic disease.
2. Multiple renal masses without hypermetabolic activity,
incompletely characterized without contrast. These likely represent
cysts of varying complexity.
3. Cystic retroperitoneal mass adjacent to the uncinate process of
the pancreas, likely benign. This and the renal lesions can be
addressed on follow-up surveillance imaging. If clinically
warranted, dedicated abdominal MRI without and with contrast could
be performed.
4. Coronary common carotid and Aortic Atherosclerosis ([IG]-[IG]).

## 2021-12-21 MED ORDER — FLUDEOXYGLUCOSE F - 18 (FDG) INJECTION
8.5870 | Freq: Once | INTRAVENOUS | Status: AC | PRN
  Administered 2021-12-21: 8.587 via INTRAVENOUS

## 2021-12-22 LAB — METHYLMALONIC ACID, SERUM: Methylmalonic Acid, Quantitative: 523 nmol/L — ABNORMAL HIGH (ref 0–378)

## 2021-12-23 LAB — COPPER, SERUM: Copper: 82 ug/dL (ref 69–132)

## 2021-12-26 ENCOUNTER — Other Ambulatory Visit: Payer: Self-pay

## 2021-12-26 ENCOUNTER — Inpatient Hospital Stay (HOSPITAL_BASED_OUTPATIENT_CLINIC_OR_DEPARTMENT_OTHER): Payer: Medicare Other | Admitting: Hematology

## 2021-12-26 VITALS — BP 129/65 | HR 72 | Temp 97.7°F | Resp 16 | Ht 71.0 in | Wt 159.9 lb

## 2021-12-26 DIAGNOSIS — C210 Malignant neoplasm of anus, unspecified: Secondary | ICD-10-CM | POA: Diagnosis not present

## 2021-12-26 DIAGNOSIS — D518 Other vitamin B12 deficiency anemias: Secondary | ICD-10-CM

## 2021-12-26 NOTE — Progress Notes (Signed)
[place], 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[place])  11/09/2019 Cancer Staging   Staging form: Larynx - Glottis, AJCC 8th Edition - Clinical stage from 11/09/2019: Stage 0 (cTis, cN0, cM0) - Signed by Jason Gibson, MD on 11/10/2019    11/10/2019 Initial Diagnosis   Carcinoma of glottis [facility])     CANCER STAGING:  Cancer Staging  Anal cancer ([place]) Staging form: Anus, AJCC V9 - Clinical stage from 12/20/2021: Stage IIA (cT2, cN0, cM0) - Unsigned  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, a 88 y.o. male, returns for routine follow-up of his rectal cancer. Jason Avila was last seen on 12/20/2020.   Today he reports feeling fair, and he is accompanied by his wife; his daughter is listening in via telephone. He denies any current pain or bleeding. He is currently taking stool softener for constipation.   REVIEW OF SYSTEMS:  Review of Systems  Constitutional:  Positive for appetite change and fatigue.  Gastrointestinal:  Positive for constipation.  Musculoskeletal:  Negative for arthralgias and myalgias.  All other systems reviewed and are negative.  PAST MEDICAL/SURGICAL HISTORY:  Past Medical History:  Diagnosis Date   Anemia    Aortic valve stenosis    mild AS 04/2017 (followed by Dr. 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   Chronic kidney disease    ckd stage 4, bilateral cysts on kidneys [facility])    Dysrhythmia    RBBB   GERD (gastroesophageal reflux disease)    Gout    left foot   Heart murmur    no problems   History of blood transfusion 2018   History of radiation therapy 11/18/19- 12/16/19   Larynx 54 Gy in 20 fx   Hyperlipidemia    Hypertension    Sleep apnea    Subdural hematoma 11/2018   Wears glasses    Past Surgical History:  Procedure Laterality Date   BURR HOLE FOR SUBDURAL HEMATOMA  12/15/2018   brain   COLONOSCOPY     EYE SURGERY Bilateral    cataracts removed   MICROLARYNGOSCOPY N/A 10/23/2019   Procedure: Microlaryngoscopy with vocal cord lesion biopsy;  Surgeon: Jason Belfast, MD;  Location: [facility];  Service: ENT;  Laterality: N/A;   UPPER GI ENDOSCOPY      SOCIAL HISTORY:  Social History   Socioeconomic History   Marital status: Married    Spouse name: Not on file   Number of children: Not on file   Years of education: Not on file   Highest education level: Not on file  Occupational History   Not on file  Tobacco Use   Smoking status: Former    Years: 40.00    Types: Cigarettes   Smokeless tobacco: Never   Tobacco comments:    Quit in 1998  Vaping Use   Vaping Use: Never used  Substance and Sexual Activity   Alcohol use:  Never   Drug use: Never   Sexual activity: Not on file  Other Topics Concern   Not on file  Social History Narrative   Not on file   Social Determinants of Health   Financial Resource Strain: Not on file  Food Insecurity: Not on file  Transportation Needs: Not on file  Physical Activity: Not on file  Stress: Not on file  Social Connections: Not on file  Intimate Partner Violence: Not on file    FAMILY HISTORY:  No family history on file.  CURRENT MEDICATIONS:  Current Outpatient Medications  Medication Sig Dispense Refill   Amino Acids (LIQUACEL) LIQD Take by mouth.     amLODipine (NORVASC) 5 MG tablet Take 5 mg by mouth daily.      atorvastatin (LIPITOR) 20 MG tablet Take 20 mg by mouth daily at 6 PM.       Cholecalciferol 25 MCG (1000 UT) capsule Take 1 capsule by mouth daily.     Docusate Sodium (DSS) 100 MG CAPS Take by mouth.     epoetin alfa (EPOGEN) 10000 UNIT/ML injection Inject 1 Units into the skin every 14 (fourteen) days.     GNP STOOL SOFTENER/LAXATIVE 8.6-50 MG tablet Take 1 tablet by mouth every other day.     hydrALAZINE (APRESOLINE) 100 MG tablet Take 1 tablet by mouth 3 (three) times daily.     levothyroxine (SYNTHROID) 25 MCG tablet Take 1 tablet by mouth every morning.     mirtazapine (REMERON) 15 MG tablet Take 15 mg by mouth daily.     pantoprazole (PROTONIX) 40 MG tablet Take 40 mg by mouth every morning.     sucroferric oxyhydroxide (VELPHORO) 500 MG chewable tablet Chew 500 tablets by mouth daily.     No current facility-administered medications for this visit.    ALLERGIES:  No Known Allergies  PHYSICAL EXAM:  Performance status (ECOG): 1 - Symptomatic but completely ambulatory  Vitals:   12/26/21 1130  BP: 129/65  Pulse: 72  Resp: 16  Temp: 97.7 F (36.5 C)  SpO2: 98%   Wt Readings from Last 3 Encounters:  12/26/21 159 lb 14.4 oz (72.5 kg)  12/20/21 158 lb 12.8 oz (72 kg)  10/13/21 165 lb 3.2 oz (74.9 kg)   Physical Exam Vitals reviewed.  Constitutional:      Appearance: Normal appearance.  Cardiovascular:     Rate and Rhythm: Normal rate and regular rhythm.     Pulses: Normal pulses.     Heart sounds: Normal heart sounds.  Pulmonary:     Effort: Pulmonary effort is normal.     Breath sounds: Normal breath sounds.  Neurological:     General: No focal deficit present.     Mental Status: He is alert and oriented to person, place, and time.  Psychiatric:        Mood and Affect: Mood normal.        Behavior: Behavior normal.     LABORATORY DATA:  I have reviewed the labs as listed.  CBC Latest Ref Rng & Units 12/20/2021 10/23/2019  WBC 4.0 - 10.5 K/uL 2.6(L) 3.8(L)  Hemoglobin 13.0 - 17.0 g/dL 11.3(L) 10.0(L)  Hematocrit 39.0 - 52.0 %  35.7(L) 31.5(L)  Platelets 150 - 400 K/uL 190 224   CMP Latest Ref Rng & Units 10/23/2019  Glucose 70 - 99 mg/dL 102(H)  BUN 8 - 23 mg/dL 42(H)  Creatinine 0.61 - 1.24 mg/dL 3.86(H)  Sodium 135 - 145 mmol/L 140  Potassium 3.5 -  5.1 mmol/L 3.3(L)  Chloride 98 - 111 mmol/L 107  CO2 22 - 32 mmol/L 24  Calcium 8.9 - 10.3 mg/dL 8.7(L)    DIAGNOSTIC IMAGING:  I have independently reviewed the scans and discussed with the patient. NM PET Image Initial (PI) Skull Base To Thigh (F-18 FDG)  Result Date: 12/22/2021 CLINICAL DATA:  Initial treatment strategy for anal cancer. EXAM: NUCLEAR MEDICINE PET SKULL BASE TO THIGH TECHNIQUE: 8.59 mCi F-18 FDG was injected intravenously. Full-ring PET imaging was performed from the skull base to thigh after the radiotracer. CT data was obtained and used for attenuation correction and anatomic localization. Fasting blood glucose: 106 mg/dl COMPARISON:  None. FINDINGS: Mediastinal blood pool activity: SUV max 1.9 NECK: No hypermetabolic cervical lymph nodes are identified.There are no lesions of the pharyngeal mucosal space. Incidental CT findings: Extensive carotid atherosclerosis bilaterally. CHEST: There are no hypermetabolic mediastinal, hilar or axillary lymph nodes. No hypermetabolic pulmonary activity or suspicious nodularity. Incidental CT findings: Extensive atherosclerosis of the aorta, great vessels and coronary arteries with probable calcifications of the aortic valve. Mild centrilobular and paraseptal emphysema. ABDOMEN/PELVIS: There is no hypermetabolic activity within the liver, adrenal glands, spleen or pancreas. There is no hypermetabolic nodal activity. Focal hypermetabolic activity at the anise (SUV max 9.5). No other abnormal bowel activity identified. Incidental CT findings: There are multiple low-density and intermediate density renal lesions bilaterally which are incompletely characterized without contrast, but are likely cysts of varying complexity.  There is a cystic appearing retroperitoneal mass at the level of the uncinate process of the pancreas, measuring 5.1 x 3.3 cm on image 173/3. This demonstrates no hypermetabolic activity and appears benign. There is diffuse aortic and branch vessel atherosclerosis. Peritoneal dialysis catheter is noted with mild pelvic ascites. SKELETON: There is no hypermetabolic activity to suggest osseous metastatic disease. Incidental CT findings: Mild degenerative changes in the spine and right shoulder. IMPRESSION: 1. The patient's known anal cancer is hypermetabolic. No evidence of regional adenopathy or distant metastatic disease. 2. Multiple renal masses without hypermetabolic activity, incompletely characterized without contrast. These likely represent cysts of varying complexity. 3. Cystic retroperitoneal mass adjacent to the uncinate process of the pancreas, likely benign. This and the renal lesions can be addressed on follow-up surveillance imaging. If clinically warranted, dedicated abdominal MRI without and with contrast could be performed. 4. Coronary common carotid and Aortic Atherosclerosis (ICD10-I70.0). Electronically Signed   By: Richardean Sale M.D.   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 - EGD and colonoscopy on 12/08/2021 with grossly normal EGD.  Colonoscopy showed 2 cm mass with cratered ulcer with distal margin abutting the anal verge. 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 - Reports 30 pound weight loss unintentional in the last 6 months.  He was also started on hemodialysis in September 2022. - MRI rectal cancer protocol (at [facility] clinic) shows T1/T2.  No lymph nodes were seen.  Craniocaudal length of the tumor is 3.3 cm. - Rectal exam showed mass palpable in the anal canal about 2 cm from the  anal verge, predominantly on the right side.    Social/family history: - Lives at home with his wife.  He is a retired Glass blower/designer at [employer].  Quit smoking 24 years ago.  No chemical exposure.  No family history of malignancies.  3.  ESRD: -  He is on peritoneal dialysis at home, started on 08/23/2021. - He is followed by Dr. Elita Quick in [place].  4.  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   PLAN:  T2 squamous cell carcinoma of the anal canal, p16 positive: - We have reviewed PET scan findings from 12/22/2021 which showed no evidence of regional adenopathy.  Anal cancer is hypermetabolic.  Multiple renal masses without hypermetabolic activity.  Cystic retroperitoneal mass adjacent to the uncinate process of the pancreas likely benign. - We discussed the treatment plan for locoregional anal cancer with chemoradiation therapy using 5-FU and mitomycin. - Capecitabine is not well studied in dialysis patients.  5-FU dose need not be reduced.  However mitomycin dose should be reduced to 25%. - We will plan to do continuous infusional 5-FU 1000 mg per metered squared per day on days 1 through 4 and 29-32.  We will give mitomycin 25% of the dose on days 1 and 29.  We discussed the side effects.  Literature was given. - He will need port placement.  We will make a referral to our surgeon. - We will also refer to radiation oncology. - RTC 3 weeks for follow-up.  2.  Normocytic anemia: - Work-up for normocytic anemia on 12/20/2021 showed ferritin 330 and percent saturation 46.  Folic acid and U27 were normal.  However MMA was elevated at 523.  Copper was normal.  SPEP was negative.  Hemoglobin improved to 11.3.  White count is 2.6 with ANC of 1.5. - We will start him on B12 1 mg tablet daily.  3.  Constipation: - He is taking stool softener daily.  Denies any bleeding per rectum. - We have given our  constipation protocol copy.   Orders placed this encounter:  No orders of the defined types were placed in this encounter.    Derek Jack, MD [place] [phone]   I, Thana Ates, am acting as a scribe for Dr. Derek Jack.  I, Derek Jack MD, have reviewed the above documentation for accuracy and completeness, and I agree with the above.

## 2021-12-26 NOTE — Progress Notes (Signed)
START ON PATHWAY REGIMEN - Anal Carcinoma     One cycle, concurrent with RT:     Fluorouracil      Mitomycin   **Always confirm dose/schedule in your pharmacy ordering system**  Patient Characteristics: Anal Canal Tumors, Newly Diagnosed - Locoregional Disease (Clinical Staging) Therapeutic Status: Newly Diagnosed - Locoregional Disease (Clinical Staging) AJCC T Category: cT2 AJCC N Category: cN0 AJCC M Category: cM0 AJCC 9 Stage Grouping: IIA Check here if patient was staged using an edition other than AJCC Staging 9th Edition: false Intent of Therapy: Curative Intent, Discussed with Patient 

## 2021-12-26 NOTE — Patient Instructions (Addendum)
[place] at [facility] Discharge Instructions   You were seen and examined today by Dr. Delton Coombes.  He reviewed the results of your PET scan.  It shows that no cancer has spread throughout your body.  He discussed treatment option of fluorouracil (5FU) and mitomycin to be given along with radiation therapy.   We will also arrange for you to have a port a cath placed for treatment.  Return as scheduled.      Thank you for choosing [place] at [facility] to provide your oncology and hematology care.  To afford each patient quality time with our provider, please arrive at least 15 minutes before your scheduled appointment time.   If you have a lab appointment with the [facility] please come in thru the Main Entrance and check in at the main information desk.  You need to re-schedule your appointment should you arrive 10 or more minutes late.  We strive to give you quality time with our providers, and arriving late affects you and other patients whose appointments are after yours.  Also, if you no show three or more times for appointments you may be dismissed from the clinic at the providers discretion.     Again, thank you for choosing [facility].  Our hope is that these requests will decrease the amount of time that you wait before being seen by our physicians.       _____________________________________________________________  Should you have questions after your visit to [facility], please contact our office at [phone] and follow the prompts.  Our office hours are 8:00 a.m. and 4:30 p.m. Monday - Friday.  Please note that voicemails left after 4:00 p.m. may not be returned until the following business day.  We are closed weekends and major holidays.  You do have access to a nurse 24-7, just call the main number to the clinic [phone] and do not press any options, hold on the line and a nurse will  answer the phone.    For prescription refill requests, have your pharmacy contact our office and allow 72 hours.    Due to Covid, you will need to wear a mask upon entering the hospital. If you do not have a mask, a mask will be given to you at the Main Entrance upon arrival. For doctor visits, patients may have 1 support person age 46 or older with them. For treatment visits, patients can not have anyone with them due to social distancing guidelines and our immunocompromised population.

## 2021-12-27 ENCOUNTER — Encounter (HOSPITAL_COMMUNITY): Payer: Self-pay

## 2021-12-27 ENCOUNTER — Encounter (HOSPITAL_COMMUNITY): Payer: Self-pay | Admitting: Lab

## 2021-12-27 NOTE — Progress Notes (Unsigned)
Referral to Rad Onc in [place] .  Records faxed on 1/11

## 2021-12-27 NOTE — Progress Notes (Signed)
Patient provided with written information on Mitomycin and 5FU as discussed by Dr. Delton Coombes. Call placed to [place], RN at [facility] regarding patient referral. [place] awaiting referral and will schedule patient for consultation asap. Chemotherapy start date pending, patient and wife aware and verbalized understanding.

## 2021-12-28 NOTE — Patient Instructions (Addendum)
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  We will treat you in the clinic during the first week of radiation and the last week of radiation.  The drugs you will receive are called mitomycin and fluorouracil (5FU).  The mitomycin is a drug that we give in the clinic.  It comes in a syringe that we will give you through your port. Once we give that drug, we will hook you up to an ambulatory infusion pump that contains the 5FU.  We will connect this pump to your port a cath on Monday and you will wear it Monday-Thursday, and return on Friday to have the pump removed.  The intent of treatment is cure.  You will see the doctor regularly throughout treatment.  We will obtain blood work from you prior to every treatment and monitor your results to make sure it is safe to give your treatment. The doctor monitors your response to treatment by the way you are feeling, your blood work, and by obtaining scans periodically.  There will be wait times while you are here for treatment.  It will take about 30 minutes to 1 hour for your lab work to result.  Then there will be wait times while pharmacy mixes your medications.     Mitomycin   About This Drug   Mitomycin is used to treat cancer. It is given in the vein (IV).   Possible Side Effects   Bone marrow suppression. This is a decrease in the number of white blood cells, red blood cells, and platelets. This may raise your risk of infection, make you tired and weak, and raise your risk of bleeding.    Fever    Soreness of the mouth and throat. You may have red areas, white patches, or sores that hurt.    Nausea and vomiting (throwing up)    Decreased appetite (decreased hunger)    Skin and tissue irritation including redness, pain, warmth, or swelling at the IV site if the drug leaks out of the vein and into nearby tissue. Very rarely it may cause local tissue necrosis (death).     Hair loss. Hair loss is often temporary, although with certain medicine, hair loss can sometimes be permanent. Hair loss may happen suddenly or gradually. If you lose hair, you may lose it from your head, face, armpits, pubic area, chest, and/or legs. You may also notice your hair getting thin. Note: Not all possible side effects are included above.   Warnings and Precautions   Severe bone marrow suppression, which can be life-threatening.    Changes in your kidney function    A syndrome that affects your red blood cells, platelets and blood vessels in your kidneys, which can cause kidney failure and be life-threatening.    Inflammation (swelling) of the lungs. You may have a dry cough or trouble breathing.   Note: Some of the side effects above are very rare. If you have concerns and/or questions, please discuss them with your medical team.   Important Information    This drug may be present in the saliva, tears, sweat, urine, stool, vomit, semen, and vaginal secretions. Talk to your doctor and/or your nurse about the necessary precautions to take during this time.   Treating Side Effects   Manage tiredness by pacing your activities for the day.    Be sure to include periods of rest between energy-draining activities.    To  decrease the risk of infection, wash your hands regularly.    Avoid close contact with people who have a cold, the flu, or other infections.    Take your temperature as your doctor or nurse tells you, and whenever you feel like you may have a fever.    To help decrease the risk of bleeding, use a soft toothbrush. Check with your nurse before using dental floss.    Be very careful when using knives or tools.  Use an electric shaver instead of a razor.    Mouth care is very important. Your mouth care should consist of routine, gentle cleaning of your teeth or dentures and rinsing your mouth with a mixture of 1/2 teaspoon of salt in 8 ounces of water or 1/2 teaspoon  of baking soda in 8 ounces of water. This should be done at least after each meal and at bedtime.    If you have mouth sores, avoid mouthwash that has alcohol. Also avoid alcohol and smoking because they can bother your mouth and throat.  Drink plenty of fluids (a minimum of eight glasses per day is recommended). More may be recommended by your doctor.    If you throw up, you should drink more fluids so that you do not become dehydrated (lack of water in the body from losing too much fluid).    To help with nausea and vomiting, eat small, frequent meals instead of three large meals a day. Choose foods and drinks that are at room temperature. Ask your nurse or doctor about other helpful tips and medicine that is available to help stop or lessen these symptoms.    To help with decreased appetite, eat small, frequent meals. Eat foods high in calories and protein, such as meat, poultry, fish, dry beans, tofu, eggs, nuts, milk, yogurt, cheese, ice cream, pudding, and nutritional supplements.    Consider using sauces and spices to increase taste. Daily exercise, with your doctors approval, may increase your appetite.    To help with hair loss, wash with a mild shampoo and avoid washing your hair every day. Avoid coloring your hair.    Avoid rubbing your scalp, pat your hair or scalp dry.    Limit your use of hair spray, electric curlers, blow dryers, and curling irons.    If you are interested in getting a wig, talk to your nurse and they can help you get in touch with programs in your local area.   Food and Drug Interactions   There are no known interactions of mitomycin with food.     This drug may interact with other medicines. Tell your doctor and pharmacist about all the prescription and over-the-counter medicines and dietary supplements (vitamins, minerals, herbs, and others) that you are taking at this time. Also, check with your doctor or pharmacist before starting any new prescription or  over-the-counter medicines, or dietary supplements to make sure that there are no interactions.   When to Call the Doctor  Call your doctor or nurse if you have any of these symptoms and/or any new or unusual symptoms:    Fever of 100.4 F (38 C) or higher    Chills    Tiredness that interferes with your daily activities    Feeling dizzy or lightheaded    Easy bleeding or bruising    Wheezing and/or trouble breathing    Pain in your chest    Dry cough    Pain in your mouth or throat that makes it  hard to eat or drink    Nausea that stops you from eating or drinking and/or is not relieved by prescribed medicines    Throwing up more than 3 times a day    Lasting loss of appetite or rapid weight loss of five pounds in a week    Swelling of the face, hands, feet, or any other part of the body    Decreased or very dark urine    While you are getting this drug, please tell your nurse right away if you have any pain, redness, or swelling at the site of the IV infusion.    If you think you may be pregnant   Reproduction Warnings    Pregnancy warning: It is not known if this drug may harm an unborn child. For this reason, be sure to talk with your doctor if you are pregnant or planning to become pregnant while receiving this drug. Let your doctor know right away if you think you may be pregnant.    Breastfeeding warning: It is not known if this drug passes into breast milk. For this reason, women should talk to their doctor about the risks and benefits of breastfeeding during treatment with this drug because this drug may enter the breast milk and cause harm to a breastfeeding baby.    Fertility warning: Fertility studies have not been done with this drug. Talk with your doctor or nurse if you plan to have children. Ask for information on sperm or egg banking.    5-Fluorouracil (Adrucil; 5FU)  About This Drug  Fluorouracil is used to treat cancer. It is given in the vein  (IV). It is given as an IV push from a syringe and also as a continuous infusion given via an ambulatory pump (a pump you take home and wear for a specified amount of time).  Possible Side Effects   Bone marrow suppression. This is a decrease in the number of white blood cells, red blood cells, and platelets. This may raise your risk of infection, make you tired and weak (fatigue), and raise your risk of bleeding   Changes in the tissue of the heart and/or heart attack. Some changes may happen that can cause your heart to have less ability to pump blood.   Blurred vision or other changes in eyesight   Nausea and throwing up (vomiting)   Diarrhea (loose bowel movements)   Ulcers - sores that may cause pain or bleeding in your digestive tract, which includes your mouth, esophagus, stomach, small/large intestines and rectum   Soreness of the mouth and throat. You may have red areas, white patches, or sores that hurt.   Allergic reactions, including anaphylaxis are rare but may happen in some patients. Signs of allergic reaction to this drug may be swelling of the face, feeling like your tongue or throat are swelling, trouble breathing, rash, itching, fever, chills, feeling dizzy, and/or feeling that your heart is beating in a fast or not normal way. If this happens, do not take another dose of this drug. You should get urgent medical treatment.   Sensitivity to light (photosensitivity). Photosensitivity means that you may become more sensitive to the sun and/or light. You may get a skin rash/reaction if you are in the sun or are exposed to sun lamps and tanning beds. Your eyes may water more, mostly in bright light.   Changes in your nail color, nail loss and/or brittle nail   Darkening of the skin, or changes to the  color of your skin and/or veins used for infusion   Rash, dry skin, or itching  Note: Not all possible side effects are included above.  Warnings and Precautions    Hand-and-foot syndrome. The palms of your hands or soles of your feet may tingle, become numb, painful, swollen, or red.   Changes in your central nervous system can happen. The central nervous system is made up of your brain and spinal cord. You could feel extreme tiredness, agitation, confusion, hallucinations (see or hear things that are not there), trouble understanding or speaking, loss of control of your bowels or bladder, eyesight changes, numbness or lack of strength to your arms, legs, face, or body, or coma. If you start to have any of these symptoms let your doctor know right away.   Side effects of this drug may be unexpectedly severe in some patients  Note: Some of the side effects above are very rare. If you have concerns and/or questions, please discuss them with your medical team.   Important Information   This drug may be present in the saliva, tears, sweat, urine, stool, vomit, semen, and vaginal secretions. Talk to your doctor and/or your nurse about the necessary precautions to take during this time.   Treating Side Effects   Manage tiredness by pacing your activities for the day.   Be sure to include periods of rest between energy-draining activities.   To help decrease the risk of infections, wash your hands regularly.   Avoid close contact with people who have a cold, the flu, or other infections.   Take your temperature as your doctor or nurse tells you, and whenever you feel like you may have a fever.   Use a soft toothbrush. Check with your nurse before using dental floss.   Be very careful when using knives or tools.   Use an electric shaver instead of a razor.   If you have a nose bleed, sit with your head tipped slightly forward. Apply pressure by lightly pinching the bridge of your nose between your thumb and forefinger. Call your doctor if you feel dizzy or faint or if the bleeding doesnt stop after 10 to 15 minutes.   Drink plenty of fluids (a  minimum of eight glasses per day is recommended).   If you throw up or have loose bowel movements, you should drink more fluids so that you do not  become dehydrated (lack of water in the body from losing too much fluid).   To help with nausea and vomiting, eat small, frequent meals instead of three large meals a day. Choose foods and drinks that are at room temperature. Ask your nurse or doctor about other helpful tips and medicine that is available to help, stop, or lessen these symptoms.   If you have diarrhea, eat low-fiber foods that are high in protein and calories and avoid foods that can irritate your digestive tracts or lead to cramping.   Ask your nurse or doctor about medicine that can lessen or stop your diarrhea.   Mouth care is very important. Your mouth care should consist of routine, gentle cleaning of your teeth or dentures and rinsing your mouth with a mixture of 1/2 teaspoon of salt in 8 ounces of water or 1/2 teaspoon of baking soda in 8 ounces of water. This should be done at least after each meal and at bedtime.   If you have mouth sores, avoid mouthwash that has alcohol. Also avoid alcohol and smoking because they  can bother your mouth and throat.   Keeping your nails moisturized may help with brittleness.   To help with itching, moisturize your skin several times day.   Use sunscreen with SPF 30 or higher when you are outdoors even for a short time. Cover up when you are out in the sun. Wear wide-brimmed hats, long-sleeved shirts, and pants. Keep your neck, chest, and back covered. Wear dark sun glasses when in the sun or bright lights.   If you get a rash do not put anything on it unless your doctor or nurse says you may. Keep the area around the rash clean and dry. Ask your doctor for medicine if your rash bothers you.   Keeping your pain under control is important to your well-being. Please tell your doctor or nurse if you are experiencing pain.   Food and Drug  Interactions   There are no known interactions of fluorouracil with food.   Check with your doctor or pharmacist about all other prescription medicines and over-the-counter medicines and dietary supplements (vitamins, minerals, herbs and others) you are taking before starting this medicine as there are known drug interactions with 5-fluoroucacil. Also, check with your doctor or pharmacist before starting any new prescription or over-the-counter medicines, or dietary supplements to make sure that there are no interactions.  When to Call the Doctor  Call your doctor or nurse if you have any of these symptoms and/or any new or unusual symptoms:   Fever of 100.4 F (38 C) or higher   Chills   Easy bleeding or bruising   Nose bleed that doesnt stop bleeding after 10-15 minutes   Trouble breathing   Feeling dizzy or lightheaded   Feeling that your heart is beating in a fast or not normal way (palpitations)   Chest pain or symptoms of a heart attack. Most heart attacks involve pain in the center of the chest that lasts more than a few minutes. The pain may go away and come back or it can be constant. It can feel like pressure, squeezing, fullness, or pain. Sometimes pain is felt in one or both arms, the back, neck, jaw, or stomach. If any of these symptoms last 2 minutes, call 911.   Confusion and/or agitation   Hallucinations   Trouble understanding or speaking   Loss of control of bowels or bladder   Blurry vision or changes in your eyesight   Headache that does not go away   Numbness or lack of strength to your arms, legs, face, or body   Nausea that stops you from eating or drinking and/or is not relieved by prescribed medicines   Throwing up more than 3 times a day   Diarrhea, 4 times in one day or diarrhea with lack of strength or a feeling of being dizzy   Pain in your mouth or throat that makes it hard to eat or drink   Pain along the digestive tract - especially if  worse after eating   Blood in your vomit (bright red or coffee-ground) and/or stools (bright red, or black/tarry)   Coughing up blood   Tiredness that interferes with your daily activities   Painful, red, or swollen areas on your hands or feet or around your nails   A new rash or a rash that is not relieved by prescribed medicines   Develop sensitivity to sunlight/light   Numbness and/or tingling of your hands and/or feet   Signs of allergic reaction: swelling of the face,  feeling like your tongue or throat are swelling, trouble breathing, rash, itching, fever, chills, feeling dizzy, and/or feeling that your heart is beating in a fast or not normal way. If this happens, call 911 for emergency care.   If you think you are pregnant or may have impregnated your partner  Reproduction Warnings   Pregnancy warning: This drug may have harmful effects on the unborn baby. Women of child bearing potential should use effective methods of birth control during your cancer treatment and 3 months after treatment. Men with male partners of childbearing potential should use effective methods of birth control during your cancer treatment and for 3 months after your cancer treatment. Let your doctor know right away if you think you may be pregnant or may have impregnated your partner.   Breastfeeding warning: It is not known if this drug passes into breast milk. For this reason, Women should not breastfeed during treatment because this drug could enter the breast milk and cause harm to a breastfeeding baby.   Fertility warning: In men and women both, this drug may affect your ability to have children in the future. Talk with your doctor or nurse if you plan to have children. Ask for information on sperm or egg banking.   SELF CARE ACTIVITIES WHILE ON CHEMOTHERAPY/IMMUNOTHERAPY:  Hydration Increase your fluid intake 48 hours prior to treatment and drink at least 8 to 12 cups (64 ounces) of  water/decaffeinated beverages per day after treatment. You can still have your cup of coffee or soda but these beverages do not count as part of your 8 to 12 cups that you need to drink daily. No alcohol intake.  Medications Continue taking your normal prescription medication as prescribed.  If you start any new herbal or new supplements please let us know first to make sure it is safe.  Mouth Care Have teeth cleaned professionally before starting treatment. Keep dentures and partial plates clean. Use soft toothbrush and do not use mouthwashes that contain alcohol. Biotene is a good mouthwash that is available at most pharmacies or may be ordered by calling [phone]. Use warm salt water gargles (1 teaspoon salt per 1 quart warm water) before and after meals and at bedtime. Or you may rinse with 2 tablespoons of three-percent hydrogen peroxide mixed in eight ounces of water. If you are still having problems with your mouth or sores in your mouth please call the clinic. If you need dental work, please let the doctor know before you go for your appointment so that we can coordinate the best possible time for you in regards to your chemo regimen. You need to also let your dentist know that you are actively taking chemo. We may need to do labs prior to your dental appointment.  Skin Care Always use sunscreen that has not expired and with SPF (Sun Protection Factor) of 50 or higher. Wear hats to protect your head from the sun. Remember to use sunscreen on your hands, ears, face, & feet.  Use good moisturizing lotions such as udder cream, eucerin, or even Vaseline. Some chemotherapies can cause dry skin, color changes in your skin and nails.    Avoid long, hot showers or baths. Use gentle, fragrance-free soaps and laundry detergent. Use moisturizers, preferably creams or ointments rather than lotions because the thicker consistency is better at preventing skin dehydration. Apply the cream or ointment  within 15 minutes of showering. Reapply moisturizer at night, and moisturize your hands every time after you wash  them.   Infection Prevention Please wash your hands for at least 30 seconds using warm soapy water. Handwashing is the #1 way to prevent the spread of germs. Stay away from sick people or people who are getting over a cold. If you develop respiratory systems such as green/yellow mucus production or productive cough or persistent cough let us know and we will see if you need an antibiotic. It is a good idea to keep a pair of gloves on when going into grocery stores/[employer] to decrease your risk of coming into contact with germs on the carts, etc. Carry alcohol hand gel with you at all times and use it frequently if out in public. If your temperature reaches 100.5 or higher please call the clinic and let us know.  If it is after hours or on the weekend please go to the ER if your temperature is over 100.4.  Please have your own personal thermometer at home to use.    Sex and bodily fluids If you are going to have sex, a condom must be used to protect the person that isn't taking immunotherapy. For a few days after treatment, immunotherapy can be excreted through your bodily fluids.  When using the toilet please close the lid and flush the toilet twice.  Do this for a few day after you have had immunotherapy.   Contraception It is not known for sure whether or not immunotherapy drugs can be passed on through semen or secretions from the vagina. Because of this some doctors advise people to use a barrier method if you have sex during treatment. This applies to vaginal, anal or oral sex.  Generally, doctors advise a barrier method only for the time you are actually having the treatment and for about a week after your treatment.  Advice like this can be worrying, but this does not mean that you have to avoid being intimate with your partner. You can still have close contact with your partner and  continue to enjoy sex.  Animals If you have cats or birds we just ask that you not change the litter or change the cage.  Please have someone else do this for you while you are on immunotherapy.   Food Safety During and After Cancer Treatment Food safety is important for people both during and after cancer treatment. Cancer and cancer treatments, such as chemotherapy, radiation therapy, and stem cell/bone marrow transplantation, often weaken the immune system. This makes it harder for your body to protect itself from foodborne illness, also called food poisoning. Foodborne illness is caused by eating food that contains harmful bacteria, parasites, or viruses.  Foods to avoid Some foods have a higher risk of becoming tainted with bacteria. These include: Unwashed fresh fruit and vegetables, especially leafy vegetables that can hide dirt and other contaminants Raw sprouts, such as alfalfa sprouts Raw or undercooked beef, especially ground beef, or other raw or undercooked meat and poultry Fatty, fried, or spicy foods immediately before or after treatment.  These can sit heavy on your stomach and make you feel nauseous. Raw or undercooked shellfish, such as oysters. Sushi and sashimi, which often contain raw fish.  Unpasteurized beverages, such as unpasteurized fruit juices, raw milk, raw yogurt, or cider Undercooked eggs, such as soft boiled, over easy, and poached; raw, unpasteurized eggs; or foods made with raw egg, such as homemade raw cookie dough and homemade mayonnaise  Simple steps for food safety  Shop smart. Do not buy food stored or displayed  in an unclean area. Do not buy bruised or damaged fruits or vegetables. Do not buy cans that have cracks, dents, or bulges. Pick up foods that can spoil at the end of your shopping trip and store them in a cooler on the way home.  Prepare and clean up foods carefully. Rinse all fresh fruits and vegetables under running water, and dry them  with a clean towel or paper towel. Clean the top of cans before opening them. After preparing food, wash your hands for 20 seconds with hot water and soap. Pay special attention to areas between fingers and under nails. Clean your utensils and dishes with hot water and soap. Disinfect your kitchen and cutting boards using 1 teaspoon of liquid, unscented bleach mixed into 1 quart of water.    Dispose of old food. Eat canned and packaged food before its expiration date (the use by or best before date). Consume refrigerated leftovers within 3 to 4 days. After that time, throw out the food. Even if the food does not smell or look spoiled, it still may be unsafe. Some bacteria, such as Listeria, can grow even on foods stored in the refrigerator if they are kept for too long.  Take precautions when eating out. At restaurants, avoid buffets and salad bars where food sits out for a long time and comes in contact with many people. Food can become contaminated when someone with a virus, often a norovirus, or another bug handles it. Put any leftover food in a to-go container yourself, rather than having the server do it. And, refrigerate leftovers as soon as you get home. Choose restaurants that are clean and that are willing to prepare your food as you order it cooked.    SYMPTOMS TO REPORT AS SOON AS POSSIBLE AFTER TREATMENT:  FEVER GREATER THAN 100.4 F CHILLS WITH OR WITHOUT FEVER NAUSEA AND VOMITING THAT IS NOT CONTROLLED WITH YOUR NAUSEA MEDICATION UNUSUAL SHORTNESS OF BREATH UNUSUAL BRUISING OR BLEEDING TENDERNESS IN MOUTH AND THROAT WITH OR WITHOUT PRESENCE OF ULCERS URINARY PROBLEMS BOWEL PROBLEMS UNUSUAL RASH     Wear comfortable clothing and clothing appropriate for easy access to any Portacath or PICC line. Let us know if there is anything that we can do to make your therapy better!   What to do if you need assistance after hours or on the weekends: CALL [phone].  HOLD  on the line, do not hang up.  You will hear multiple messages but at the end you will be connected with a nurse triage line.  They will contact the doctor if necessary.  Most of the time they will be able to assist you.  Do not call the hospital operator.    I have been informed and understand all of the instructions given to me and have received a copy. I have been instructed to call the clinic [phone] or my family physician as soon as possible for continued medical care, if indicated. I do not have any more questions at this time but understand that I may call the [facility] or the Patient Navigator at [phone] during office hours should I have questions or need assistance in obtaining follow-up care.

## 2022-01-02 ENCOUNTER — Encounter: Payer: Self-pay | Admitting: Surgery

## 2022-01-02 ENCOUNTER — Other Ambulatory Visit: Payer: Self-pay

## 2022-01-02 ENCOUNTER — Ambulatory Visit (INDEPENDENT_AMBULATORY_CARE_PROVIDER_SITE_OTHER): Payer: Medicare Other | Admitting: Surgery

## 2022-01-02 VITALS — BP 123/67 | HR 73 | Temp 98.7°F | Resp 12 | Ht 71.0 in | Wt 164.0 lb

## 2022-01-02 DIAGNOSIS — C210 Malignant neoplasm of anus, unspecified: Secondary | ICD-10-CM | POA: Diagnosis not present

## 2022-01-02 NOTE — H&P (Signed)
[facility] History and Physical   Reason for Referral: Port-A-Cath placement Referring Physician: Derek Jack, MD   Chief Complaint   New Patient (Initial Visit)        Jason Avila is a 88 y.o. male.  HPI: Patient presents to discuss Port-A-Cath placement.  He had a recent episode of rectal bleeding in mid December, and was sent over to [facility] clinic.  At that time, he underwent EGD and colonoscopy.  EGD did not demonstrate any abnormalities, but colonoscopy demonstrated a 2 cm mass with a cratered ulcer, and the distal margin was abutting the anal verge.  Biopsy demonstrated invasive squamous cell carcinoma.  He had underwent MRI of the rectum which was demonstrating a T1/T2 lesion.  He has seen oncology, who plans to perform Nigro protocol, and patient was referred to general surgery for port placement.  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  He denies ever having surgery on his vocal cords.  He also has a history of chronic kidney disease, currently on peritoneal dialysis.  He performs his peritoneal dialysis overnight.  He denies history of abdominal surgeries.  He denies use of blood thinning medications.  He also denies tobacco and alcohol use.         Past Medical History:  Diagnosis Date   Anemia     Aortic valve stenosis      mild AS 04/2017 (followed by Dr. Cleora Fleet, [place])   Bilateral hearing loss      bilateral hearing aids   Cancer [facility]) 2018    basal cell carcinoma on nose   Chronic kidney disease      ckd stage 4, bilateral cysts on kidneys [facility])   Dysrhythmia      RBBB   GERD (gastroesophageal reflux disease)     Gout      left foot   Heart murmur      no problems   History of blood transfusion 2018   History of radiation therapy 11/18/19- 12/16/19    Larynx 54 Gy in 20 fx   Hyperlipidemia      Hypertension     Sleep apnea     Subdural hematoma 11/2018   Wears glasses             Past Surgical History:  Procedure Laterality Date   BURR HOLE FOR SUBDURAL HEMATOMA   12/15/2018    brain   COLONOSCOPY       EYE SURGERY Bilateral      cataracts removed   MICROLARYNGOSCOPY N/A 10/23/2019    Procedure: Microlaryngoscopy with vocal cord lesion biopsy;  Surgeon: Jerrell Belfast, MD;  Location: [place];  Service: ENT;  Laterality: N/A;   UPPER GI ENDOSCOPY          No family history on file.   Social History         Tobacco Use   Smoking status: Former      Years: 40.00      Types: Cigarettes   Smokeless tobacco: Never   Tobacco comments:      Quit in 1998  Vaping Use   Vaping Use: Never used  Substance Use Topics   Alcohol use: Never   Drug use: Never      Medications: I have reviewed the patient's current medications. Allergies as of 01/02/2022   No Known Allergies  Medication List           Accurate as of January 02, 2022  2:58 PM. If you have any questions, ask your nurse or doctor.              amLODipine 5 MG tablet Commonly known as: NORVASC Take 5 mg by mouth daily.    atorvastatin 20 MG tablet Commonly known as: LIPITOR Take 20 mg by mouth daily at 6 PM.    Cholecalciferol 25 MCG (1000 UT) capsule Take 1 capsule by mouth daily.    DSS 100 MG Caps Take by mouth.    epoetin alfa 10000 UNIT/ML injection Commonly known as: EPOGEN Inject 1 Units into the skin every 14 (fourteen) days.    GNP Stool Softener/Laxative 8.6-50 MG tablet Generic drug: senna-docusate Take 1 tablet by mouth every other day.    hydrALAZINE 100 MG tablet Commonly known as: APRESOLINE Take 1 tablet by mouth 3 (three) times daily.    levothyroxine 25 MCG tablet Commonly known as: SYNTHROID Take 1 tablet by mouth every morning.    LiquaCel Liqd Take by mouth.    mirtazapine 15 MG tablet Commonly known as: REMERON Take 15 mg by mouth daily.     pantoprazole 40 MG tablet Commonly known as: PROTONIX Take 40 mg by mouth every morning.    Velphoro 500 MG chewable tablet Generic drug: sucroferric oxyhydroxide Chew 500 tablets by mouth daily.               ROS:  Constitutional: positive for chills and weight loss, negative for fatigue and fevers Eyes: negative for visual disturbance Ears, nose, mouth, throat, and face: negative for ear drainage, sore throat, and sinus problems Respiratory: negative for cough, wheezing, and shortness of breath Cardiovascular: negative for chest pain and palpitations Gastrointestinal: negative for abdominal pain, nausea, reflux symptoms, and vomiting Genitourinary:negative for dysuria, frequency, and hematuria Integument/breast: negative for dryness and rash Hematologic/lymphatic: negative for bleeding and lymphadenopathy Musculoskeletal:negative for back pain, bone pain, and neck pain Neurological: negative for dizziness, tremors, and weakness Endocrine: negative for temperature intolerance   Blood pressure 123/67, pulse 73, temperature 98.7 F (37.1 C), temperature source Other (Comment), resp. rate 12, height 5\' 11"  (1.803 m), weight 164 lb (74.4 kg), SpO2 97 %. Physical Exam Vitals reviewed.  Constitutional:      Appearance: Normal appearance.  HENT:     Head: Normocephalic and atraumatic.  Eyes:     Extraocular Movements: Extraocular movements intact.     Pupils: Pupils are equal, round, and reactive to light.  Cardiovascular:     Rate and Rhythm: Normal rate and regular rhythm.  Pulmonary:     Effort: Pulmonary effort is normal.     Breath sounds: Normal breath sounds.  Abdominal:     General: Abdomen is flat. There is no distension.     Palpations: Abdomen is soft.     Tenderness: There is no abdominal tenderness. There is no guarding or rebound.  Musculoskeletal:        General: Normal range of motion.     Cervical back: Normal range of motion.  Skin:    General: Skin is  warm and dry.  Neurological:     General: No focal deficit present.     Mental Status: He is alert and oriented to person, place, and time.  Psychiatric:        Mood and Affect: Mood normal.        Behavior: Behavior normal.  Results: Lab Results Last 48 Hours  No results found for this or any previous visit (from the past 48 hour(s)).     Imaging Results (Last 48 hours)  No results found.       Assessment & Plan:  Jason Avila is a 88 y.o. male who presents for Port-A-Cath placement, secondary to recent diagnosis of anal cancer.   -Patient scheduled for Port-A-Cath placement on 01/15/2022 -Discussed with patient plan for right IJ location of catheter, given increased risk for subclavian stenosis after catheter placement.  If he does inevitably need hemodialysis, I do not want to burn any bridges for possible fistula locations -Discussed the risk and benefits of right IJ Port-A-Cath placement, including but not limited to bleeding, infection, pneumothorax requiring chest tube placement, and injury to surrounding structures.  After careful consideration, Jason Avila is agreeable to the procedure.   All questions were answered to the satisfaction of the patient and family.     Graciella Freer, DO [facility] [address] [place], [place] [address] [phone] (office)

## 2022-01-02 NOTE — Progress Notes (Signed)
[facility] History and Physical  Reason for Referral: Port-A-Cath placement Referring Physician: Derek Jack, MD  Chief Complaint   New Patient (Initial Visit)     Jason Avila is a 88 y.o. male.  HPI: Patient presents to discuss Port-A-Cath placement.  He had a recent episode of rectal bleeding in mid December, and was sent over to [facility] clinic.  At that time, he underwent EGD and colonoscopy.  EGD did not demonstrate any abnormalities, but colonoscopy demonstrated a 2 cm mass with a cratered ulcer, and the distal margin was abutting the anal verge.  Biopsy demonstrated invasive squamous cell carcinoma.  He had underwent MRI of the rectum which was demonstrating a T1/T2 lesion.  He has seen oncology, who plans to perform Nigro protocol, and patient was referred to general surgery for port placement.  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  He denies ever having surgery on his vocal cords.  He also has a history of chronic kidney disease, currently on peritoneal dialysis.  He performs his peritoneal dialysis overnight.  He denies history of abdominal surgeries.  He denies use of blood thinning medications.  He also denies tobacco and alcohol use.   Past Medical History:  Diagnosis Date   Anemia    Aortic valve stenosis    mild AS 04/2017 (followed by Dr. Cleora Fleet, [place])   Bilateral hearing loss    bilateral hearing aids   Cancer [facility]) 2018   basal cell carcinoma on nose   Chronic kidney disease    ckd stage 4, bilateral cysts on kidneys [facility])   Dysrhythmia    RBBB   GERD (gastroesophageal reflux disease)    Gout    left foot   Heart murmur    no problems   History of blood transfusion 2018   History of radiation therapy 11/18/19- 12/16/19   Larynx 54 Gy in 20 fx   Hyperlipidemia    Hypertension    Sleep  apnea    Subdural hematoma 11/2018   Wears glasses     Past Surgical History:  Procedure Laterality Date   BURR HOLE FOR SUBDURAL HEMATOMA  12/15/2018   brain   COLONOSCOPY     EYE SURGERY Bilateral    cataracts removed   MICROLARYNGOSCOPY N/A 10/23/2019   Procedure: Microlaryngoscopy with vocal cord lesion biopsy;  Surgeon: Jerrell Belfast, MD;  Location: [place];  Service: ENT;  Laterality: N/A;   UPPER GI ENDOSCOPY      No family history on file.  Social History   Tobacco Use   Smoking status: Former    Years: 40.00    Types: Cigarettes   Smokeless tobacco: Never   Tobacco comments:    Quit in 1998  Vaping Use   Vaping Use: Never used  Substance Use Topics   Alcohol use: Never   Drug use: Never    Medications: I have reviewed the patient's current medications. Allergies as of 01/02/2022   No Known Allergies      Medication List        Accurate as of January 02, 2022  2:58 PM. If you have any questions, ask your nurse or doctor.          amLODipine 5 MG tablet Commonly known as: NORVASC Take 5 mg by mouth daily.   atorvastatin 20 MG tablet Commonly known as: LIPITOR Take 20 mg  by mouth daily at 6 PM.   Cholecalciferol 25 MCG (1000 UT) capsule Take 1 capsule by mouth daily.   DSS 100 MG Caps Take by mouth.   epoetin alfa 10000 UNIT/ML injection Commonly known as: EPOGEN Inject 1 Units into the skin every 14 (fourteen) days.   GNP Stool Softener/Laxative 8.6-50 MG tablet Generic drug: senna-docusate Take 1 tablet by mouth every other day.   hydrALAZINE 100 MG tablet Commonly known as: APRESOLINE Take 1 tablet by mouth 3 (three) times daily.   levothyroxine 25 MCG tablet Commonly known as: SYNTHROID Take 1 tablet by mouth every morning.   LiquaCel Liqd Take by mouth.   mirtazapine 15 MG tablet Commonly known as: REMERON Take 15 mg by mouth daily.   pantoprazole 40 MG tablet Commonly known as: PROTONIX Take 40 mg by mouth every  morning.   Velphoro 500 MG chewable tablet Generic drug: sucroferric oxyhydroxide Chew 500 tablets by mouth daily.         ROS:  Constitutional: positive for chills and weight loss, negative for fatigue and fevers Eyes: negative for visual disturbance Ears, nose, mouth, throat, and face: negative for ear drainage, sore throat, and sinus problems Respiratory: negative for cough, wheezing, and shortness of breath Cardiovascular: negative for chest pain and palpitations Gastrointestinal: negative for abdominal pain, nausea, reflux symptoms, and vomiting Genitourinary:negative for dysuria, frequency, and hematuria Integument/breast: negative for dryness and rash Hematologic/lymphatic: negative for bleeding and lymphadenopathy Musculoskeletal:negative for back pain, bone pain, and neck pain Neurological: negative for dizziness, tremors, and weakness Endocrine: negative for temperature intolerance  Blood pressure 123/67, pulse 73, temperature 98.7 F (37.1 C), temperature source Other (Comment), resp. rate 12, height 5\' 11"  (1.803 m), weight 164 lb (74.4 kg), SpO2 97 %. Physical Exam Vitals reviewed.  Constitutional:      Appearance: Normal appearance.  HENT:     Head: Normocephalic and atraumatic.  Eyes:     Extraocular Movements: Extraocular movements intact.     Pupils: Pupils are equal, round, and reactive to light.  Cardiovascular:     Rate and Rhythm: Normal rate and regular rhythm.  Pulmonary:     Effort: Pulmonary effort is normal.     Breath sounds: Normal breath sounds.  Abdominal:     General: Abdomen is flat. There is no distension.     Palpations: Abdomen is soft.     Tenderness: There is no abdominal tenderness. There is no guarding or rebound.  Musculoskeletal:        General: Normal range of motion.     Cervical back: Normal range of motion.  Skin:    General: Skin is warm and dry.  Neurological:     General: No focal deficit present.     Mental Status:  He is alert and oriented to person, place, and time.  Psychiatric:        Mood and Affect: Mood normal.        Behavior: Behavior normal.    Results: No results found for this or any previous visit (from the past 48 hour(s)).  No results found.   Assessment & Plan:  Jason Avila is a 88 y.o. male who presents for Port-A-Cath placement, secondary to recent diagnosis of anal cancer.  -Patient scheduled for Port-A-Cath placement on 01/15/2022 -Discussed with patient plan for right IJ location of catheter, given increased risk for subclavian stenosis after catheter placement.  If he does inevitably need hemodialysis, I do not want to burn any bridges for possible fistula  locations -Discussed the risk and benefits of right IJ Port-A-Cath placement, including but not limited to bleeding, infection, pneumothorax requiring chest tube placement, and injury to surrounding structures.  After careful consideration, Jason Avila is agreeable to the procedure.  All questions were answered to the satisfaction of the patient and family.   Graciella Freer, DO [facility] [address] [place], [place] [address] [phone] (office)

## 2022-01-04 ENCOUNTER — Encounter (HOSPITAL_COMMUNITY): Payer: Self-pay | Admitting: Hematology

## 2022-01-05 ENCOUNTER — Encounter (HOSPITAL_COMMUNITY): Payer: Self-pay | Admitting: Hematology

## 2022-01-05 ENCOUNTER — Encounter (HOSPITAL_COMMUNITY): Payer: Self-pay

## 2022-01-05 DIAGNOSIS — Z95828 Presence of other vascular implants and grafts: Secondary | ICD-10-CM | POA: Insufficient documentation

## 2022-01-05 HISTORY — DX: Presence of other vascular implants and grafts: Z95.828

## 2022-01-05 MED ORDER — PROCHLORPERAZINE MALEATE 10 MG PO TABS: 10.0000 mg | ORAL_TABLET | Freq: Four times a day (QID) | ORAL | 1 refills | Status: DC | PRN

## 2022-01-05 MED ORDER — LIDOCAINE-PRILOCAINE 2.5-2.5 % EX CREA: TOPICAL_CREAM | CUTANEOUS | 3 refills | Status: DC

## 2022-01-08 ENCOUNTER — Encounter (HOSPITAL_COMMUNITY): Payer: Self-pay | Admitting: Emergency Medicine

## 2022-01-08 ENCOUNTER — Inpatient Hospital Stay (HOSPITAL_COMMUNITY): Payer: Medicare Other

## 2022-01-08 ENCOUNTER — Other Ambulatory Visit: Payer: Self-pay

## 2022-01-08 DIAGNOSIS — C210 Malignant neoplasm of anus, unspecified: Secondary | ICD-10-CM

## 2022-01-08 DIAGNOSIS — Z95828 Presence of other vascular implants and grafts: Secondary | ICD-10-CM

## 2022-01-08 NOTE — Progress Notes (Signed)
[employer] approved lidocaine 2.5% from 01/05/22-04/05/22 for a 30 grams for a 30 day supply.

## 2022-01-08 NOTE — Progress Notes (Signed)

## 2022-01-10 ENCOUNTER — Encounter (HOSPITAL_COMMUNITY)
Admission: RE | Admit: 2022-01-10 | Discharge: 2022-01-10 | Disposition: A | Discharge status: Home / Self Care | Payer: Medicare Other | Source: Ambulatory Visit | Attending: Surgery | Admitting: Surgery

## 2022-01-10 NOTE — Pre-Procedure Instructions (Signed)
Left message for patient to call back for pre-op phone call.

## 2022-01-11 ENCOUNTER — Encounter (HOSPITAL_COMMUNITY): Payer: Self-pay

## 2022-01-11 ENCOUNTER — Other Ambulatory Visit: Payer: Self-pay

## 2022-01-11 NOTE — Pre-Procedure Instructions (Signed)
Spoke with wife Deloris because patient is very HOH. He gave permission on call. She states patient does peritoneal dialysis at home every night and that he does not come off dialysis until 0630 am. She states they cannot be here by even the 0730 procedure time as they are form [place]. I referred them to Dr Okey Dupre to see what they need to do.

## 2022-01-15 ENCOUNTER — Ambulatory Visit (HOSPITAL_COMMUNITY): Payer: Medicare Other

## 2022-01-15 ENCOUNTER — Encounter (HOSPITAL_COMMUNITY): Payer: Self-pay | Admitting: Surgery

## 2022-01-15 ENCOUNTER — Ambulatory Visit (HOSPITAL_COMMUNITY): Payer: Medicare Other | Admitting: Anesthesiology

## 2022-01-15 ENCOUNTER — Encounter (HOSPITAL_COMMUNITY)
Admission: RE | Disposition: A | Discharge status: Home / Self Care | Payer: Self-pay | Source: Home / Self Care | Attending: Surgery

## 2022-01-15 ENCOUNTER — Ambulatory Visit (HOSPITAL_COMMUNITY)
Admission: RE | Admit: 2022-01-15 | Discharge: 2022-01-15 | Disposition: A | Discharge status: Home / Self Care | Payer: Medicare Other | Attending: Surgery | Admitting: Surgery

## 2022-01-15 ENCOUNTER — Other Ambulatory Visit: Payer: Self-pay

## 2022-01-15 DIAGNOSIS — C210 Malignant neoplasm of anus, unspecified: Secondary | ICD-10-CM | POA: Insufficient documentation

## 2022-01-15 DIAGNOSIS — I120 Hypertensive chronic kidney disease with stage 5 chronic kidney disease or end stage renal disease: Secondary | ICD-10-CM | POA: Insufficient documentation

## 2022-01-15 DIAGNOSIS — Z923 Personal history of irradiation: Secondary | ICD-10-CM | POA: Diagnosis not present

## 2022-01-15 DIAGNOSIS — Z79899 Other long term (current) drug therapy: Secondary | ICD-10-CM | POA: Insufficient documentation

## 2022-01-15 DIAGNOSIS — N184 Chronic kidney disease, stage 4 (severe): Secondary | ICD-10-CM | POA: Insufficient documentation

## 2022-01-15 DIAGNOSIS — E039 Hypothyroidism, unspecified: Secondary | ICD-10-CM | POA: Insufficient documentation

## 2022-01-15 DIAGNOSIS — Z87891 Personal history of nicotine dependence: Secondary | ICD-10-CM | POA: Insufficient documentation

## 2022-01-15 DIAGNOSIS — Z992 Dependence on renal dialysis: Secondary | ICD-10-CM | POA: Diagnosis not present

## 2022-01-15 DIAGNOSIS — Z8521 Personal history of malignant neoplasm of larynx: Secondary | ICD-10-CM | POA: Diagnosis not present

## 2022-01-15 DIAGNOSIS — D631 Anemia in chronic kidney disease: Secondary | ICD-10-CM | POA: Insufficient documentation

## 2022-01-15 DIAGNOSIS — K219 Gastro-esophageal reflux disease without esophagitis: Secondary | ICD-10-CM | POA: Diagnosis not present

## 2022-01-15 DIAGNOSIS — Z95828 Presence of other vascular implants and grafts: Secondary | ICD-10-CM

## 2022-01-15 HISTORY — PX: PORTACATH PLACEMENT: SHX2246

## 2022-01-15 LAB — POCT I-STAT, CHEM 8
BUN: 57 mg/dL — ABNORMAL HIGH (ref 8–23)
Calcium, Ion: 0.97 mmol/L — ABNORMAL LOW (ref 1.15–1.40)
Chloride: 105 mmol/L (ref 98–111)
Creatinine, Ser: 7.5 mg/dL — ABNORMAL HIGH (ref 0.61–1.24)
Glucose, Bld: 94 mg/dL (ref 70–99)
HCT: 40 % (ref 39.0–52.0)
Hemoglobin: 13.6 g/dL (ref 13.0–17.0)
Potassium: 3.9 mmol/L (ref 3.5–5.1)
Sodium: 141 mmol/L (ref 135–145)
TCO2: 27 mmol/L (ref 22–32)

## 2022-01-15 IMAGING — DX DG CHEST 1V PORT
1 series · 1 of 1 positions shown · non-contrast
Comparison: None.

CLINICAL DATA: Placement of right IJ chest port

EXAM:
PORTABLE CHEST 1 VIEW

[chest ap]
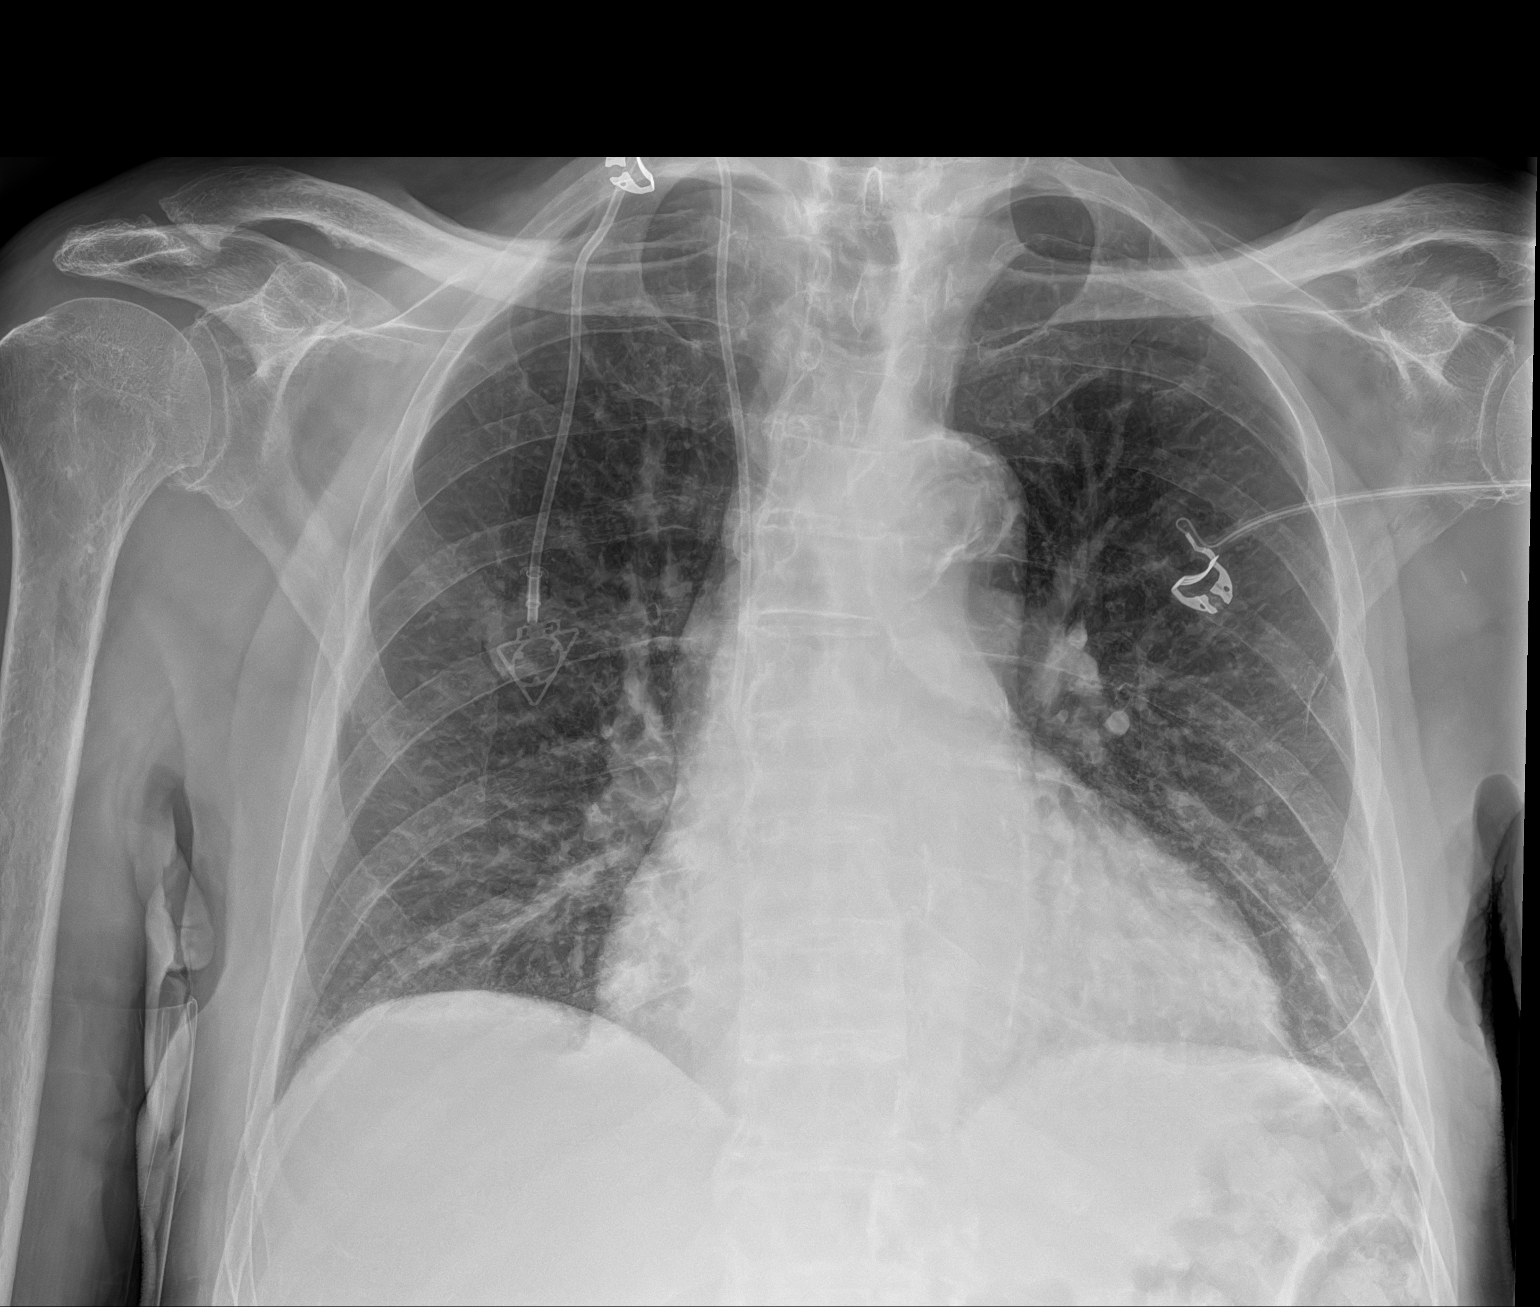

[1 of 1 positions shown; findings below may reference images not displayed]

FINDINGS: Transverse diameter of heart is increased. There are no signs of
pulmonary edema or focal pulmonary consolidation. There is no
pleural effusion or pneumothorax. Tip of right IJ central venous
catheter is seen in the superior vena cava.
IMPRESSION: Cardiomegaly. There are no signs of pulmonary edema or focal
pulmonary consolidation. Tip of right IJ chest port is seen in the
superior vena cava. There is no pneumothorax.

## 2022-01-15 SURGERY — INSERTION, TUNNELED CENTRAL VENOUS DEVICE, WITH PORT
Anesthesia: General | Site: Chest | Laterality: Right

## 2022-01-15 MED ORDER — LIDOCAINE HCL (PF) 1 % IJ SOLN
INTRAMUSCULAR | Status: DC | PRN
  Administered 2022-01-15: 14 mL

## 2022-01-15 MED ORDER — CHLORHEXIDINE GLUCONATE CLOTH 2 % EX PADS: 6.0000 | MEDICATED_PAD | Freq: Once | CUTANEOUS | Status: DC

## 2022-01-15 MED ORDER — CEFAZOLIN SODIUM-DEXTROSE 2-4 GM/100ML-% IV SOLN
2.0000 g | INTRAVENOUS | Status: AC
  Administered 2022-01-15: 2 g via INTRAVENOUS

## 2022-01-15 MED ORDER — CHLORHEXIDINE GLUCONATE 0.12 % MT SOLN
OROMUCOSAL | Status: AC
  Administered 2022-01-15: 15 mL via OROMUCOSAL
  Filled 2022-01-15: qty 15

## 2022-01-15 MED ORDER — HEPARIN SOD (PORK) LOCK FLUSH 100 UNIT/ML IV SOLN
INTRAVENOUS | Status: AC
  Filled 2022-01-15: qty 5

## 2022-01-15 MED ORDER — OXYCODONE HCL 5 MG PO TABS
5.0000 mg | ORAL_TABLET | Freq: Three times a day (TID) | ORAL | 0 refills | Status: DC | PRN
Start: 2022-01-15 — End: 2022-01-15

## 2022-01-15 MED ORDER — PROPOFOL 500 MG/50ML IV EMUL
INTRAVENOUS | Status: DC | PRN
  Administered 2022-01-15: 50 ug/kg/min via INTRAVENOUS

## 2022-01-15 MED ORDER — HEPARIN SODIUM (PORCINE) 5000 UNIT/ML IJ SOLN
INTRAMUSCULAR | Status: AC
  Filled 2022-01-15: qty 1

## 2022-01-15 MED ORDER — OXYCODONE HCL 5 MG PO TABS
5.0000 mg | ORAL_TABLET | Freq: Three times a day (TID) | ORAL | 0 refills | Status: DC | PRN
Start: 2022-01-15 — End: 2022-04-17

## 2022-01-15 MED ORDER — HEPARIN SOD (PORK) LOCK FLUSH 100 UNIT/ML IV SOLN
INTRAVENOUS | Status: DC | PRN
  Administered 2022-01-15: 100 [IU] via INTRAVENOUS

## 2022-01-15 MED ORDER — FENTANYL CITRATE PF 50 MCG/ML IJ SOSY: 25.0000 ug | PREFILLED_SYRINGE | INTRAMUSCULAR | Status: DC | PRN

## 2022-01-15 MED ORDER — LIDOCAINE HCL (PF) 1 % IJ SOLN
INTRAMUSCULAR | Status: AC
  Filled 2022-01-15: qty 30

## 2022-01-15 MED ORDER — PROPOFOL 10 MG/ML IV BOLUS
INTRAVENOUS | Status: AC
  Filled 2022-01-15: qty 20

## 2022-01-15 MED ORDER — ACETAMINOPHEN 500 MG PO TABS: 1000.0000 mg | ORAL_TABLET | Freq: Four times a day (QID) | ORAL | 0 refills | Status: AC

## 2022-01-15 MED ORDER — SODIUM CHLORIDE 0.9 % IV SOLN: INTRAVENOUS | Status: DC

## 2022-01-15 MED ORDER — SODIUM CHLORIDE (PF) 0.9 % IJ SOLN
INTRAMUSCULAR | Status: DC | PRN
  Administered 2022-01-15: 500 mL via INTRAVENOUS

## 2022-01-15 MED ORDER — DOCUSATE SODIUM 100 MG PO CAPS: 100.0000 mg | ORAL_CAPSULE | Freq: Two times a day (BID) | ORAL | 2 refills | Status: DC

## 2022-01-15 MED ORDER — PROPOFOL 10 MG/ML IV BOLUS
INTRAVENOUS | Status: DC | PRN
Start: 2022-01-15 — End: 2022-01-15
  Administered 2022-01-15: 20 mg via INTRAVENOUS
  Administered 2022-01-15: 30 mg via INTRAVENOUS

## 2022-01-15 MED ORDER — CEFAZOLIN SODIUM-DEXTROSE 2-4 GM/100ML-% IV SOLN
INTRAVENOUS | Status: AC
  Filled 2022-01-15: qty 100

## 2022-01-15 MED ORDER — ORAL CARE MOUTH RINSE: 15.0000 mL | Freq: Once | OROMUCOSAL | Status: AC

## 2022-01-15 MED ORDER — ONDANSETRON HCL 4 MG/2ML IJ SOLN: 4.0000 mg | Freq: Once | INTRAMUSCULAR | Status: DC | PRN

## 2022-01-15 MED ORDER — CHLORHEXIDINE GLUCONATE 0.12 % MT SOLN: 15.0000 mL | Freq: Once | OROMUCOSAL | Status: AC

## 2022-01-15 MED ORDER — HEPARIN SODIUM (PORCINE) 5000 UNIT/ML IJ SOLN
5000.0000 [IU] | Freq: Once | INTRAMUSCULAR | Status: AC
  Administered 2022-01-15: 5000 [IU] via SUBCUTANEOUS

## 2022-01-15 SURGICAL SUPPLY — 30 items
ADH SKN CLS APL DERMABOND .7 (GAUZE/BANDAGES/DRESSINGS) ×1
APL PRP STRL LF ISPRP CHG 10.5 (MISCELLANEOUS) ×2
APPLICATOR CHLORAPREP 10.5 ORG (MISCELLANEOUS) ×3 IMPLANT
BAG DECANTER FOR FLEXI CONT (MISCELLANEOUS) ×2 IMPLANT
BLADE 11 SAFETY STRL DISP (BLADE) ×1 IMPLANT
CLOTH BEACON ORANGE TIMEOUT ST (SAFETY) ×2 IMPLANT
COVER LIGHT HANDLE STERIS (MISCELLANEOUS) ×4 IMPLANT
DERMABOND ADVANCED (GAUZE/BANDAGES/DRESSINGS) ×1
DERMABOND ADVANCED .7 DNX12 (GAUZE/BANDAGES/DRESSINGS) ×1 IMPLANT
DRAPE C-ARM FOLDED MOBILE STRL (DRAPES) ×2 IMPLANT
ELECT REM PT RETURN 9FT ADLT (ELECTROSURGICAL) ×2
ELECTRODE REM PT RTRN 9FT ADLT (ELECTROSURGICAL) ×1 IMPLANT
GLOVE SURG ENC MOIS LTX SZ6.5 (GLOVE) ×2 IMPLANT
GLOVE SURG UNDER POLY LF SZ7 (GLOVE) ×4 IMPLANT
GOWN STRL REUS W/TWL LRG LVL3 (GOWN DISPOSABLE) ×4 IMPLANT
IV NS 500ML (IV SOLUTION) ×2
IV NS 500ML BAXH (IV SOLUTION) ×1 IMPLANT
KIT PORT POWER 8FR ISP MRI (Port) ×2 IMPLANT
KIT TURNOVER KIT A (KITS) ×2 IMPLANT
MANIFOLD NEPTUNE II (INSTRUMENTS) ×2 IMPLANT
NDL HYPO 25X1 1.5 SAFETY (NEEDLE) ×1 IMPLANT
NEEDLE HYPO 25X1 1.5 SAFETY (NEEDLE) ×2 IMPLANT
PACK MINOR (CUSTOM PROCEDURE TRAY) ×2 IMPLANT
PAD ARMBOARD 7.5X6 YLW CONV (MISCELLANEOUS) ×2 IMPLANT
SET BASIN LINEN APH (SET/KITS/TRAYS/PACK) ×2 IMPLANT
SUT MNCRL AB 4-0 PS2 18 (SUTURE) ×2 IMPLANT
SUT VIC AB 3-0 SH 27 (SUTURE) ×2
SUT VIC AB 3-0 SH 27X BRD (SUTURE) ×1 IMPLANT
SYR 10ML LL (SYRINGE) ×4 IMPLANT
SYR CONTROL 10ML LL (SYRINGE) ×2 IMPLANT

## 2022-01-15 NOTE — Transfer of Care (Signed)
Immediate Anesthesia Transfer of Care Note  Patient: Jason Avila  Procedure(s) Performed: INSERTION PORT-A-CATH RIJ (Right: Chest)  Patient Location: PACU  Anesthesia Type:MAC  Level of Consciousness: awake, alert  and oriented  Airway & Oxygen Therapy: Patient Spontanous Breathing  Post-op Assessment: Report given to RN and Post -op Vital signs reviewed and stable  Post vital signs: stable  Last Vitals:  Vitals Value Taken Time  BP 144/84 01/15/22 1432  Temp 37 C 01/15/22 1432  Pulse 65 01/15/22 1433  Resp 14 01/15/22 1433  SpO2 97 % 01/15/22 1433  Vitals shown include unvalidated device data.  Last Pain:  Vitals:   01/15/22 1432  TempSrc: Axillary  PainSc:       Patients Stated Pain Goal: 4 (47/09/29 5747)  Complications: No notable events documented.

## 2022-01-15 NOTE — Interval H&P Note (Signed)
History and Physical Interval Note:  01/15/2022 1:26 PM  Jason Avila  has presented today for surgery, with the diagnosis of ANAL CANCER.  The various methods of treatment have been discussed with the patient and family. After consideration of risks, benefits and other options for treatment, the patient has consented to  Procedure(s) with comments: INSERTION PORT-A-CATH RIJ (Right) - pt can't come earlier d/t peritoneal dialysis as a surgical intervention.  The patient's history has been reviewed, patient examined, no change in status, stable for surgery.  I have reviewed the patient's chart and labs.  Questions were answered to the patient's satisfaction.     [place]

## 2022-01-15 NOTE — Anesthesia Postprocedure Evaluation (Signed)
Anesthesia Post Note  Patient: Niraj Steege  Procedure(s) Performed: INSERTION PORT-A-CATH RIJ (Right: Chest)  Patient location during evaluation: Phase II Anesthesia Type: General Level of consciousness: awake and alert and oriented Pain management: pain level controlled Vital Signs Assessment: post-procedure vital signs reviewed and stable Respiratory status: spontaneous breathing, nonlabored ventilation and respiratory function stable Cardiovascular status: blood pressure returned to baseline and stable Postop Assessment: no apparent nausea or vomiting Anesthetic complications: no   No notable events documented.   Last Vitals:  Vitals:   01/15/22 1430 01/15/22 1432  BP: (!) 144/77 (!) 144/84  Pulse: 66 84  Resp: 14 16  Temp: (!) 36.2 C 37 C  SpO2: 98% 100%    Last Pain:  Vitals:   01/15/22 1432  TempSrc: Axillary  PainSc:                  Jerni Selmer C Pietro Bonura

## 2022-01-15 NOTE — Discharge Instructions (Signed)
Ambulatory Surgery Discharge Instructions  General Anesthesia or Sedation Do not drive or operate heavy machinery for 24 hours.  Do not consume alcohol, tranquilizers, sleeping medications, or any non-prescribed medications for 24 hours. Do not make important decisions or sign any important papers in the next 24 hours. You should have someone with you tonight at home.  Activity  You are advised to go directly home from the hospital.  Restrict your activities and rest for a day.  Resume light activity tomorrow. No heavy lifting over 10 lbs or strenuous exercise.  Fluids and Diet Begin with clear liquids, bouillon, dry toast, soda crackers.  If not nauseated, you may go to a regular diet when you desire.  Greasy and spicy foods are not advised.  Medications  If you have not had a bowel movement in 24 hours, take 2 tablespoons over the counter Milk of mag.             You May resume your blood thinners tomorrow (Aspirin, coumadin, or other).  You are being discharged with prescriptions for Opioid/Narcotic Medications: There are some specific considerations for these medications that you should know. Opioid Meds have risks & benefits. Addiction to these meds is always a concern with prolonged use Take medication only as directed Do not drive while taking narcotic pain medication Do not crush tablets or capsules Do not use a different container than medication was dispensed in Lock the container of medication in a cool, dry place out of reach of children and pets. Opioid medication can cause addiction Do not share with anyone else (this is a felony) Do not store medications for future use. Dispose of them properly.     Disposal:  Find a [place] household drug take back site near you.  If you can't get to a drug take back site, use the recipe below as a last resort to dispose of expired, unused or unwanted drugs. Disposal  (Do not dispose chemotherapy drugs this way, talk to your  prescribing doctor instead.) Step 1: Mix drugs (do not crush) with dirt, kitty litter, or used coffee grounds and add a small amount of water to dissolve any solid medications. Step 2: Seal drugs in plastic bag. Step 3: Place plastic bag in trash. Step 4: Take prescription container and scratch out personal information, then recycle or throw away.  Operative Site  You have a liquid bandage over your incisions, this will begin to flake off in about a week. Ok to shower tomorrow. Keep wound clean and dry. No baths or swimming. No lifting more than 10 pounds.  Contact Information: If you have questions or concerns, please call our office, [phone], Monday- Thursday 8AM-5PM and Friday 8AM-12Noon.  If it is after hours or on the weekend, please call [facility] Main Number, [phone], and ask to speak to the surgeon on call for Dr. Kassidee Narciso at [place].   SPECIFIC COMPLICATIONS TO WATCH FOR: Inability to urinate Fever over 101? F by mouth Nausea and vomiting lasting longer than 24 hours. Pain not relieved by medication ordered Swelling around the operative site Increased redness, warmth, hardness, around operative area Numbness, tingling, or cold fingers or toes Blood -soaked dressing, (small amounts of oozing may be normal) Increasing and progressive drainage from surgical area or exam site  

## 2022-01-15 NOTE — Op Note (Signed)
Operative Note 01/15/22   Preoperative Diagnosis: Anal Cancer   Postoperative Diagnosis: Same   Procedure(s) Performed: Right IJ Port-A-Cath placement   Surgeon: Graciella Freer, DO    Assistants: None   Anesthesia: Monitored anesthesia care   Anesthesiologist: Dr. Charna Elizabeth   Specimens: None   Estimated Blood Loss: Minimal   Fluoroscopy time: 45 seconds   Blood Replacement: None    Complications: None    Operative Findings: Appropriately positioned right IJ port-a-cath  Indications: Patient is an 88 year old male who was recently diagnosed with anal cancer.  He requires port-a-cath placement for chemotherapy treatments.  All risks, benefits, and alternatives to Port-a-cath insertion were discussed with the patient and his family. All of their questions were answered to their expressed satisfaction. The patient expresses he wishes to proceed, and informed consent was obtained.  Procedure: The patient was brought into the operating room and anesthesia was induced.  The right chest and neck was prepped and draped in the usual sterile fashion.  Preoperative antibiotics were given.  One percent lidocaine was used for local anesthesia.   Using ultrasound guidance, the skin was localized.  Again using ultrasound, the access needle was advanced into the right internal jugular vein using the Seldinger technique without difficulty. A guidewire was then advanced into the right atrium under fluoroscopic guidance.  Ectopia was not noted. The guidewire was also noted to be in the IJ on ultrasound.  An incision was made below the right clavicle.  A subcutaneous pocket was formed. The skin was knicked.  The catheter was then tunneled from the pocket to the access site in the neck.  An introducer and peel-away sheath were placed over the guidewire. The catheter was then inserted through the peel-away sheath and the peel-away sheath was removed.  A spot film was performed to confirm the position.  The catheter was then attached to the port and the port placed in subcutaneous pocket. Adequate positioning was confirmed by fluoroscopy. Hemostasis was confirmed.  Good backflow of blood was noted on aspiration of the port. The port was flushed with heparin flush. Subcutaneous layer was reapproximated using a 3-0 Vicryl interrupted suture. The skin was closed using a 4-0 Vicryl subcuticular suture. Dermabond was applied.    All tape and needle counts were correct at the end of the procedure. The patient was transferred to PACU in stable condition. A chest x-ray will be performed at that time.  Graciella Freer, DO  [facility] [address] [place], [place] [address] [phone] (office)

## 2022-01-15 NOTE — Anesthesia Preprocedure Evaluation (Signed)
Anesthesia Evaluation  Patient identified by MRN, date of birth, ID band Patient awake    Reviewed: Allergy & Precautions, NPO status , Patient's Chart, lab work & pertinent test results  Airway Mallampati: II  TM Distance: >3 FB Neck ROM: Full   Comment: Carcinoma glottis  Dental  (+) Dental Advisory Given   Pulmonary former smoker,    Pulmonary exam normal breath sounds clear to auscultation       Cardiovascular Exercise Tolerance: Good hypertension, Pt. on medications + dysrhythmias + Valvular Problems/Murmurs AS  Rhythm:Regular Rate:Normal + Systolic murmurs    Neuro/Psych    GI/Hepatic GERD  Medicated and Controlled,  Endo/Other  Hypothyroidism   Renal/GU Renal Insufficiency and DialysisRenal disease (dialysis - 01/14/22, K - 3.9)     Musculoskeletal   Abdominal   Peds  Hematology  (+) anemia ,   Anesthesia Other Findings Carcinoma glottis   Reproductive/Obstetrics                            Anesthesia Physical Anesthesia Plan  ASA: 3  Anesthesia Plan: General   Post-op Pain Management: Minimal or no pain anticipated   Induction: Intravenous  PONV Risk Score and Plan: TIVA  Airway Management Planned: Nasal Cannula, Natural Airway and Simple Face Mask  Additional Equipment:   Intra-op Plan:   Post-operative Plan:   Informed Consent: I have reviewed the patients History and Physical, chart, labs and discussed the procedure including the risks, benefits and alternatives for the proposed anesthesia with the patient or authorized representative who has indicated his/her understanding and acceptance.     Dental advisory given  Plan Discussed with: CRNA and Surgeon  Anesthesia Plan Comments: (Possible GA with airway was discussed.)        Anesthesia Quick Evaluation

## 2022-01-16 ENCOUNTER — Encounter (HOSPITAL_COMMUNITY): Payer: Self-pay | Admitting: Surgery

## 2022-01-24 NOTE — Progress Notes (Signed)
Pharmacist Chemotherapy Monitoring - Initial Assessment    Anticipated start date: 01/29/22    The following has been reviewed per standard work regarding the patient's treatment regimen: The patient's diagnosis, treatment plan and drug doses, and organ/hematologic function Lab orders and baseline tests specific to treatment regimen  The treatment plan start date, drug sequencing, and pre-medications Prior authorization status  Patient's documented medication list, including drug-drug interaction screen and prescriptions for anti-emetics and supportive care specific to the treatment regimen The drug concentrations, fluid compatibility, administration routes, and timing of the medications to be used The patient's access for treatment and lifetime cumulative dose history, if applicable  The patient's medication allergies and previous infusion related reactions, if applicable   Changes made to treatment plan:  treatment plan date  Follow up needed:  N/A   Judge Stall, RPH, 01/24/2022  3:15 PM

## 2022-01-29 ENCOUNTER — Other Ambulatory Visit: Payer: Self-pay

## 2022-01-29 ENCOUNTER — Inpatient Hospital Stay (HOSPITAL_COMMUNITY): Payer: Medicare Other

## 2022-01-29 ENCOUNTER — Inpatient Hospital Stay (HOSPITAL_COMMUNITY): Payer: Medicare Other | Attending: Hematology | Admitting: Hematology

## 2022-01-29 VITALS — BP 142/81 | HR 79 | Temp 97.7°F | Resp 18 | Ht 71.0 in | Wt 163.5 lb

## 2022-01-29 DIAGNOSIS — R634 Abnormal weight loss: Secondary | ICD-10-CM | POA: Diagnosis not present

## 2022-01-29 DIAGNOSIS — E538 Deficiency of other specified B group vitamins: Secondary | ICD-10-CM | POA: Insufficient documentation

## 2022-01-29 DIAGNOSIS — Z992 Dependence on renal dialysis: Secondary | ICD-10-CM | POA: Diagnosis not present

## 2022-01-29 DIAGNOSIS — Z8521 Personal history of malignant neoplasm of larynx: Secondary | ICD-10-CM | POA: Diagnosis not present

## 2022-01-29 DIAGNOSIS — D649 Anemia, unspecified: Secondary | ICD-10-CM | POA: Diagnosis not present

## 2022-01-29 DIAGNOSIS — C210 Malignant neoplasm of anus, unspecified: Secondary | ICD-10-CM

## 2022-01-29 DIAGNOSIS — C211 Malignant neoplasm of anal canal: Secondary | ICD-10-CM | POA: Insufficient documentation

## 2022-01-29 DIAGNOSIS — N186 End stage renal disease: Secondary | ICD-10-CM | POA: Diagnosis not present

## 2022-01-29 DIAGNOSIS — D518 Other vitamin B12 deficiency anemias: Secondary | ICD-10-CM

## 2022-01-29 DIAGNOSIS — Z5111 Encounter for antineoplastic chemotherapy: Secondary | ICD-10-CM | POA: Insufficient documentation

## 2022-01-29 DIAGNOSIS — Z87891 Personal history of nicotine dependence: Secondary | ICD-10-CM | POA: Diagnosis not present

## 2022-01-29 DIAGNOSIS — Z85828 Personal history of other malignant neoplasm of skin: Secondary | ICD-10-CM | POA: Insufficient documentation

## 2022-01-29 DIAGNOSIS — Z95828 Presence of other vascular implants and grafts: Secondary | ICD-10-CM

## 2022-01-29 LAB — CBC WITH DIFFERENTIAL/PLATELET
Abs Immature Granulocytes: 0.01 10*3/uL (ref 0.00–0.07)
Basophils Absolute: 0 10*3/uL (ref 0.0–0.1)
Basophils Relative: 1 %
Eosinophils Absolute: 0.1 10*3/uL (ref 0.0–0.5)
Eosinophils Relative: 3 %
HCT: 32.4 % — ABNORMAL LOW (ref 39.0–52.0)
Hemoglobin: 10.1 g/dL — ABNORMAL LOW (ref 13.0–17.0)
Immature Granulocytes: 0 %
Lymphocytes Relative: 19 %
Lymphs Abs: 0.7 10*3/uL (ref 0.7–4.0)
MCH: 28.3 pg (ref 26.0–34.0)
MCHC: 31.2 g/dL (ref 30.0–36.0)
MCV: 90.8 fL (ref 80.0–100.0)
Monocytes Absolute: 0.4 10*3/uL (ref 0.1–1.0)
Monocytes Relative: 11 %
Neutro Abs: 2.4 10*3/uL (ref 1.7–7.7)
Neutrophils Relative %: 66 %
Platelets: 205 10*3/uL (ref 150–400)
RBC: 3.57 MIL/uL — ABNORMAL LOW (ref 4.22–5.81)
RDW: 13.2 % (ref 11.5–15.5)
WBC: 3.6 10*3/uL — ABNORMAL LOW (ref 4.0–10.5)
nRBC: 0 % (ref 0.0–0.2)

## 2022-01-29 LAB — COMPREHENSIVE METABOLIC PANEL
ALT: 11 U/L (ref 0–44)
AST: 20 U/L (ref 15–41)
Albumin: 3 g/dL — ABNORMAL LOW (ref 3.5–5.0)
Alkaline Phosphatase: 58 U/L (ref 38–126)
Anion gap: 11 (ref 5–15)
BUN: 70 mg/dL — ABNORMAL HIGH (ref 8–23)
CO2: 27 mmol/L (ref 22–32)
Calcium: 8.4 mg/dL — ABNORMAL LOW (ref 8.9–10.3)
Chloride: 102 mmol/L (ref 98–111)
Creatinine, Ser: 7.47 mg/dL — ABNORMAL HIGH (ref 0.61–1.24)
GFR, Estimated: 7 mL/min — ABNORMAL LOW (ref >60)
Glucose, Bld: 85 mg/dL (ref 70–99)
Potassium: 3.5 mmol/L (ref 3.5–5.1)
Sodium: 140 mmol/L (ref 135–145)
Total Bilirubin: 0.5 mg/dL (ref 0.3–1.2)
Total Protein: 6 g/dL — ABNORMAL LOW (ref 6.5–8.1)

## 2022-01-29 LAB — MAGNESIUM: Magnesium: 2.1 mg/dL (ref 1.7–2.4)

## 2022-01-29 MED ORDER — SODIUM CHLORIDE 0.9 % IV SOLN: 8.0000 mg | Freq: Once | INTRAVENOUS | Status: DC

## 2022-01-29 MED ORDER — ONDANSETRON HCL 4 MG PO TABS
8.0000 mg | ORAL_TABLET | Freq: Once | ORAL | Status: AC
  Administered 2022-01-29: 8 mg via ORAL
  Filled 2022-01-29: qty 2

## 2022-01-29 MED ORDER — SODIUM CHLORIDE 0.9 % IV SOLN
225.0000 mg/m2/d | INTRAVENOUS | Status: DC
  Administered 2022-01-29: 1700 mg via INTRAVENOUS
  Filled 2022-01-29: qty 34

## 2022-01-29 NOTE — Progress Notes (Signed)
Patient presents today for chemotherapy treatment.  Patient is in satisfactory condition with no complaints voiced.  Ok to proceed with 5FU home infusion pump without lab results per Dr. Delton Coombes.  We will hold Mitomycin today per Dr. Delton Coombes as patient is on peritoneal dialysis.  We will proceed with treatment per MD orders.   Patient connected to home 5FU infusion pump.  Patient was educated on the pump and what to do in the event of a spill.  Patient voiced understanding and denied any further questions  Patient left ambulatory in stable condition.  Vital signs stable at discharge.  Follow up as scheduled.

## 2022-01-29 NOTE — Progress Notes (Signed)
[place],  [address]   CLINIC:  Medical Oncology/Hematology  PCP:  Pomposini, Cherly Anderson, MD No address on file None   REASON FOR VISIT:  Follow-up for rectal cancer  CURRENT THERAPY: Mitomycin D1,28 / 5FU D1-4, 28-31 q32d   BRIEF ONCOLOGIC HISTORY:  Oncology History  Carcinoma of glottis ([place])  11/09/2019 Cancer Staging   Staging form: Larynx - Glottis, AJCC 8th Edition - Clinical stage from 11/09/2019: Stage 0 (cTis, cN0, cM0) - Signed by Eppie Gibson, MD on 11/10/2019    11/10/2019 Initial Diagnosis   Carcinoma of glottis ([place])   Anal cancer ([place])  12/19/2021 Initial Diagnosis   Anal cancer ([place])   01/29/2022 -  Chemotherapy   Patient is on Treatment Plan : ANUS Mitomycin D1,28 / 5FU D1-4, 28-31 q32d       CANCER STAGING:  Cancer Staging  Anal cancer ([place]) Staging form: Anus, AJCC V9 - Clinical stage from 12/20/2021: Stage IIA (cT2, cN0, cM0) - Unsigned  Carcinoma of glottis ([place]) Staging form: Larynx - Glottis, AJCC 8th Edition - Clinical stage from 11/09/2019: Stage 0 (cTis, cN0, cM0) - Signed by Eppie Gibson, MD on 11/10/2019   INTERVAL HISTORY:  [name], a 88 y.o. male, returns for routine follow-up and consideration for next cycle of chemotherapy. Britney was last seen on 12/20/2021.  Due for cycle #1 of Mitomycin + 5FU today.   Overall, he tells me he has been feeling pretty well. He began radiation today. He denies any current pain and bleeding. He denies difficulty urinating and pain with BM.   Overall, he feels ready for next cycle of chemo today.    REVIEW OF SYSTEMS:  Review of Systems  Constitutional:  Negative for appetite change and fatigue.  HENT:   Negative for nosebleeds.   Respiratory:  Negative for hemoptysis.   Gastrointestinal:  Negative for blood in stool.  Genitourinary:  Negative for difficulty urinating, dysuria and hematuria.   Musculoskeletal:  Negative for arthralgias and  myalgias.  All other systems reviewed and are negative.  PAST MEDICAL/SURGICAL HISTORY:  Past Medical History:  Diagnosis Date   Anemia    Aortic valve stenosis    mild AS 04/2017 (followed by Dr. Cleora Fleet, [place])   Bilateral hearing loss    bilateral hearing aids   Cancer 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   Chronic kidney disease    ckd stage 4, bilateral cysts on kidneys [facility])   Dysrhythmia    RBBB   GERD (gastroesophageal reflux disease)    Gout    left foot   Heart murmur    no problems   History of blood transfusion 2018   History of radiation therapy 11/18/19- 12/16/19   Larynx 54 Gy in 20 fx   Hyperlipidemia    Hypertension    Port-A-Cath in place 01/05/2022   Subdural hematoma 11/2018   Wears glasses    Past Surgical History:  Procedure Laterality Date   BURR HOLE FOR SUBDURAL HEMATOMA  12/15/2018   brain   COLONOSCOPY     EYE SURGERY Bilateral    cataracts removed   MICROLARYNGOSCOPY N/A 10/23/2019   Procedure: Microlaryngoscopy with vocal cord lesion biopsy;  Surgeon: Jerrell Belfast, MD;  Location: [place];  Service: ENT;  Laterality: N/A;   [place] Right 01/15/2022   Procedure: INSERTION PORT-A-CATH RIJ;  Surgeon: Rusty Aus, DO;  Location: [facility];  Service: General;  Laterality: Right;  pt can't come earlier d/t peritoneal dialysis   UPPER GI ENDOSCOPY      SOCIAL HISTORY:  Social History   Socioeconomic History   Marital status: Married    Spouse name: Not on file   Number of children: Not on file   Years of education: Not on file   Highest education level: Not on file  Occupational History   Not on file  Tobacco Use   Smoking status: Former    Packs/day: 0.25    Years: 40.00    Pack years: 10.00    Types: Cigarettes    Quit date: 12/22/1996    Years since quitting: 25.1   Smokeless tobacco: Never   Tobacco comments:    Quit in 1998  Vaping  Use   Vaping Use: Never used  Substance and Sexual Activity   Alcohol use: Never   Drug use: Never   Sexual activity: Yes  Other Topics Concern   Not on file  Social History Narrative   Not on file   Social Determinants of Health   Financial Resource Strain: Not on file  Food Insecurity: Not on file  Transportation Needs: Not on file  Physical Activity: Not on file  Stress: Not on file  Social Connections: Not on file  Intimate Partner Violence: Not on file    FAMILY HISTORY:  No family history on file.  CURRENT MEDICATIONS:  Current Outpatient Medications  Medication Sig Dispense Refill   atorvastatin (LIPITOR) 20 MG tablet Take 20 mg by mouth daily at 6 PM.      B12-ACTIVE 1 MG CHEW Chew 1 tablet by mouth daily.     calcium acetate (PHOSLO) 667 MG tablet Take 667 mg by mouth 3 (three) times daily.     Cholecalciferol 25 MCG (1000 UT) capsule Take 1,000 Units by mouth daily.     docusate sodium (COLACE) 100 MG capsule Take 100 mg by mouth 2 (two) times daily.     epoetin alfa (EPOGEN) 10000 UNIT/ML injection Inject 1 Units into the skin every 14 (fourteen) days.     fluorouracil CALGB 65784 2,400 mg/m2 in sodium chloride 0.9 % 150 mL Inject 7,650 mg into the vein over 96 hr. Day 1, Day 28     GNP IRON 200 (65 Fe) MG TABS SMARTSIG:1 Tablet(s) By Mouth     hydrALAZINE (APRESOLINE) 100 MG tablet Take 100 mg by mouth 3 (three) times daily.     levothyroxine (SYNTHROID) 25 MCG tablet Take 37.5 mcg by mouth every morning.     lidocaine-prilocaine (EMLA) cream Apply a small amount to port a cath site and cover with plastic wrap 1 hour prior to infusion appointments 30 g 3   mirtazapine (REMERON) 15 MG tablet Take 15 mg by mouth daily.     mitoMYcin (MUTAMYCIN) 20 MG chemo injection Inject into the vein once. Day 1 and Day 28     oxyCODONE (ROXICODONE) 5 MG immediate release tablet Take 1 tablet (5 mg total) by mouth every 8 (eight) hours as needed. 10 tablet 0   pantoprazole  (PROTONIX) 40 MG tablet Take 40 mg by mouth every morning.     prochlorperazine (COMPAZINE) 10 MG tablet Take 1 tablet (10 mg total) by mouth every 6 (six) hours as needed (Nausea or vomiting). 30 tablet 1   sodium bicarbonate 650 MG tablet SMARTSIG:1 Tablet(s) By Mouth     sucroferric oxyhydroxide (VELPHORO) 500 MG chewable tablet Chew  500 tablets by mouth daily. (Patient not taking: Reported on 01/04/2022)     No current facility-administered medications for this visit.    ALLERGIES:  No Known Allergies  PHYSICAL EXAM:  Performance status (ECOG): 1 - Symptomatic but completely ambulatory  Vitals:   01/29/22 1016  BP: (!) 142/81  Pulse: 79  Resp: 18  Temp: 97.7 F (36.5 C)  SpO2: 98%   Wt Readings from Last 3 Encounters:  01/29/22 163 lb 8 oz (74.2 kg)  01/15/22 154 lb 15.7 oz (70.3 kg)  01/11/22 155 lb (70.3 kg)   Physical Exam Vitals reviewed.  Constitutional:      Appearance: Normal appearance.  Cardiovascular:     Rate and Rhythm: Normal rate and regular rhythm.     Pulses: Normal pulses.     Heart sounds: Normal heart sounds.  Pulmonary:     Effort: Pulmonary effort is normal.     Breath sounds: Normal breath sounds.  Neurological:     General: No focal deficit present.     Mental Status: He is alert and oriented to person, place, and time.  Psychiatric:        Mood and Affect: Mood normal.        Behavior: Behavior normal.    LABORATORY DATA:  I have reviewed the labs as listed.  CBC Latest Ref Rng & Units 01/15/2022 12/20/2021 10/23/2019  WBC 4.0 - 10.5 K/uL - 2.6(L) 3.8(L)  Hemoglobin 13.0 - 17.0 g/dL 13.6 11.3(L) 10.0(L)  Hematocrit 39.0 - 52.0 % 40.0 35.7(L) 31.5(L)  Platelets 150 - 400 K/uL - 190 224   CMP Latest Ref Rng & Units 01/15/2022 10/23/2019  Glucose 70 - 99 mg/dL 94 102(H)  BUN 8 - 23 mg/dL 57(H) 42(H)  Creatinine 0.61 - 1.24 mg/dL 7.50(H) 3.86(H)  Sodium 135 - 145 mmol/L 141 140  Potassium 3.5 - 5.1 mmol/L 3.9 3.3(L)  Chloride 98 - 111  mmol/L 105 107  CO2 22 - 32 mmol/L - 24  Calcium 8.9 - 10.3 mg/dL - 8.7(L)    DIAGNOSTIC IMAGING:  I have independently reviewed the scans and discussed with the patient. DG Chest Port 1 View  Result Date: 01/15/2022 CLINICAL DATA:  Placement of right IJ chest port EXAM: PORTABLE CHEST 1 VIEW COMPARISON:  None. FINDINGS: Transverse diameter of heart is increased. There are no signs of pulmonary edema or focal pulmonary consolidation. There is no pleural effusion or pneumothorax. Tip of right IJ central venous catheter is seen in the superior vena cava. IMPRESSION: Cardiomegaly. There are no signs of pulmonary edema or focal pulmonary consolidation. Tip of right IJ chest port is seen in the superior vena cava. There is no pneumothorax. Electronically Signed   By: Elmer Picker M.D.   On: 01/15/2022 15:02   DG C-Arm 1-60 Min-No Report  Result Date: 01/15/2022 Fluoroscopy was utilized by the requesting physician.  No radiographic interpretation.     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 - EGD and colonoscopy on 12/08/2021 with grossly normal EGD.  Colonoscopy showed 2 cm mass with cratered ulcer with distal margin abutting the anal verge. 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 - Reports 30 pound weight loss unintentional in the last 6 months.  He was also started on hemodialysis in September 2022. - MRI rectal cancer protocol (at [facility] clinic) shows T1/T2.  No lymph nodes were  seen.  Craniocaudal length of the tumor is 3.3 cm. - Rectal exam showed mass palpable in the anal canal about 2 cm from the anal verge, predominantly on the right side. - XRT with 5-FU started on 01/29/2022.    Social/family history: - Lives at home with his wife.  He is a retired Glass blower/designer at [employer].  Quit smoking 24 years  ago.  No chemical exposure.  No family history of malignancies.  3.  ESRD: - He is on peritoneal dialysis at home, started on 08/23/2021. - He is followed by Dr. Elita Quick in [place].  4.  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 - Rather than combination 5-FU and mitomycin, I have recommended single agent 5-FU, infusional daily from Monday through Friday. - We discussed side effects in detail. - Reviewed labs today which showed normal LFTs.  CBC shows white count 3.6 and hemoglobin 10.1. - He may proceed with 5-FU pump today. - RTC 1 week for follow-up.  2.  Normocytic anemia: - Labs on 12/20/2021 shows ferritin 330, percent saturation 46.  G81, folic acid and copper levels were normal. - However methylmalonic acid level was elevated at 523. - We will give him B12 injection next week.  We will also start B12 oral supplements.   Orders placed this encounter:  No orders of the defined types were placed in this encounter.    Derek Jack, MD [place] [phone]   I, Thana Ates, am acting as a scribe for Dr. Derek Jack.  I, Derek Jack MD, have reviewed the above documentation for accuracy and completeness, and I agree with the above.

## 2022-01-29 NOTE — Patient Instructions (Signed)
[facility]  Discharge Instructions: Thank you for choosing [facility] to provide your oncology and hematology care.  If you have a lab appointment with the Cancer Center, please come in thru the Main Entrance and check in at the main information desk.  Wear comfortable clothing and clothing appropriate for easy access to any Portacath or PICC line.   We strive to give you quality time with your provider. You may need to reschedule your appointment if you arrive late (15 or more minutes).  Arriving late affects you and other patients whose appointments are after yours.  Also, if you miss three or more appointments without notifying the office, you may be dismissed from the clinic at the provider's discretion.      For prescription refill requests, have your pharmacy contact our office and allow 72 hours for refills to be completed.        To help prevent nausea and vomiting after your treatment, we encourage you to take your nausea medication as directed.  BELOW ARE SYMPTOMS THAT SHOULD BE REPORTED IMMEDIATELY: *FEVER GREATER THAN 100.4 F (38 C) OR HIGHER *CHILLS OR SWEATING *NAUSEA AND VOMITING THAT IS NOT CONTROLLED WITH YOUR NAUSEA MEDICATION *UNUSUAL SHORTNESS OF BREATH *UNUSUAL BRUISING OR BLEEDING *URINARY PROBLEMS (pain or burning when urinating, or frequent urination) *BOWEL PROBLEMS (unusual diarrhea, constipation, pain near the anus) TENDERNESS IN MOUTH AND THROAT WITH OR WITHOUT PRESENCE OF ULCERS (sore throat, sores in mouth, or a toothache) UNUSUAL RASH, SWELLING OR PAIN  UNUSUAL VAGINAL DISCHARGE OR ITCHING   Items with * indicate a potential emergency and should be followed up as soon as possible or go to the Emergency Department if any problems should occur.  Please show the CHEMOTHERAPY ALERT CARD or IMMUNOTHERAPY ALERT CARD at check-in to the Emergency Department and triage nurse.  Should you have questions after your visit or need to cancel  or reschedule your appointment, please contact  CANCER CENTER [phone]  and follow the prompts.  Office hours are 8:00 a.m. to 4:30 p.m. Monday - Friday. Please note that voicemails left after 4:00 p.m. may not be returned until the following business day.  We are closed weekends and major holidays. You have access to a nurse at all times for urgent questions. Please call the main number to the clinic [phone] and follow the prompts.  For any non-urgent questions, you may also contact your provider using MyChart. We now offer e-Visits for anyone 18 and older to request care online for non-urgent symptoms. For details visit [email or web address].   Also download the MyChart app! Go to the app store, search "MyChart", open the app, select [facility], and log in with your MyChart username and password.  Due to Covid, a mask is required upon entering the hospital/clinic. If you do not have a mask, one will be given to you upon arrival. For doctor visits, patients may have 1 support person aged 18 or older with them. For treatment visits, patients cannot have anyone with them due to current Covid guidelines and our immunocompromised population.  

## 2022-01-29 NOTE — Patient Instructions (Addendum)
[place] at [facility] Discharge Instructions   You were seen and examined today by Dr. Delton Coombes.  He reviewed the results of your lab work which is normal/stable.  We will not be giving you mitomycin due to your dialysis status and your age.  We will give you only single agent fluorouracil (5FU). We will now be brining you in every Monday while you are getting radiation to put you on the pump.  You will wear the pump Monday through Friday each week while you are receiving radiation since we are not going to be giving you the mitomycin.   We will proceed with your treatment today.  We will put you on the pump today and you will return Friday to have this pump taken off.   Return as scheduled.      Thank you for choosing [place] at [facility] to provide your oncology and hematology care.  To afford each patient quality time with our provider, please arrive at least 15 minutes before your scheduled appointment time.   If you have a lab appointment with the [facility] please come in thru the Main Entrance and check in at the main information desk.  You need to re-schedule your appointment should you arrive 10 or more minutes late.  We strive to give you quality time with our providers, and arriving late affects you and other patients whose appointments are after yours.  Also, if you no show three or more times for appointments you may be dismissed from the clinic at the providers discretion.     Again, thank you for choosing [facility].  Our hope is that these requests will decrease the amount of time that you wait before being seen by our physicians.       _____________________________________________________________  Should you have questions after your visit to [facility], please contact our office at [phone] and follow the prompts.  Our office hours are 8:00 a.m. and 4:30 p.m. Monday - Friday.   Please note that voicemails left after 4:00 p.m. may not be returned until the following business day.  We are closed weekends and major holidays.  You do have access to a nurse 24-7, just call the main number to the clinic [phone] and do not press any options, hold on the line and a nurse will answer the phone.    For prescription refill requests, have your pharmacy contact our office and allow 72 hours.    Due to Covid, you will need to wear a mask upon entering the hospital. If you do not have a mask, a mask will be given to you at the Main Entrance upon arrival. For doctor visits, patients may have 1 support person age 50 or older with them. For treatment visits, patients can not have anyone with them due to social distancing guidelines and our immunocompromised population.

## 2022-01-29 NOTE — Progress Notes (Signed)
° °  Order from Dr Delton Coombes with new dose plan.    Discontinue Mitomycin due to dialysis.  V.O. Dr Rush Farmer, PharmD

## 2022-02-02 ENCOUNTER — Other Ambulatory Visit: Payer: Self-pay

## 2022-02-02 ENCOUNTER — Inpatient Hospital Stay (HOSPITAL_COMMUNITY): Payer: Medicare Other

## 2022-02-02 VITALS — BP 151/74 | HR 75 | Temp 97.4°F | Resp 18

## 2022-02-02 DIAGNOSIS — C210 Malignant neoplasm of anus, unspecified: Secondary | ICD-10-CM

## 2022-02-02 DIAGNOSIS — Z95828 Presence of other vascular implants and grafts: Secondary | ICD-10-CM

## 2022-02-02 DIAGNOSIS — Z5111 Encounter for antineoplastic chemotherapy: Secondary | ICD-10-CM | POA: Diagnosis not present

## 2022-02-02 MED ORDER — SODIUM CHLORIDE 0.9% FLUSH
10.0000 mL | INTRAVENOUS | Status: DC | PRN
  Administered 2022-02-02: 10 mL

## 2022-02-02 MED ORDER — HEPARIN SOD (PORK) LOCK FLUSH 100 UNIT/ML IV SOLN
500.0000 [IU] | Freq: Once | INTRAVENOUS | Status: AC | PRN
  Administered 2022-02-02: 500 [IU]

## 2022-02-02 MED ORDER — CYANOCOBALAMIN 1000 MCG/ML IJ SOLN
1000.0000 ug | Freq: Once | INTRAMUSCULAR | Status: AC
  Administered 2022-02-02: 1000 ug via INTRAMUSCULAR
  Filled 2022-02-02 (×2): qty 1

## 2022-02-02 NOTE — Progress Notes (Signed)
Patient presents today for B12 injection, 5FU pump stop and disconnection after continous infusion.   5FU pump deaccessed.  Patients port flushed without difficulty.  Good blood return noted with no bruising or swelling noted at site.  Needle removed intact.  Band aid applied.   Stable during B12 administration without incident; injection site WNL; see MAR for injection details.  Patient tolerated procedure well and without incident.  No questions or complaints noted at this time. VSS with discharge and left in satisfactory condition via wheelchair with no s/s of distress noted.    Patient states that he is taking oral B12 at home.

## 2022-02-02 NOTE — Patient Instructions (Signed)
[facility]  Discharge Instructions: Thank you for choosing [facility] to provide your oncology and hematology care.  If you have a lab appointment with the [facility], please come in thru the Main Entrance and check in at the main information desk.  Wear comfortable clothing and clothing appropriate for easy access to any Portacath or PICC line.   We strive to give you quality time with your provider. You may need to reschedule your appointment if you arrive late (15 or more minutes).  Arriving late affects you and other patients whose appointments are after yours.  Also, if you miss three or more appointments without notifying the office, you may be dismissed from the clinic at the providers discretion.      For prescription refill requests, have your pharmacy contact our office and allow 72 hours for refills to be completed.    Today you received the following chemotherapy and/or immunotherapy agents Pump disconnect and B12 injection      To help prevent nausea and vomiting after your treatment, we encourage you to take your nausea medication as directed.  BELOW ARE SYMPTOMS THAT SHOULD BE REPORTED IMMEDIATELY: *FEVER GREATER THAN 100.4 F (38 C) OR HIGHER *CHILLS OR SWEATING *NAUSEA AND VOMITING THAT IS NOT CONTROLLED WITH YOUR NAUSEA MEDICATION *UNUSUAL SHORTNESS OF BREATH *UNUSUAL BRUISING OR BLEEDING *URINARY PROBLEMS (pain or burning when urinating, or frequent urination) *BOWEL PROBLEMS (unusual diarrhea, constipation, pain near the anus) TENDERNESS IN MOUTH AND THROAT WITH OR WITHOUT PRESENCE OF ULCERS (sore throat, sores in mouth, or a toothache) UNUSUAL RASH, SWELLING OR PAIN  UNUSUAL VAGINAL DISCHARGE OR ITCHING   Items with * indicate a potential emergency and should be followed up as soon as possible or go to the Emergency Department if any problems should occur.  Please show the CHEMOTHERAPY ALERT CARD or IMMUNOTHERAPY ALERT CARD at check-in  to the Emergency Department and triage nurse.  Should you have questions after your visit or need to cancel or reschedule your appointment, please contact [facility] [phone]  and follow the prompts.  Office hours are 8:00 a.m. to 4:30 p.m. Monday - Friday. Please note that voicemails left after 4:00 p.m. may not be returned until the following business day.  We are closed weekends and major holidays. You have access to a nurse at all times for urgent questions. Please call the main number to the clinic [phone] and follow the prompts.  For any non-urgent questions, you may also contact your provider using MyChart. We now offer e-Visits for anyone 60 and older to request care online for non-urgent symptoms. For details visit [email or web address].   Also download the MyChart app! Go to the app store, search "MyChart", open the app, select [facility], and log in with your MyChart username and password.  Due to Covid, a mask is required upon entering the hospital/clinic. If you do not have a mask, one will be given to you upon arrival. For doctor visits, patients may have 1 support person aged 29 or older with them. For treatment visits, patients cannot have anyone with them due to current Covid guidelines and our immunocompromised population.

## 2022-02-05 ENCOUNTER — Encounter (HOSPITAL_COMMUNITY): Payer: Self-pay | Admitting: Hematology

## 2022-02-05 ENCOUNTER — Inpatient Hospital Stay (HOSPITAL_COMMUNITY): Payer: Medicare Other

## 2022-02-05 ENCOUNTER — Inpatient Hospital Stay (HOSPITAL_BASED_OUTPATIENT_CLINIC_OR_DEPARTMENT_OTHER): Payer: Medicare Other | Admitting: Hematology

## 2022-02-05 ENCOUNTER — Encounter (HOSPITAL_COMMUNITY): Payer: Self-pay | Admitting: *Deleted

## 2022-02-05 ENCOUNTER — Other Ambulatory Visit (HOSPITAL_COMMUNITY): Payer: Medicare Other

## 2022-02-05 ENCOUNTER — Other Ambulatory Visit: Payer: Self-pay

## 2022-02-05 VITALS — BP 148/77 | HR 69 | Temp 96.2°F | Resp 17 | Ht 71.0 in | Wt 166.8 lb

## 2022-02-05 DIAGNOSIS — C210 Malignant neoplasm of anus, unspecified: Secondary | ICD-10-CM

## 2022-02-05 DIAGNOSIS — D518 Other vitamin B12 deficiency anemias: Secondary | ICD-10-CM

## 2022-02-05 DIAGNOSIS — Z5111 Encounter for antineoplastic chemotherapy: Secondary | ICD-10-CM | POA: Diagnosis not present

## 2022-02-05 DIAGNOSIS — Z95828 Presence of other vascular implants and grafts: Secondary | ICD-10-CM

## 2022-02-05 LAB — COMPREHENSIVE METABOLIC PANEL
ALT: 13 U/L (ref 0–44)
AST: 22 U/L (ref 15–41)
Albumin: 2.9 g/dL — ABNORMAL LOW (ref 3.5–5.0)
Alkaline Phosphatase: 53 U/L (ref 38–126)
Anion gap: 11 (ref 5–15)
BUN: 77 mg/dL — ABNORMAL HIGH (ref 8–23)
CO2: 27 mmol/L (ref 22–32)
Calcium: 8.3 mg/dL — ABNORMAL LOW (ref 8.9–10.3)
Chloride: 101 mmol/L (ref 98–111)
Creatinine, Ser: 7.29 mg/dL — ABNORMAL HIGH (ref 0.61–1.24)
GFR, Estimated: 7 mL/min — ABNORMAL LOW (ref >60)
Glucose, Bld: 104 mg/dL — ABNORMAL HIGH (ref 70–99)
Potassium: 3.4 mmol/L — ABNORMAL LOW (ref 3.5–5.1)
Sodium: 139 mmol/L (ref 135–145)
Total Bilirubin: 0.6 mg/dL (ref 0.3–1.2)
Total Protein: 5.6 g/dL — ABNORMAL LOW (ref 6.5–8.1)

## 2022-02-05 LAB — CBC WITH DIFFERENTIAL/PLATELET
Abs Immature Granulocytes: 0.01 10*3/uL (ref 0.00–0.07)
Basophils Absolute: 0 10*3/uL (ref 0.0–0.1)
Basophils Relative: 1 %
Eosinophils Absolute: 0.1 10*3/uL (ref 0.0–0.5)
Eosinophils Relative: 4 %
HCT: 29.2 % — ABNORMAL LOW (ref 39.0–52.0)
Hemoglobin: 9.5 g/dL — ABNORMAL LOW (ref 13.0–17.0)
Immature Granulocytes: 0 %
Lymphocytes Relative: 17 %
Lymphs Abs: 0.4 10*3/uL — ABNORMAL LOW (ref 0.7–4.0)
MCH: 29.3 pg (ref 26.0–34.0)
MCHC: 32.5 g/dL (ref 30.0–36.0)
MCV: 90.1 fL (ref 80.0–100.0)
Monocytes Absolute: 0.2 10*3/uL (ref 0.1–1.0)
Monocytes Relative: 8 %
Neutro Abs: 1.7 10*3/uL (ref 1.7–7.7)
Neutrophils Relative %: 70 %
Platelets: 149 10*3/uL — ABNORMAL LOW (ref 150–400)
RBC: 3.24 MIL/uL — ABNORMAL LOW (ref 4.22–5.81)
RDW: 13.1 % (ref 11.5–15.5)
WBC: 2.4 10*3/uL — ABNORMAL LOW (ref 4.0–10.5)
nRBC: 0 % (ref 0.0–0.2)

## 2022-02-05 LAB — IRON AND TIBC
Iron: 135 ug/dL (ref 45–182)
Saturation Ratios: 64 % — ABNORMAL HIGH (ref 17.9–39.5)
TIBC: 209 ug/dL — ABNORMAL LOW (ref 250–450)
UIBC: 74 ug/dL

## 2022-02-05 LAB — FERRITIN: Ferritin: 458 ng/mL — ABNORMAL HIGH (ref 24–336)

## 2022-02-05 LAB — MAGNESIUM: Magnesium: 2 mg/dL (ref 1.7–2.4)

## 2022-02-05 LAB — VITAMIN B12: Vitamin B-12: >7500 pg/mL — ABNORMAL HIGH (ref 180–914)

## 2022-02-05 LAB — FOLATE: Folate: 10.4 ng/mL (ref >5.9)

## 2022-02-05 MED ORDER — SODIUM CHLORIDE 0.9 % IV SOLN
225.0000 mg/m2/d | INTRAVENOUS | Status: DC
  Administered 2022-02-05: 1700 mg via INTRAVENOUS
  Filled 2022-02-05: qty 34

## 2022-02-05 MED ORDER — ONDANSETRON HCL 4 MG PO TABS
8.0000 mg | ORAL_TABLET | Freq: Once | ORAL | Status: AC
  Administered 2022-02-05: 8 mg via ORAL
  Filled 2022-02-05: qty 2

## 2022-02-05 NOTE — Progress Notes (Signed)
Patients port flushed without difficulty.  Good blood return noted with no bruising or swelling noted at site.  Patient remains accessed for chemotherapy treatment. Patient is in stable condition.  °

## 2022-02-05 NOTE — Patient Instructions (Addendum)
[facility] Discharge Instructions   You were seen and examined today by Dr. Katragadda.  He reviewed the results of your lab work which is normal/stable.   We will proceed with your treatment today.  Return as scheduled.    Thank you for choosing  [facility] to provide your oncology and hematology care.  To afford each patient quality time with our provider, please arrive at least 15 minutes before your scheduled appointment time.   If you have a lab appointment with the Cancer Center please come in thru the Main Entrance and check in at the main information desk.  You need to re-schedule your appointment should you arrive 10 or more minutes late.  We strive to give you quality time with our providers, and arriving late affects you and other patients whose appointments are after yours.  Also, if you no show three or more times for appointments you may be dismissed from the clinic at the providers discretion.     Again, thank you for choosing [facility].  Our hope is that these requests will decrease the amount of time that you wait before being seen by our physicians.       _____________________________________________________________  Should you have questions after your visit to [facility], please contact our office at [phone] and follow the prompts.  Our office hours are 8:00 a.m. and 4:30 p.m. Monday - Friday.  Please note that voicemails left after 4:00 p.m. may not be returned until the following business day.  We are closed weekends and major holidays.  You do have access to a nurse 24-7, just call the main number to the clinic [phone] and do not press any options, hold on the line and a nurse will answer the phone.    For prescription refill requests, have your pharmacy contact our office and allow 72 hours.    Due to Covid, you will need to wear a mask upon entering the  hospital. If you do not have a mask, a mask will be given to you at the Main Entrance upon arrival. For doctor visits, patients may have 1 support person age 18 or older with them. For treatment visits, patients can not have anyone with them due to social distancing guidelines and our immunocompromised population.      

## 2022-02-05 NOTE — Progress Notes (Signed)
Patient has been examined by Dr. Katragadda, and vital signs and labs have been reviewed. ANC, Creatinine, LFTs, hemoglobin, and platelets are within treatment parameters per M.D. - pt may proceed with treatment.    °

## 2022-02-05 NOTE — Progress Notes (Signed)
Patient presents today for 5FU pump connection per providers order.   Vital signs within parameters for treatment.  Labs pending.  Per Henreitta Leber pharmacist patient doesn't have to wait thirty minutes after PO Zofran for pump connect.  5FU pump start today per MD orders.  Stable during connection without adverse affects.  Vital signs stable.  Verified RUN on the screen with patient.  No complaints at this time.  Discharge from clinic ambulatory in stable condition.  Alert and oriented X 3.  Follow up with [facility] as scheduled.

## 2022-02-05 NOTE — Patient Instructions (Signed)
[facility]  Discharge Instructions: Thank you for choosing [facility] to provide your oncology and hematology care.  If you have a lab appointment with the [facility], please come in thru the Main Entrance and check in at the main information desk.  Wear comfortable clothing and clothing appropriate for easy access to any Portacath or PICC line.   We strive to give you quality time with your provider. You may need to reschedule your appointment if you arrive late (15 or more minutes).  Arriving late affects you and other patients whose appointments are after yours.  Also, if you miss three or more appointments without notifying the office, you may be dismissed from the clinic at the providers discretion.      For prescription refill requests, have your pharmacy contact our office and allow 72 hours for refills to be completed.    Today you received the following chemotherapy and/or immunotherapy agents 5FU pump start      To help prevent nausea and vomiting after your treatment, we encourage you to take your nausea medication as directed.  BELOW ARE SYMPTOMS THAT SHOULD BE REPORTED IMMEDIATELY: *FEVER GREATER THAN 100.4 F (38 C) OR HIGHER *CHILLS OR SWEATING *NAUSEA AND VOMITING THAT IS NOT CONTROLLED WITH YOUR NAUSEA MEDICATION *UNUSUAL SHORTNESS OF BREATH *UNUSUAL BRUISING OR BLEEDING *URINARY PROBLEMS (pain or burning when urinating, or frequent urination) *BOWEL PROBLEMS (unusual diarrhea, constipation, pain near the anus) TENDERNESS IN MOUTH AND THROAT WITH OR WITHOUT PRESENCE OF ULCERS (sore throat, sores in mouth, or a toothache) UNUSUAL RASH, SWELLING OR PAIN  UNUSUAL VAGINAL DISCHARGE OR ITCHING   Items with * indicate a potential emergency and should be followed up as soon as possible or go to the Emergency Department if any problems should occur.  Please show the CHEMOTHERAPY ALERT CARD or IMMUNOTHERAPY ALERT CARD at check-in to the Emergency  Department and triage nurse.  Should you have questions after your visit or need to cancel or reschedule your appointment, please contact [facility] [phone]  and follow the prompts.  Office hours are 8:00 a.m. to 4:30 p.m. Monday - Friday. Please note that voicemails left after 4:00 p.m. may not be returned until the following business day.  We are closed weekends and major holidays. You have access to a nurse at all times for urgent questions. Please call the main number to the clinic [phone] and follow the prompts.  For any non-urgent questions, you may also contact your provider using MyChart. We now offer e-Visits for anyone 86 and older to request care online for non-urgent symptoms. For details visit [email or web address].   Also download the MyChart app! Go to the app store, search "MyChart", open the app, select [facility], and log in with your MyChart username and password.  Due to Covid, a mask is required upon entering the hospital/clinic. If you do not have a mask, one will be given to you upon arrival. For doctor visits, patients may have 1 support person aged 86 or older with them. For treatment visits, patients cannot have anyone with them due to current Covid guidelines and our immunocompromised population.

## 2022-02-05 NOTE — Progress Notes (Signed)
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, a 88 y.o. male, returns for routine follow-up and consideration for next cycle of chemotherapy. Jason Avila was last seen on 02/31/2023.  Due for cycle #2 of 5FU today.   Overall, he tells me he has been feeling pretty well. He denies n/v/d and mouth sores. His appetite is stable. He has gained 3 lbs since 01/29/2022. He denies hematochezia.   Overall, he feels ready for next cycle of chemo today.   REVIEW OF SYSTEMS:  Review of Systems  Constitutional:  Positive for fatigue. Negative for appetite change and unexpected weight change.  HENT:   Negative for mouth sores.   Gastrointestinal:  Negative for blood in stool, diarrhea, nausea and vomiting.  All other systems reviewed and are negative.  PAST MEDICAL/SURGICAL HISTORY:  Past Medical History:  Diagnosis Date   Anemia     Aortic valve stenosis    mild AS 04/2017 (followed by Dr. 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   Chronic kidney disease    ckd stage 4, bilateral cysts on kidneys [facility])   Dysrhythmia    RBBB   GERD (gastroesophageal reflux disease)    Gout    left foot   Heart murmur    no problems   History of blood transfusion 2018   History of radiation therapy 11/18/19- 12/16/19   Larynx 54 Gy in 20 fx   Hyperlipidemia    Hypertension    Port-A-Cath in place 01/05/2022   Subdural hematoma 11/2018   Wears glasses    Past Surgical History:  Procedure Laterality Date   BURR HOLE FOR SUBDURAL HEMATOMA  12/15/2018   brain   COLONOSCOPY     EYE SURGERY Bilateral    cataracts removed   MICROLARYNGOSCOPY N/A 10/23/2019   Procedure: Microlaryngoscopy with vocal cord lesion biopsy;  Surgeon: Jerrell Belfast, MD;  Location: [facility];  Service: ENT;  Laterality: N/A;   PERITONEAL CATHETER INSERTION     PORTACATH PLACEMENT Right 01/15/2022   Procedure: INSERTION PORT-A-CATH RIJ;  Surgeon: Rusty Aus, DO;  Location: [facility];  Service: General;  Laterality: Right;  pt can't come earlier d/t peritoneal dialysis   UPPER GI  ENDOSCOPY      SOCIAL HISTORY:  Social History   Socioeconomic History   Marital status: Married    Spouse name: Not on file   Number of children: Not on file   Years of education: Not on file   Highest education level: Not on file  Occupational History   Not on file  Tobacco Use   Smoking status: Former    Packs/day: 0.25    Years: 40.00    Pack years: 10.00    Types: Cigarettes    Quit date: 12/22/1996    Years since quitting: 25.1   Smokeless tobacco: Never   Tobacco comments:    Quit in 1998  Vaping Use   Vaping Use: Never used  Substance and Sexual Activity   Alcohol use: Never   Drug use: Never   Sexual activity: Yes  Other Topics  Concern   Not on file  Social History Narrative   Not on file   Social Determinants of Health   Financial Resource Strain: Not on file  Food Insecurity: Not on file  Transportation Needs: Not on file  Physical Activity: Not on file  Stress: Not on file  Social Connections: Not on file  Intimate Partner Violence: Not on file    FAMILY HISTORY:  History reviewed. No pertinent family history.  CURRENT MEDICATIONS:  Current Outpatient Medications  Medication Sig Dispense Refill   atorvastatin (LIPITOR) 20 MG tablet Take 20 mg by mouth daily at 6 PM.      B12-ACTIVE 1 MG CHEW Chew 1 tablet by mouth daily.     calcium acetate (PHOSLO) 667 MG tablet Take 667 mg by mouth 3 (three) times daily.     Cholecalciferol 25 MCG (1000 UT) capsule Take 1,000 Units by mouth daily.     docusate sodium (COLACE) 100 MG capsule Take 100 mg by mouth 2 (two) times daily.     epoetin alfa (EPOGEN) 10000 UNIT/ML injection Inject 1 Units into the skin every 14 (fourteen) days.     fluorouracil CALGB 30076 2,400 mg/m2 in sodium chloride 0.9 % 150 mL Inject 7,650 mg into the vein over 96 hr. Day 1, Day 28     GNP IRON 200 (65 Fe) MG TABS SMARTSIG:1 Tablet(s) By Mouth     hydrALAZINE (APRESOLINE) 100 MG tablet Take 100 mg by mouth 3 (three) times daily.     levothyroxine (SYNTHROID) 25 MCG tablet Take 37.5 mcg by mouth every morning.     lidocaine-prilocaine (EMLA) cream Apply a small amount to port a cath site and cover with plastic wrap 1 hour prior to infusion appointments 30 g 3   mirtazapine (REMERON) 15 MG tablet Take 15 mg by mouth daily.     mitoMYcin (MUTAMYCIN) 20 MG chemo injection Inject into the vein once. Day 1 and Day 28     oxyCODONE (ROXICODONE) 5 MG immediate release tablet Take 1 tablet (5 mg total) by mouth every 8 (eight) hours as needed. 10 tablet 0   pantoprazole (PROTONIX) 40 MG tablet Take 40 mg by mouth every morning.     prochlorperazine (COMPAZINE) 10 MG tablet Take 1 tablet (10 mg  total) by mouth every 6 (six) hours as needed (Nausea or vomiting). 30 tablet 1   sodium bicarbonate 650 MG tablet SMARTSIG:1 Tablet(s) By Mouth     sucroferric oxyhydroxide (VELPHORO) 500 MG chewable tablet Chew 500 tablets by mouth daily.     No current facility-administered medications for this visit.    ALLERGIES:  No Known Allergies  PHYSICAL EXAM:  Performance status (ECOG): 1 - Symptomatic but completely ambulatory  Vitals:   02/05/22 0937 02/05/22 0959  BP: (!) 171/76 (!) 148/77  Pulse: 69   Resp: 17   Temp: (!) 96.2 F (35.7 C)   SpO2: 98%    Wt Readings from Last 3 Encounters:  02/05/22 166 lb 12.8 oz (75.7 kg)  01/29/22 163 lb 8 oz (74.2 kg)  01/15/22 154 lb 15.7 oz (70.3 kg)   Physical Exam Vitals reviewed.  Constitutional:      Appearance: Normal appearance.  Cardiovascular:     Rate and Rhythm: Normal rate and regular rhythm.     Pulses: Normal pulses.     Heart sounds: Normal heart sounds.  Pulmonary:     Effort: Pulmonary effort is normal.     Breath sounds: Normal breath sounds.  Musculoskeletal:     Right lower leg: No edema.     Left lower leg: No edema.  Neurological:     General: No focal deficit present.     Mental Status: He is alert and oriented to person, place, and time.  Psychiatric:        Mood and Affect: Mood normal.        Behavior: Behavior normal.    LABORATORY DATA:  I have reviewed the labs as listed.  CBC Latest Ref Rng & Units 01/29/2022 01/15/2022 12/20/2021  WBC 4.0 - 10.5 K/uL 3.6(L) - 2.6(L)  Hemoglobin 13.0 - 17.0 g/dL 10.1(L) 13.6 11.3(L)  Hematocrit 39.0 - 52.0 % 32.4(L) 40.0 35.7(L)  Platelets 150 - 400 K/uL 205 - 190   CMP Latest Ref Rng & Units 01/29/2022 01/15/2022 10/23/2019  Glucose 70 - 99 mg/dL 85 94 102(H)  BUN 8 - 23 mg/dL 70(H) 57(H) 42(H)  Creatinine 0.61 - 1.24 mg/dL 7.47(H) 7.50(H) 3.86(H)  Sodium 135 - 145 mmol/L 140 141 140  Potassium 3.5 - 5.1 mmol/L 3.5 3.9 3.3(L)  Chloride 98 - 111 mmol/L 102 105  107  CO2 22 - 32 mmol/L 27 - 24  Calcium 8.9 - 10.3 mg/dL 8.4(L) - 8.7(L)  Total Protein 6.5 - 8.1 g/dL 6.0(L) - -  Total Bilirubin 0.3 - 1.2 mg/dL 0.5 - -  Alkaline Phos 38 - 126 U/L 58 - -  AST 15 - 41 U/L 20 - -  ALT 0 - 44 U/L 11 - -    DIAGNOSTIC IMAGING:  I have independently reviewed the scans and discussed with the patient. DG Chest Port 1 View  Result Date: 01/15/2022 CLINICAL DATA:  Placement of right IJ chest port EXAM: PORTABLE CHEST 1 VIEW COMPARISON:  None. FINDINGS: Transverse diameter of heart is increased. There are no signs of pulmonary edema or focal pulmonary consolidation. There is no pleural effusion or pneumothorax. Tip of right IJ central venous catheter is seen in the superior vena cava. IMPRESSION: Cardiomegaly. There are no signs of pulmonary edema or focal pulmonary consolidation. Tip of right IJ chest port is seen in the superior vena cava. There is no pneumothorax. Electronically Signed   By: Elmer Picker M.D.   On: 01/15/2022 15:02   DG C-Arm 1-60 Min-No Report  Result Date: 01/15/2022 Fluoroscopy was utilized by the requesting physician.  No radiographic interpretation.     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 - EGD and colonoscopy on 12/08/2021 with grossly normal EGD.  Colonoscopy showed  2 cm mass with cratered ulcer with distal margin abutting the anal verge. 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 - Reports 30 pound weight loss unintentional in the last 6 months.  He was also started on hemodialysis in September 2022. - MRI rectal cancer protocol (at [facility] clinic) shows T1/T2.  No lymph nodes were seen.  Craniocaudal length of the tumor is 3.3 cm. - Rectal exam showed mass palpable in the anal canal about 2 cm from the anal verge, predominantly on the right side. - XRT  with 5-FU started on 01/29/2022.    Social/family history: - Lives at home with his wife.  He is a retired Glass blower/designer at [employer].  Quit smoking 24 years ago.  No chemical exposure.  No family history of malignancies.  3.  ESRD: - He is on peritoneal dialysis at home, started on 08/23/2021. - He is followed by Dr. Elita Quick in [place].  4.  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 - Did not experience any major GI side effects.  Weight has been stable.  No mucositis or hand-foot skin reaction noted.  He felt slightly tired. - Reviewed labs today which showed white count 2.4 with ANC 1.7.  Platelet count is also slightly low at 149.  Hemoglobin is 9.5.  LFTs are grossly normal. - Proceed with week 2 of 5-FU continuous infusion Monday through Friday without any dose modifications.  RTC 1 week for follow-up with repeat labs.  2.  B12 deficiency: - He has received B12 injection last week. - Continue B12 supplements 1 mg daily.   Orders placed this encounter:  No orders of the defined types were placed in this encounter.    Derek Jack, MD [place] [phone]   I, Thana Ates, am acting as a scribe for Dr. Derek Jack.  I, Derek Jack MD, have reviewed the above documentation for accuracy and completeness, and I agree with the above.

## 2022-02-09 ENCOUNTER — Inpatient Hospital Stay (HOSPITAL_COMMUNITY): Payer: Medicare Other

## 2022-02-09 ENCOUNTER — Other Ambulatory Visit: Payer: Self-pay

## 2022-02-09 VITALS — BP 145/78 | HR 77 | Temp 98.4°F | Resp 18

## 2022-02-09 DIAGNOSIS — Z5111 Encounter for antineoplastic chemotherapy: Secondary | ICD-10-CM | POA: Diagnosis not present

## 2022-02-09 DIAGNOSIS — Z95828 Presence of other vascular implants and grafts: Secondary | ICD-10-CM

## 2022-02-09 DIAGNOSIS — C210 Malignant neoplasm of anus, unspecified: Secondary | ICD-10-CM

## 2022-02-09 MED ORDER — HEPARIN SOD (PORK) LOCK FLUSH 100 UNIT/ML IV SOLN
500.0000 [IU] | Freq: Once | INTRAVENOUS | Status: AC | PRN
  Administered 2022-02-09: 500 [IU]

## 2022-02-09 MED ORDER — SODIUM CHLORIDE 0.9% FLUSH
10.0000 mL | INTRAVENOUS | Status: DC | PRN
  Administered 2022-02-09: 10 mL

## 2022-02-09 NOTE — Patient Instructions (Signed)
[place]  Discharge Instructions: Thank you for choosing [facility] to provide your oncology and hematology care.  If you have a lab appointment with the [facility], please come in thru the Main Entrance and check in at the main information desk.  Wear comfortable clothing and clothing appropriate for easy access to any Portacath or PICC line.   We strive to give you quality time with your provider. You may need to reschedule your appointment if you arrive late (15 or more minutes).  Arriving late affects you and other patients whose appointments are after yours.  Also, if you miss three or more appointments without notifying the office, you may be dismissed from the clinic at the providers discretion.      For prescription refill requests, have your pharmacy contact our office and allow 72 hours for refills to be completed.    Today you received the following chemotherapy and/or immunotherapy agents Pump stop      To help prevent nausea and vomiting after your treatment, we encourage you to take your nausea medication as directed.  BELOW ARE SYMPTOMS THAT SHOULD BE REPORTED IMMEDIATELY: *FEVER GREATER THAN 100.4 F (38 C) OR HIGHER *CHILLS OR SWEATING *NAUSEA AND VOMITING THAT IS NOT CONTROLLED WITH YOUR NAUSEA MEDICATION *UNUSUAL SHORTNESS OF BREATH *UNUSUAL BRUISING OR BLEEDING *URINARY PROBLEMS (pain or burning when urinating, or frequent urination) *BOWEL PROBLEMS (unusual diarrhea, constipation, pain near the anus) TENDERNESS IN MOUTH AND THROAT WITH OR WITHOUT PRESENCE OF ULCERS (sore throat, sores in mouth, or a toothache) UNUSUAL RASH, SWELLING OR PAIN  UNUSUAL VAGINAL DISCHARGE OR ITCHING   Items with * indicate a potential emergency and should be followed up as soon as possible or go to the Emergency Department if any problems should occur.  Please show the CHEMOTHERAPY ALERT CARD or IMMUNOTHERAPY ALERT CARD at check-in to the Emergency  Department and triage nurse.  Should you have questions after your visit or need to cancel or reschedule your appointment, please contact [facility] [phone]  and follow the prompts.  Office hours are 8:00 a.m. to 4:30 p.m. Monday - Friday. Please note that voicemails left after 4:00 p.m. may not be returned until the following business day.  We are closed weekends and major holidays. You have access to a nurse at all times for urgent questions. Please call the main number to the clinic [phone] and follow the prompts.  For any non-urgent questions, you may also contact your provider using MyChart. We now offer e-Visits for anyone 35 and older to request care online for non-urgent symptoms. For details visit [email or web address].   Also download the MyChart app! Go to the app store, search "MyChart", open the app, select [facility], and log in with your MyChart username and password.  Due to Covid, a mask is required upon entering the hospital/clinic. If you do not have a mask, one will be given to you upon arrival. For doctor visits, patients may have 1 support person aged 52 or older with them. For treatment visits, patients cannot have anyone with them due to current Covid guidelines and our immunocompromised population.

## 2022-02-09 NOTE — Progress Notes (Signed)
Patient presents today for 5FU pump stop and disconnection after 46 hour continous infusion.   5FU pump deaccessed.  Patients port flushed without difficulty.  Good blood return noted with no bruising or swelling noted at site.  needle removed intact.  Band aid applied.  VSS with discharge and left in satisfactory condition ambulatory with no s/s of distress noted.

## 2022-02-12 ENCOUNTER — Inpatient Hospital Stay (HOSPITAL_BASED_OUTPATIENT_CLINIC_OR_DEPARTMENT_OTHER): Payer: Medicare Other | Admitting: Hematology

## 2022-02-12 ENCOUNTER — Encounter (HOSPITAL_COMMUNITY): Payer: Self-pay | Admitting: *Deleted

## 2022-02-12 ENCOUNTER — Inpatient Hospital Stay (HOSPITAL_COMMUNITY): Payer: Medicare Other

## 2022-02-12 ENCOUNTER — Other Ambulatory Visit (HOSPITAL_COMMUNITY): Payer: Medicare Other

## 2022-02-12 ENCOUNTER — Other Ambulatory Visit: Payer: Self-pay

## 2022-02-12 VITALS — BP 155/73 | HR 72 | Temp 96.9°F | Resp 17 | Ht 71.0 in | Wt 167.7 lb

## 2022-02-12 DIAGNOSIS — Z5111 Encounter for antineoplastic chemotherapy: Secondary | ICD-10-CM | POA: Diagnosis not present

## 2022-02-12 DIAGNOSIS — E876 Hypokalemia: Secondary | ICD-10-CM

## 2022-02-12 DIAGNOSIS — Z95828 Presence of other vascular implants and grafts: Secondary | ICD-10-CM

## 2022-02-12 DIAGNOSIS — C210 Malignant neoplasm of anus, unspecified: Secondary | ICD-10-CM

## 2022-02-12 LAB — CBC WITH DIFFERENTIAL/PLATELET
Abs Immature Granulocytes: 0.01 K/uL (ref 0.00–0.07)
Basophils Absolute: 0 K/uL (ref 0.0–0.1)
Basophils Relative: 0 %
Eosinophils Absolute: 0.4 K/uL (ref 0.0–0.5)
Eosinophils Relative: 15 %
HCT: 27.2 % — ABNORMAL LOW (ref 39.0–52.0)
Hemoglobin: 8.8 g/dL — ABNORMAL LOW (ref 13.0–17.0)
Immature Granulocytes: 0 %
Lymphocytes Relative: 10 %
Lymphs Abs: 0.3 K/uL — ABNORMAL LOW (ref 0.7–4.0)
MCH: 29.1 pg (ref 26.0–34.0)
MCHC: 32.4 g/dL (ref 30.0–36.0)
MCV: 90.1 fL (ref 80.0–100.0)
Monocytes Absolute: 0.2 K/uL (ref 0.1–1.0)
Monocytes Relative: 9 %
Neutro Abs: 1.8 K/uL (ref 1.7–7.7)
Neutrophils Relative %: 66 %
Platelets: 111 K/uL — ABNORMAL LOW (ref 150–400)
RBC: 3.02 MIL/uL — ABNORMAL LOW (ref 4.22–5.81)
RDW: 13.2 % (ref 11.5–15.5)
WBC: 2.7 K/uL — ABNORMAL LOW (ref 4.0–10.5)
nRBC: 0 % (ref 0.0–0.2)

## 2022-02-12 LAB — COMPREHENSIVE METABOLIC PANEL
ALT: 13 U/L (ref 0–44)
AST: 21 U/L (ref 15–41)
Albumin: 2.8 g/dL — ABNORMAL LOW (ref 3.5–5.0)
Alkaline Phosphatase: 50 U/L (ref 38–126)
Anion gap: 7 (ref 5–15)
BUN: 70 mg/dL — ABNORMAL HIGH (ref 8–23)
CO2: 27 mmol/L (ref 22–32)
Calcium: 8 mg/dL — ABNORMAL LOW (ref 8.9–10.3)
Chloride: 103 mmol/L (ref 98–111)
Creatinine, Ser: 6.79 mg/dL — ABNORMAL HIGH (ref 0.61–1.24)
GFR, Estimated: 7 mL/min — ABNORMAL LOW (ref >60)
Glucose, Bld: 107 mg/dL — ABNORMAL HIGH (ref 70–99)
Potassium: 3.3 mmol/L — ABNORMAL LOW (ref 3.5–5.1)
Sodium: 137 mmol/L (ref 135–145)
Total Bilirubin: 0.5 mg/dL (ref 0.3–1.2)
Total Protein: 5.7 g/dL — ABNORMAL LOW (ref 6.5–8.1)

## 2022-02-12 LAB — MAGNESIUM: Magnesium: 1.6 mg/dL — ABNORMAL LOW (ref 1.7–2.4)

## 2022-02-12 MED ORDER — ONDANSETRON HCL 4 MG PO TABS
8.0000 mg | ORAL_TABLET | Freq: Once | ORAL | Status: AC
  Administered 2022-02-12: 8 mg via ORAL
  Filled 2022-02-12: qty 2

## 2022-02-12 MED ORDER — POTASSIUM CHLORIDE CRYS ER 20 MEQ PO TBCR
20.0000 meq | EXTENDED_RELEASE_TABLET | Freq: Once | ORAL | Status: AC
  Administered 2022-02-12: 20 meq via ORAL
  Filled 2022-02-12: qty 1

## 2022-02-12 MED ORDER — SODIUM CHLORIDE 0.9 % IV SOLN
225.0000 mg/m2/d | INTRAVENOUS | Status: DC
  Administered 2022-02-12: 1700 mg via INTRAVENOUS
  Filled 2022-02-12: qty 34

## 2022-02-12 MED ORDER — MAGNESIUM OXIDE -MG SUPPLEMENT 400 (240 MG) MG PO TABS
400.0000 mg | ORAL_TABLET | Freq: Once | ORAL | Status: AC
  Administered 2022-02-12: 400 mg via ORAL
  Filled 2022-02-12: qty 1

## 2022-02-12 MED ORDER — MAGNESIUM OXIDE -MG SUPPLEMENT 400 (240 MG) MG PO TABS: 400.0000 mg | ORAL_TABLET | Freq: Two times a day (BID) | ORAL | 3 refills | Status: AC

## 2022-02-12 NOTE — Progress Notes (Signed)
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, a 88 y.o. male, returns for routine follow-up and consideration for next cycle of chemotherapy. [name] was last seen on 02/05/2022.  Due for cycle #3 of fluorouracil today.   Overall, he tells me he has been feeling pretty well. He reports mild soft diarrhea occurring 2-3 times a day, and he denies nausea and vomiting. He is taking vitamin B-12. He denies recent fevers and infections. He denies mouth sores, ankle swellings, and skin rash.   Overall, he feels ready for next cycle of chemo today.    REVIEW OF SYSTEMS:  Review of Systems  Constitutional:  Negative for appetite change, fatigue and fever.  HENT:   Negative for mouth sores.   Cardiovascular:  Negative for leg swelling.  Gastrointestinal:  Positive for diarrhea.  Negative for nausea and vomiting.  Skin:  Negative for rash.  All other systems reviewed and are negative.  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   Chronic kidney disease    ckd stage 4, bilateral cysts on kidneys Jason Avila)   Dysrhythmia    RBBB   GERD (gastroesophageal reflux disease)    Gout    left foot   Heart murmur    no problems   History of blood transfusion 2018   History of radiation therapy 11/18/19- 12/16/19   Larynx 54 Gy in 20 fx   Hyperlipidemia    Hypertension    Jason Avila 01/05/2022   Subdural hematoma 11/2018   Wears glasses    Past Surgical History:  Procedure Laterality Date   BURR HOLE FOR SUBDURAL HEMATOMA  12/15/2018   brain   COLONOSCOPY     EYE SURGERY Bilateral    cataracts removed   MICROLARYNGOSCOPY N/A 10/23/2019   Procedure: Microlaryngoscopy with vocal cord lesion biopsy;  Surgeon: Jason Belfast, MD;  Location: [facility];  Service: ENT;  Laterality: N/A;   [facility] Right 01/15/2022  Procedure: INSERTION Jason RIJ;  Surgeon: Jason Aus, DO;  Location: [facility];  Service: General;  Laterality: Right;  pt can't come earlier d/t peritoneal dialysis   UPPER GI ENDOSCOPY      SOCIAL HISTORY:  Social History   Socioeconomic History   Marital status: Married    Spouse name: Not on file   Number of children: Not on file   Years of education: Not on file   Highest education level: Not on file  Occupational History   Not on file  Tobacco Use   Smoking status: Former    Packs/day: 0.25    Years: 40.00    Pack years: 10.00    Types: Cigarettes    Quit date: 12/22/1996    Years since quitting: 25.1   Smokeless tobacco:  Never   Tobacco comments:    Quit in 1998  Vaping Use   Vaping Use: Never used  Substance and Sexual Activity   Alcohol use: Never   Drug use: Never   Sexual activity: Yes  Other Topics Concern   Not on file  Social History Narrative   Not on file   Social Determinants of Health   Financial Resource Strain: Not on file  Food Insecurity: Not on file  Transportation Needs: Not on file  Physical Activity: Not on file  Stress: Not on file  Social Connections: Not on file  Intimate Partner Violence: Not on file    FAMILY HISTORY:  No family history on file.  CURRENT MEDICATIONS:  Current Outpatient Medications  Medication Sig Dispense Refill   atorvastatin (LIPITOR) 20 MG tablet Take 20 mg by mouth daily at 6 PM.      B12-ACTIVE 1 MG CHEW Chew 1 tablet by mouth daily.     calcium acetate (PHOSLO) 667 MG tablet Take 667 mg by mouth 3 (three) times daily.     Cholecalciferol 25 MCG (1000 UT) capsule Take 1,000 Units by mouth daily.     docusate sodium (COLACE) 100 MG capsule Take 100 mg by mouth 2 (two) times daily.     epoetin alfa (EPOGEN) 10000 UNIT/ML injection Inject 1 Units into the skin every 14 (fourteen) days.     fluorouracil CALGB 41962 2,400 mg/m2 in sodium chloride 0.9 % 150 mL Inject 7,650 mg into the vein over 96 hr. Day 1, Day 28     GNP IRON 200 (65 Fe) MG TABS SMARTSIG:1 Tablet(s) By Mouth     hydrALAZINE (APRESOLINE) 100 MG tablet Take 100 mg by mouth 3 (three) times daily.     levothyroxine (SYNTHROID) 25 MCG tablet Take 37.5 mcg by mouth every morning.     lidocaine-prilocaine (EMLA) cream Apply a small amount to port a cath site and cover with plastic wrap 1 hour prior to infusion appointments 30 g 3   mirtazapine (REMERON) 15 MG tablet Take 15 mg by mouth daily.     mitoMYcin (MUTAMYCIN) 20 MG chemo injection Inject into the vein once. Day 1 and Day 28     oxyCODONE (ROXICODONE) 5 MG immediate release tablet Take 1 tablet (5 mg total) by mouth every 8  (eight) hours as needed. 10 tablet 0   pantoprazole (PROTONIX) 40 MG tablet Take 40 mg by mouth every morning.     prochlorperazine (COMPAZINE) 10 MG tablet Take 1 tablet (10 mg total) by mouth every 6 (six) hours as needed (Nausea or vomiting). 30 tablet 1   sodium bicarbonate 650 MG tablet SMARTSIG:1 Tablet(s) By Mouth  sucroferric oxyhydroxide (VELPHORO) 500 MG chewable tablet Chew 500 tablets by mouth daily.     No current facility-administered medications for this visit.    ALLERGIES:  No Known Allergies  PHYSICAL EXAM:  Performance status (ECOG): 1 - Symptomatic but completely ambulatory  Vitals:   02/12/22 0940  BP: (!) 155/73  Pulse: 72  Resp: 17  Temp: (!) 96.9 F (36.1 C)  SpO2: 99%   Wt Readings from Last 3 Encounters:  02/12/22 167 lb 11.2 oz (76.1 kg)  02/05/22 166 lb 12.8 oz (75.7 kg)  01/29/22 163 lb 8 oz (74.2 kg)   Physical Exam Vitals reviewed.  Constitutional:      Appearance: Normal appearance.  Cardiovascular:     Rate and Rhythm: Normal rate and regular rhythm.     Pulses: Normal pulses.     Heart sounds: Normal heart sounds.  Pulmonary:     Effort: Pulmonary effort is normal.     Breath sounds: Normal breath sounds.  Neurological:     General: No focal deficit present.     Mental Status: He is alert and oriented to person, Avila, and time.  Psychiatric:        Mood and Affect: Mood normal.        Behavior: Behavior normal.    LABORATORY DATA:  I have reviewed the labs as listed.  CBC Latest Ref Rng & Units 02/05/2022 01/29/2022 01/15/2022  WBC 4.0 - 10.5 K/uL 2.4(L) 3.6(L) -  Hemoglobin 13.0 - 17.0 g/dL 9.5(L) 10.1(L) 13.6  Hematocrit 39.0 - 52.0 % 29.2(L) 32.4(L) 40.0  Platelets 150 - 400 K/uL 149(L) 205 -   CMP Latest Ref Rng & Units 02/05/2022 01/29/2022 01/15/2022  Glucose 70 - 99 mg/dL 104(H) 85 94  BUN 8 - 23 mg/dL 77(H) 70(H) 57(H)  Creatinine 0.61 - 1.24 mg/dL 7.29(H) 7.47(H) 7.50(H)  Sodium 135 - 145 mmol/L 139 140 141   Potassium 3.5 - 5.1 mmol/L 3.4(L) 3.5 3.9  Chloride 98 - 111 mmol/L 101 102 105  CO2 22 - 32 mmol/L 27 27 -  Calcium 8.9 - 10.3 mg/dL 8.3(L) 8.4(L) -  Total Protein 6.5 - 8.1 g/dL 5.6(L) 6.0(L) -  Total Bilirubin 0.3 - 1.2 mg/dL 0.6 0.5 -  Alkaline Phos 38 - 126 U/L 53 58 -  AST 15 - 41 U/L 22 20 -  ALT 0 - 44 U/L 13 11 -    DIAGNOSTIC IMAGING:  I have independently reviewed the scans and discussed with the patient. DG Chest Port 1 View  Result Date: 01/15/2022 CLINICAL DATA:  Placement of right IJ chest port EXAM: PORTABLE CHEST 1 VIEW COMPARISON:  None. FINDINGS: Transverse diameter of heart is increased. There are no signs of pulmonary edema or focal pulmonary consolidation. There is no pleural effusion or pneumothorax. Tip of right IJ central venous catheter is seen in the superior vena cava. IMPRESSION: Cardiomegaly. There are no signs of pulmonary edema or focal pulmonary consolidation. Tip of right IJ chest port is seen in the superior vena cava. There is no pneumothorax. Electronically Signed   By: Elmer Picker M.D.   On: 01/15/2022 15:02   DG C-Arm 1-60 Min-No Report  Result Date: 01/15/2022 Fluoroscopy was utilized by the requesting physician.  No radiographic interpretation.     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 - EGD and colonoscopy on 12/08/2021 with grossly normal EGD.  Colonoscopy  showed 2 cm mass with cratered ulcer with distal margin abutting the anal verge. 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 - Reports 30 pound weight loss unintentional in the last 6 months.  He was also started on hemodialysis in September 2022. - MRI rectal cancer protocol (at [facility] clinic) shows T1/T2.  No lymph nodes were seen.  Craniocaudal length of the tumor is 3.3 cm. - Rectal exam showed mass palpable in  the anal canal about 2 cm from the anal verge, predominantly on the right side. - XRT with 5-FU started on 01/29/2022.    Social/family history: - Lives at home with his wife.  He is a retired Glass blower/designer at [employer].  Quit smoking 24 years ago.  No chemical exposure.  No family history of malignancies.  3.  ESRD: - He is on peritoneal dialysis at home, started on 08/23/2021. - He is followed by Dr. Elita Quick in [place].  4.  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 - He did not have any nausea or vomiting.  However he started developing diarrhea, 2-3 stools per day, soft but not watery.  He reportedly had an accident yesterday. - Denies any mucositis.  No findings suggestive of HFSR. - Reviewed labs from today which shows normal LFTs.  Albumin is low at 2.8.  White count is 2.7 and platelet count 111.  ANC is normal.  Hemoglobin is 8.8.  He reportedly received ESA injection last week with his nephrologist. - Proceed with 5-FU pump today.  RTC 1 week for follow-up.  2.  B12 deficiency: - Continue B12 1 mg tablet daily.  3.  Hypomagnesemia: - Magnesium is 1.6 today.  We will start him on magnesium 400 once daily.   Orders placed this encounter:  No orders of the defined types were placed in this encounter.    Derek Jack, MD [place] [phone]   I, Thana Ates, am acting as a scribe for Dr. Derek Jack.  I, Derek Jack MD, have reviewed the above documentation for accuracy and completeness, and I agree with the above.

## 2022-02-12 NOTE — Patient Instructions (Signed)
[place] at [facility] Discharge Instructions   You were seen and examined today by Dr. Delton Coombes.  He reviewed the results of your lab work.  Your magnesium is low.  We will start you on a magnesium pill at home.  You can start out taking the pill once a day.  We will increase if needed.   We will proceed with your treatment today.  Return as scheduled.    Thank you for choosing [facility] at [facility] to provide your oncology and hematology care.  To afford each patient quality time with our provider, please arrive at least 15 minutes before your scheduled appointment time.   If you have a lab appointment with the [facility] please come in thru the Main Entrance and check in at the main information desk.  You need to re-schedule your appointment should you arrive 10 or more minutes late.  We strive to give you quality time with our providers, and arriving late affects you and other patients whose appointments are after yours.  Also, if you no show three or more times for appointments you may be dismissed from the clinic at the providers discretion.     Again, thank you for choosing [facility].  Our hope is that these requests will decrease the amount of time that you wait before being seen by our physicians.       _____________________________________________________________  Should you have questions after your visit to [facility], please contact our office at [phone] and follow the prompts.  Our office hours are 8:00 a.m. and 4:30 p.m. Monday - Friday.  Please note that voicemails left after 4:00 p.m. may not be returned until the following business day.  We are closed weekends and major holidays.  You do have access to a nurse 24-7, just call the main number to the clinic [phone] and do not press any options, hold on the line and a nurse will answer the phone.    For prescription refill  requests, have your pharmacy contact our office and allow 72 hours.    Due to Covid, you will need to wear a mask upon entering the hospital. If you do not have a mask, a mask will be given to you at the Main Entrance upon arrival. For doctor visits, patients may have 1 support person age 35 or older with them. For treatment visits, patients can not have anyone with them due to social distancing guidelines and our immunocompromised population.

## 2022-02-12 NOTE — Patient Instructions (Signed)
[facility]  Discharge Instructions: Thank you for choosing [facility] to provide your oncology and hematology care.  If you have a lab appointment with the [facility], please come in thru the Main Entrance and check in at the main information desk.  Wear comfortable clothing and clothing appropriate for easy access to any Portacath or PICC line.   We strive to give you quality time with your provider. You may need to reschedule your appointment if you arrive late (15 or more minutes).  Arriving late affects you and other patients whose appointments are after yours.  Also, if you miss three or more appointments without notifying the office, you may be dismissed from the clinic at the providers discretion.      For prescription refill requests, have your pharmacy contact our office and allow 72 hours for refills to be completed.    Today your 5FU pump was connected with no alarms noted. You also received 400mg  of Magnesium oxide and 20 mEq of Potassium Chloride PO, return as scheduled.   To help prevent nausea and vomiting after your treatment, we encourage you to take your nausea medication as directed.  BELOW ARE SYMPTOMS THAT SHOULD BE REPORTED IMMEDIATELY: *FEVER GREATER THAN 100.4 F (38 C) OR HIGHER *CHILLS OR SWEATING *NAUSEA AND VOMITING THAT IS NOT CONTROLLED WITH YOUR NAUSEA MEDICATION *UNUSUAL SHORTNESS OF BREATH *UNUSUAL BRUISING OR BLEEDING *URINARY PROBLEMS (pain or burning when urinating, or frequent urination) *BOWEL PROBLEMS (unusual diarrhea, constipation, pain near the anus) TENDERNESS IN MOUTH AND THROAT WITH OR WITHOUT PRESENCE OF ULCERS (sore throat, sores in mouth, or a toothache) UNUSUAL RASH, SWELLING OR PAIN  UNUSUAL VAGINAL DISCHARGE OR ITCHING   Items with * indicate a potential emergency and should be followed up as soon as possible or go to the Emergency Department if any problems should occur.  Please show the CHEMOTHERAPY  ALERT CARD or IMMUNOTHERAPY ALERT CARD at check-in to the Emergency Department and triage nurse.  Should you have questions after your visit or need to cancel or reschedule your appointment, please contact [facility] [phone]  and follow the prompts.  Office hours are 8:00 a.m. to 4:30 p.m. Monday - Friday. Please note that voicemails left after 4:00 p.m. may not be returned until the following business day.  We are closed weekends and major holidays. You have access to a nurse at all times for urgent questions. Please call the main number to the clinic [phone] and follow the prompts.  For any non-urgent questions, you may also contact your provider using MyChart. We now offer e-Visits for anyone 86 and older to request care online for non-urgent symptoms. For details visit [email or web address].   Also download the MyChart app! Go to the app store, search "MyChart", open the app, select [place], and log in with your MyChart username and password.  Due to Covid, a mask is required upon entering the hospital/clinic. If you do not have a mask, one will be given to you upon arrival. For doctor visits, patients may have 1 support person aged 86 or older with them. For treatment visits, patients cannot have anyone with them due to current Covid guidelines and our immunocompromised population.

## 2022-02-12 NOTE — Progress Notes (Signed)
Patients port flushed without difficulty.  Good blood return noted with no bruising or swelling noted at site.  Stable during access and blood draw.  Patient to remain accessed for treatment. 

## 2022-02-12 NOTE — Progress Notes (Signed)
Patient presents today for 5FU pump connection. Patient okay for treatment per Dr. Delton Coombes, with additional orders for 400mg  of Magnesium oxide and 20 mEq of Potassium chloride PO, d/t magnesium 1.6 and Potassium of 3.3. See MAR for details.  Port flushed with good blood return noted. No bruising or swelling at site. Patient's chemo pump connected with no alarms noted. Patient discharged in satisfactory condition. VVS stable with no signs or symptoms of distressed noted.

## 2022-02-12 NOTE — Progress Notes (Signed)
Patient has been examined by Dr. Katragadda, and vital signs and labs have been reviewed. ANC, Creatinine, LFTs, hemoglobin, and platelets are within treatment parameters per M.D. - pt may proceed with treatment.    °

## 2022-02-16 ENCOUNTER — Encounter (HOSPITAL_COMMUNITY): Payer: Self-pay

## 2022-02-16 ENCOUNTER — Inpatient Hospital Stay (HOSPITAL_COMMUNITY): Payer: Medicare Other | Attending: Hematology

## 2022-02-16 VITALS — BP 138/67 | HR 81 | Temp 97.7°F | Resp 18

## 2022-02-16 DIAGNOSIS — C211 Malignant neoplasm of anal canal: Secondary | ICD-10-CM | POA: Insufficient documentation

## 2022-02-16 DIAGNOSIS — Z5111 Encounter for antineoplastic chemotherapy: Secondary | ICD-10-CM | POA: Insufficient documentation

## 2022-02-16 DIAGNOSIS — N186 End stage renal disease: Secondary | ICD-10-CM | POA: Diagnosis not present

## 2022-02-16 DIAGNOSIS — Z95828 Presence of other vascular implants and grafts: Secondary | ICD-10-CM

## 2022-02-16 DIAGNOSIS — Z992 Dependence on renal dialysis: Secondary | ICD-10-CM | POA: Insufficient documentation

## 2022-02-16 DIAGNOSIS — D649 Anemia, unspecified: Secondary | ICD-10-CM | POA: Diagnosis not present

## 2022-02-16 DIAGNOSIS — Z87891 Personal history of nicotine dependence: Secondary | ICD-10-CM | POA: Insufficient documentation

## 2022-02-16 DIAGNOSIS — C210 Malignant neoplasm of anus, unspecified: Secondary | ICD-10-CM

## 2022-02-16 DIAGNOSIS — Z8521 Personal history of malignant neoplasm of larynx: Secondary | ICD-10-CM | POA: Insufficient documentation

## 2022-02-16 DIAGNOSIS — E538 Deficiency of other specified B group vitamins: Secondary | ICD-10-CM | POA: Diagnosis not present

## 2022-02-16 MED ORDER — HEPARIN SOD (PORK) LOCK FLUSH 100 UNIT/ML IV SOLN
500.0000 [IU] | Freq: Once | INTRAVENOUS | Status: AC | PRN
  Administered 2022-02-16: 500 [IU]

## 2022-02-16 MED ORDER — SODIUM CHLORIDE 0.9% FLUSH
10.0000 mL | INTRAVENOUS | Status: DC | PRN
  Administered 2022-02-16: 10 mL

## 2022-02-16 NOTE — Progress Notes (Signed)
Patient arrived today for pump d/c. Patient had 7.4 ML left to infuse according to pump. Per Arbie Cookey in pharmacy okay to disconnect pump at this time. Future pump rate should be set at 1.55ml per hour. Port flushed with good blood return noted. No bruising or swelling at site. Bandaid applied and patient discharged in satisfactory condition. VVS stable with no signs or symptoms of distressed noted.  ?

## 2022-02-16 NOTE — Patient Instructions (Signed)
[facility]  Discharge Instructions: ?Thank you for choosing [facility] to provide your oncology and hematology care.  ?If you have a lab appointment with the Cancer Center, please come in thru the Main Entrance and check in at the main information desk. ? ?Wear comfortable clothing and clothing appropriate for easy access to any Portacath or PICC line.  ? ?We strive to give you quality time with your provider. You may need to reschedule your appointment if you arrive late (15 or more minutes).  Arriving late affects you and other patients whose appointments are after yours.  Also, if you miss three or more appointments without notifying the office, you may be dismissed from the clinic at the provider?s discretion.    ?  ?For prescription refill requests, have your pharmacy contact our office and allow 72 hours for refills to be completed.   ? ?Today your pump was disconnected, return as scheduled. ?  ?To help prevent nausea and vomiting after your treatment, we encourage you to take your nausea medication as directed. ? ?BELOW ARE SYMPTOMS THAT SHOULD BE REPORTED IMMEDIATELY: ?*FEVER GREATER THAN 100.4 F (38 ?C) OR HIGHER ?*CHILLS OR SWEATING ?*NAUSEA AND VOMITING THAT IS NOT CONTROLLED WITH YOUR NAUSEA MEDICATION ?*UNUSUAL SHORTNESS OF BREATH ?*UNUSUAL BRUISING OR BLEEDING ?*URINARY PROBLEMS (pain or burning when urinating, or frequent urination) ?*BOWEL PROBLEMS (unusual diarrhea, constipation, pain near the anus) ?TENDERNESS IN MOUTH AND THROAT WITH OR WITHOUT PRESENCE OF ULCERS (sore throat, sores in mouth, or a toothache) ?UNUSUAL RASH, SWELLING OR PAIN  ?UNUSUAL VAGINAL DISCHARGE OR ITCHING  ? ?Items with * indicate a potential emergency and should be followed up as soon as possible or go to the Emergency Department if any problems should occur. ? ?Please show the CHEMOTHERAPY ALERT CARD or IMMUNOTHERAPY ALERT CARD at check-in to the Emergency Department and triage nurse. ? ?Should  you have questions after your visit or need to cancel or reschedule your appointment, please contact [facility] [phone]  and follow the prompts.  Office hours are 8:00 a.m. to 4:30 p.m. Monday - Friday. Please note that voicemails left after 4:00 p.m. may not be returned until the following business day.  We are closed weekends and major holidays. You have access to a nurse at all times for urgent questions. Please call the main number to the clinic [phone] and follow the prompts. ? ?For any non-urgent questions, you may also contact your provider using MyChart. We now offer e-Visits for anyone 18 and older to request care online for non-urgent symptoms. For details visit [email or web address]. ?  ?Also download the MyChart app! Go to the app store, search "MyChart", open the app, select [facility], and log in with your MyChart username and password. ? ?Due to Covid, a mask is required upon entering the hospital/clinic. If you do not have a mask, one will be given to you upon arrival. For doctor visits, patients may have 1 support person aged 18 or older with them. For treatment visits, patients cannot have anyone with them due to current Covid guidelines and our immunocompromised population.  ?

## 2022-02-19 ENCOUNTER — Encounter (HOSPITAL_COMMUNITY): Payer: Self-pay | Admitting: *Deleted

## 2022-02-19 ENCOUNTER — Inpatient Hospital Stay (HOSPITAL_COMMUNITY): Payer: Medicare Other

## 2022-02-19 ENCOUNTER — Other Ambulatory Visit: Payer: Self-pay

## 2022-02-19 ENCOUNTER — Inpatient Hospital Stay (HOSPITAL_BASED_OUTPATIENT_CLINIC_OR_DEPARTMENT_OTHER): Payer: Medicare Other | Admitting: Hematology

## 2022-02-19 VITALS — BP 162/73 | HR 75 | Temp 97.2°F | Resp 20 | Ht 71.0 in | Wt 171.1 lb

## 2022-02-19 DIAGNOSIS — Z95828 Presence of other vascular implants and grafts: Secondary | ICD-10-CM

## 2022-02-19 DIAGNOSIS — C210 Malignant neoplasm of anus, unspecified: Secondary | ICD-10-CM

## 2022-02-19 DIAGNOSIS — Z5111 Encounter for antineoplastic chemotherapy: Secondary | ICD-10-CM | POA: Diagnosis not present

## 2022-02-19 LAB — CBC WITH DIFFERENTIAL/PLATELET
Abs Immature Granulocytes: 0.03 10*3/uL (ref 0.00–0.07)
Basophils Absolute: 0 10*3/uL (ref 0.0–0.1)
Basophils Relative: 0 %
Eosinophils Absolute: 0.6 10*3/uL — ABNORMAL HIGH (ref 0.0–0.5)
Eosinophils Relative: 15 %
HCT: 25.9 % — ABNORMAL LOW (ref 39.0–52.0)
Hemoglobin: 8.2 g/dL — ABNORMAL LOW (ref 13.0–17.0)
Immature Granulocytes: 1 %
Lymphocytes Relative: 5 %
Lymphs Abs: 0.2 10*3/uL — ABNORMAL LOW (ref 0.7–4.0)
MCH: 28.4 pg (ref 26.0–34.0)
MCHC: 31.7 g/dL (ref 30.0–36.0)
MCV: 89.6 fL (ref 80.0–100.0)
Monocytes Absolute: 0.3 10*3/uL (ref 0.1–1.0)
Monocytes Relative: 8 %
Neutro Abs: 2.7 10*3/uL (ref 1.7–7.7)
Neutrophils Relative %: 71 %
Platelets: 120 10*3/uL — ABNORMAL LOW (ref 150–400)
RBC: 2.89 MIL/uL — ABNORMAL LOW (ref 4.22–5.81)
RDW: 13.8 % (ref 11.5–15.5)
WBC: 3.8 10*3/uL — ABNORMAL LOW (ref 4.0–10.5)
nRBC: 0 % (ref 0.0–0.2)

## 2022-02-19 LAB — COMPREHENSIVE METABOLIC PANEL
ALT: 12 U/L (ref 0–44)
AST: 19 U/L (ref 15–41)
Albumin: 2.7 g/dL — ABNORMAL LOW (ref 3.5–5.0)
Alkaline Phosphatase: 50 U/L (ref 38–126)
Anion gap: 8 (ref 5–15)
BUN: 71 mg/dL — ABNORMAL HIGH (ref 8–23)
CO2: 29 mmol/L (ref 22–32)
Calcium: 8 mg/dL — ABNORMAL LOW (ref 8.9–10.3)
Chloride: 103 mmol/L (ref 98–111)
Creatinine, Ser: 6.67 mg/dL — ABNORMAL HIGH (ref 0.61–1.24)
GFR, Estimated: 8 mL/min — ABNORMAL LOW (ref >60)
Glucose, Bld: 101 mg/dL — ABNORMAL HIGH (ref 70–99)
Potassium: 3.9 mmol/L (ref 3.5–5.1)
Sodium: 140 mmol/L (ref 135–145)
Total Bilirubin: 0.4 mg/dL (ref 0.3–1.2)
Total Protein: 5.6 g/dL — ABNORMAL LOW (ref 6.5–8.1)

## 2022-02-19 LAB — MAGNESIUM: Magnesium: 1.9 mg/dL (ref 1.7–2.4)

## 2022-02-19 MED ORDER — SODIUM CHLORIDE 0.9% FLUSH
10.0000 mL | INTRAVENOUS | Status: DC | PRN
  Administered 2022-02-19: 10 mL

## 2022-02-19 MED ORDER — SODIUM CHLORIDE 0.9 % IV SOLN
225.0000 mg/m2/d | INTRAVENOUS | Status: DC
  Administered 2022-02-19: 1700 mg via INTRAVENOUS
  Filled 2022-02-19: qty 34

## 2022-02-19 MED ORDER — ONDANSETRON HCL 4 MG PO TABS
8.0000 mg | ORAL_TABLET | Freq: Once | ORAL | Status: AC
  Administered 2022-02-19: 8 mg via ORAL
  Filled 2022-02-19: qty 2

## 2022-02-19 NOTE — Progress Notes (Signed)
Patient presents today for 5FU pump start. Okay to treat per Dr. Delton Coombes.  Port flushed with good blood return noted. No bruising or swelling at site. 5FU pump connected with no alarms noted, patient discharged in satisfactory condition. VVS stable with no signs or symptoms of distressed noted.  ?

## 2022-02-19 NOTE — Patient Instructions (Signed)
[facility] ?Discharge Instructions ? ? ?You were seen and examined today by Dr. Delton Coombes. ? ?He reviewed the results of your lab work which are normal/stable. ? ?We will proceed with your treatment today. ? ?Return as scheduled in 1 week.  ? ? ?Thank you for choosing [place] at [facility] to provide your oncology and hematology care.  To afford each patient quality time with our provider, please arrive at least 15 minutes before your scheduled appointment time.  ? ?If you have a lab appointment with the [facility] please come in thru the Main Entrance and check in at the main information desk. ? ?You need to re-schedule your appointment should you arrive 10 or more minutes late.  We strive to give you quality time with our providers, and arriving late affects you and other patients whose appointments are after yours.  Also, if you no show three or more times for appointments you may be dismissed from the clinic at the providers discretion.     ?Again, thank you for choosing [facility].  Our hope is that these requests will decrease the amount of time that you wait before being seen by our physicians.       ?_____________________________________________________________ ? ?Should you have questions after your visit to [facility], please contact our office at [phone] and follow the prompts.  Our office hours are 8:00 a.m. and 4:30 p.m. Monday - Friday.  Please note that voicemails left after 4:00 p.m. may not be returned until the following business day.  We are closed weekends and major holidays.  You do have access to a nurse 24-7, just call the main number to the clinic [phone] and do not press any options, hold on the line and a nurse will answer the phone.   ? ?For prescription refill requests, have your pharmacy contact our office and allow 72 hours.   ? ?Due to Covid, you will need to wear a mask upon  entering the hospital. If you do not have a mask, a mask will be given to you at the Main Entrance upon arrival. For doctor visits, patients may have 1 support person age 65 or older with them. For treatment visits, patients can not have anyone with them due to social distancing guidelines and our immunocompromised population.  ? ?   ?

## 2022-02-19 NOTE — Progress Notes (Signed)
Patient has been examined by Dr. Katragadda, and vital signs and labs have been reviewed. ANC, Creatinine, LFTs, hemoglobin, and platelets are within treatment parameters per M.D. - pt may proceed with treatment.    °

## 2022-02-19 NOTE — Patient Instructions (Signed)
[facility]  Discharge Instructions: ?Thank you for choosing [facility] to provide your oncology and hematology care.  ?If you have a lab appointment with the [facility], please come in thru the Main Entrance and check in at the main information desk. ? ?Wear comfortable clothing and clothing appropriate for easy access to any Portacath or PICC line.  ? ?We strive to give you quality time with your provider. You may need to reschedule your appointment if you arrive late (15 or more minutes).  Arriving late affects you and other patients whose appointments are after yours.  Also, if you miss three or more appointments without notifying the office, you may be dismissed from the clinic at the provider?s discretion.    ?  ?For prescription refill requests, have your pharmacy contact our office and allow 72 hours for refills to be completed.   ? ?Today you received the following chemotherapy and/or immunotherapy agents 5FU pump connection, return as scheduled. ?  ?To help prevent nausea and vomiting after your treatment, we encourage you to take your nausea medication as directed. ? ?BELOW ARE SYMPTOMS THAT SHOULD BE REPORTED IMMEDIATELY: ?*FEVER GREATER THAN 100.4 F (38 ?C) OR HIGHER ?*CHILLS OR SWEATING ?*NAUSEA AND VOMITING THAT IS NOT CONTROLLED WITH YOUR NAUSEA MEDICATION ?*UNUSUAL SHORTNESS OF BREATH ?*UNUSUAL BRUISING OR BLEEDING ?*URINARY PROBLEMS (pain or burning when urinating, or frequent urination) ?*BOWEL PROBLEMS (unusual diarrhea, constipation, pain near the anus) ?TENDERNESS IN MOUTH AND THROAT WITH OR WITHOUT PRESENCE OF ULCERS (sore throat, sores in mouth, or a toothache) ?UNUSUAL RASH, SWELLING OR PAIN  ?UNUSUAL VAGINAL DISCHARGE OR ITCHING  ? ?Items with * indicate a potential emergency and should be followed up as soon as possible or go to the Emergency Department if any problems should occur. ? ?Please show the CHEMOTHERAPY ALERT CARD or IMMUNOTHERAPY ALERT CARD at  check-in to the Emergency Department and triage nurse. ? ?Should you have questions after your visit or need to cancel or reschedule your appointment, please contact [facility] [phone]  and follow the prompts.  Office hours are 8:00 a.m. to 4:30 p.m. Monday - Friday. Please note that voicemails left after 4:00 p.m. may not be returned until the following business day.  We are closed weekends and major holidays. You have access to a nurse at all times for urgent questions. Please call the main number to the clinic [phone] and follow the prompts. ? ?For any non-urgent questions, you may also contact your provider using MyChart. We now offer e-Visits for anyone 64 and older to request care online for non-urgent symptoms. For details visit [email or web address]. ?  ?Also download the MyChart app! Go to the app store, search "MyChart", open the app, select [facility], and log in with your MyChart username and password. ? ?Due to Covid, a mask is required upon entering the hospital/clinic. If you do not have a mask, one will be given to you upon arrival. For doctor visits, patients may have 1 support person aged 86 or older with them. For treatment visits, patients cannot have anyone with them due to current Covid guidelines and our immunocompromised population.  ?

## 2022-02-19 NOTE — Progress Notes (Signed)
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 male, returns for routine follow-up and consideration for next cycle of chemotherapy. [name] was last seen on 02/12/2022.  Due for cycle #4 of fluorouracil today.   Overall, he tells me he has been feeling pretty well. He denies fatigue. He reports soft diarrhea occurring 2-4 times daily for which he is taking 1 Imodium tablet per loose stool. He reports irritation on his buttock. He denies incontinence, mouth sores, abdominal pain, nausea, and vomiting. He denies blisters or redness on his feet.  Overall, he feels ready for next cycle of chemo today.   REVIEW OF SYSTEMS:  Review of Systems  Constitutional:  Positive for appetite change. Negative for fatigue.  HENT:   Negative for mouth sores.   Gastrointestinal:   Positive for diarrhea. Negative for abdominal pain, nausea and vomiting.  Genitourinary:  Negative for bladder incontinence.   Musculoskeletal:  Positive for myalgias (3/10 buttocks).  All other systems reviewed and are negative.  PAST MEDICAL/SURGICAL HISTORY:  Past Medical History:  Diagnosis Date   Anemia    Aortic valve stenosis    mild AS 04/2017 (followed by Dr. 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   Chronic kidney disease    ckd stage 4, bilateral cysts on kidneys [facility])   Dysrhythmia    RBBB   GERD (gastroesophageal reflux disease)    Gout    left foot   Heart murmur    no problems   History of blood transfusion 2018   History of radiation therapy 11/18/19- 12/16/19   Larynx 54 Gy in 20 fx   Hyperlipidemia    Hypertension    Port-A-Cath in place 01/05/2022   Subdural hematoma 11/2018   Wears glasses    Past Surgical History:  Procedure Laterality Date   BURR HOLE FOR SUBDURAL HEMATOMA  12/15/2018   brain   COLONOSCOPY     EYE SURGERY Bilateral    cataracts removed   MICROLARYNGOSCOPY N/A 10/23/2019   Procedure: Microlaryngoscopy with vocal cord lesion biopsy;  Surgeon: Jerrell Belfast, MD;  Location: [facility];  Service: ENT;  Laterality: N/A;  PERITONEAL CATHETER INSERTION     PORTACATH PLACEMENT Right 01/15/2022   Procedure: INSERTION PORT-A-CATH RIJ;  Surgeon: Rusty Aus, DO;  Location: [facility];  Service: General;  Laterality: Right;  pt can't come earlier d/t peritoneal dialysis   UPPER GI ENDOSCOPY      SOCIAL HISTORY:  Social History   Socioeconomic History   Marital status: Married    Spouse name: Not on file   Number of children: Not on file   Years of education: Not on file   Highest education level: Not on file  Occupational History   Not on file  Tobacco Use   Smoking status: Former    Packs/day: 0.25    Years: 40.00     Pack years: 10.00    Types: Cigarettes    Quit date: 12/22/1996    Years since quitting: 25.1   Smokeless tobacco: Never   Tobacco comments:    Quit in 1998  Vaping Use   Vaping Use: Never used  Substance and Sexual Activity   Alcohol use: Never   Drug use: Never   Sexual activity: Yes  Other Topics Concern   Not on file  Social History Narrative   Not on file   Social Determinants of Health   Financial Resource Strain: Not on file  Food Insecurity: Not on file  Transportation Needs: Not on file  Physical Activity: Not on file  Stress: Not on file  Social Connections: Not on file  Intimate Partner Violence: Not on file    FAMILY HISTORY:  No family history on file.  CURRENT MEDICATIONS:  Current Outpatient Medications  Medication Sig Dispense Refill   atorvastatin (LIPITOR) 20 MG tablet Take 20 mg by mouth daily at 6 PM.      B12-ACTIVE 1 MG CHEW Chew 1 tablet by mouth daily.     calcium acetate (PHOSLO) 667 MG tablet Take 667 mg by mouth 3 (three) times daily.     Cholecalciferol 25 MCG (1000 UT) capsule Take 1,000 Units by mouth daily.     docusate sodium (COLACE) 100 MG capsule Take 100 mg by mouth 2 (two) times daily.     epoetin alfa (EPOGEN) 10000 UNIT/ML injection Inject 1 Units into the skin every 14 (fourteen) days.     fluorouracil CALGB 97989 2,400 mg/m2 in sodium chloride 0.9 % 150 mL Inject 7,650 mg into the vein over 96 hr. Day 1, Day 28     GNP IRON 200 (65 Fe) MG TABS SMARTSIG:1 Tablet(s) By Mouth     hydrALAZINE (APRESOLINE) 100 MG tablet Take 100 mg by mouth 3 (three) times daily.     levothyroxine (SYNTHROID) 25 MCG tablet Take 37.5 mcg by mouth every morning.     lidocaine-prilocaine (EMLA) cream Apply a small amount to port a cath site and cover with plastic wrap 1 hour prior to infusion appointments 30 g 3   magnesium oxide (MAG-OX) 400 (240 Mg) MG tablet Take 1 tablet (400 mg total) by mouth 2 (two) times daily. 60 tablet 3   mirtazapine  (REMERON) 15 MG tablet Take 15 mg by mouth daily.     mitoMYcin (MUTAMYCIN) 20 MG chemo injection Inject into the vein once. Day 1 and Day 28     oxyCODONE (ROXICODONE) 5 MG immediate release tablet Take 1 tablet (5 mg total) by mouth every 8 (eight) hours as needed. 10 tablet 0   pantoprazole (PROTONIX) 40 MG tablet Take 40 mg by mouth every morning.  prochlorperazine (COMPAZINE) 10 MG tablet Take 1 tablet (10 mg total) by mouth every 6 (six) hours as needed (Nausea or vomiting). 30 tablet 1   sodium bicarbonate 650 MG tablet SMARTSIG:1 Tablet(s) By Mouth     sucroferric oxyhydroxide (VELPHORO) 500 MG chewable tablet Chew 500 tablets by mouth daily.     No current facility-administered medications for this visit.    ALLERGIES:  No Known Allergies  PHYSICAL EXAM:  Performance status (ECOG): 1 - Symptomatic but completely ambulatory  Vitals:   02/19/22 0952  BP: (!) 162/73  Pulse: 75  Resp: 20  Temp: (!) 97.2 F (36.2 C)  SpO2: 100%   Wt Readings from Last 3 Encounters:  02/19/22 171 lb 1.6 oz (77.6 kg)  02/12/22 167 lb 11.2 oz (76.1 kg)  02/05/22 166 lb 12.8 oz (75.7 kg)   Physical Exam Vitals reviewed.  Constitutional:      Appearance: Normal appearance.  Cardiovascular:     Rate and Rhythm: Normal rate and regular rhythm.     Pulses: Normal pulses.     Heart sounds: Normal heart sounds.  Pulmonary:     Effort: Pulmonary effort is normal.     Breath sounds: Normal breath sounds.  Neurological:     General: No focal deficit present.     Mental Status: He is alert and oriented to person, place, and time.  Psychiatric:        Mood and Affect: Mood normal.        Behavior: Behavior normal.    LABORATORY DATA:  I have reviewed the labs as listed.  CBC Latest Ref Rng & Units 02/19/2022 02/12/2022 02/05/2022  WBC 4.0 - 10.5 K/uL 3.8(L) 2.7(L) 2.4(L)  Hemoglobin 13.0 - 17.0 g/dL 8.2(L) 8.8(L) 9.5(L)  Hematocrit 39.0 - 52.0 % 25.9(L) 27.2(L) 29.2(L)  Platelets 150 - 400  K/uL 120(L) 111(L) 149(L)   CMP Latest Ref Rng & Units 02/12/2022 02/05/2022 01/29/2022  Glucose 70 - 99 mg/dL 107(H) 104(H) 85  BUN 8 - 23 mg/dL 70(H) 77(H) 70(H)  Creatinine 0.61 - 1.24 mg/dL 6.79(H) 7.29(H) 7.47(H)  Sodium 135 - 145 mmol/L 137 139 140  Potassium 3.5 - 5.1 mmol/L 3.3(L) 3.4(L) 3.5  Chloride 98 - 111 mmol/L 103 101 102  CO2 22 - 32 mmol/L '27 27 27  '$ Calcium 8.9 - 10.3 mg/dL 8.0(L) 8.3(L) 8.4(L)  Total Protein 6.5 - 8.1 g/dL 5.7(L) 5.6(L) 6.0(L)  Total Bilirubin 0.3 - 1.2 mg/dL 0.5 0.6 0.5  Alkaline Phos 38 - 126 U/L 50 53 58  AST 15 - 41 U/L '21 22 20  '$ ALT 0 - 44 U/L '13 13 11    '$ DIAGNOSTIC IMAGING:  I have independently reviewed the scans and discussed with the patient. No results found.   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 - EGD and colonoscopy on 12/08/2021 with grossly normal EGD.  Colonoscopy showed 2 cm mass with cratered ulcer with distal margin abutting the anal verge. 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 - Reports 30 pound weight loss unintentional in the last 6 months.  He was also started on hemodialysis in September 2022. - MRI rectal cancer protocol (at [facility] clinic) shows T1/T2.  No lymph nodes were seen.  Craniocaudal length of the tumor is 3.3 cm. - Rectal exam showed mass palpable in the anal canal about 2 cm from the anal verge, predominantly  on the right side. - XRT with 5-FU started on 01/29/2022.    Social/family history: - Lives at home with his wife.  He is a retired Glass blower/designer at [employer].  Quit smoking 24 years ago.  No chemical exposure.  No family history of malignancies.  3.  ESRD: - He is on peritoneal dialysis at home, started on 08/23/2021. - He is followed by Dr. Elita Quick in [place].  4.  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 - He denies any nausea vomiting or constipation. - He has diarrhea, with soft stools ranging anywhere between 2-4 stools per day.  He is taking Imodium as needed, averaging 1/day.  Denies any major fatigue. - We have reviewed his labs which shows white count 3.8 and ANC was normal.  Platelet count is 120.  Hemoglobin 8.2.  LFTs are normal with low albumin 2.7. - Recommend proceeding with 5-FU infusion today through Friday.  RTC 1 week for follow-up.  2.  B12 deficiency: - Continue B12 1 mg tablet daily.  3.  Hypomagnesemia: - Magnesium is 1.9.  Continue magnesium 400 mg daily.  4.  Normocytic anemia: - Hemoglobin today is 8.8.  He gets ESA's at dialysis clinic.  Last DSA was 3 weeks ago.  We will fax his labs to the clinic so that he can receive another dose.   Orders placed this encounter:  No orders of the defined types were placed in this encounter.    Derek Jack, MD [facility] [phone]   I, Thana Ates, am acting as a scribe for Dr. Derek Jack.  I, Derek Jack MD, have reviewed the above documentation for accuracy and completeness, and I agree with the above.

## 2022-02-20 LAB — METHYLMALONIC ACID, SERUM: Methylmalonic Acid, Quantitative: 362 nmol/L (ref 0–378)

## 2022-02-23 ENCOUNTER — Inpatient Hospital Stay (HOSPITAL_COMMUNITY): Payer: Medicare Other

## 2022-02-23 ENCOUNTER — Encounter (HOSPITAL_COMMUNITY): Payer: Self-pay

## 2022-02-23 VITALS — BP 157/73 | HR 73 | Temp 98.0°F | Resp 18

## 2022-02-23 DIAGNOSIS — Z5111 Encounter for antineoplastic chemotherapy: Secondary | ICD-10-CM | POA: Diagnosis not present

## 2022-02-23 DIAGNOSIS — C210 Malignant neoplasm of anus, unspecified: Secondary | ICD-10-CM

## 2022-02-23 DIAGNOSIS — Z95828 Presence of other vascular implants and grafts: Secondary | ICD-10-CM

## 2022-02-23 MED ORDER — HEPARIN SOD (PORK) LOCK FLUSH 100 UNIT/ML IV SOLN
500.0000 [IU] | Freq: Once | INTRAVENOUS | Status: AC | PRN
  Administered 2022-02-23: 500 [IU]

## 2022-02-23 MED ORDER — SODIUM CHLORIDE 0.9% FLUSH
10.0000 mL | INTRAVENOUS | Status: DC | PRN
  Administered 2022-02-23: 10 mL

## 2022-02-23 NOTE — Progress Notes (Signed)
Patient presents today for pump d/c.  Port flushed with good blood return noted. No bruising or swelling at site. Bandaid applied and patient discharged in satisfactory condition. VVS stable with no signs or symptoms of distressed noted.  

## 2022-02-23 NOTE — Patient Instructions (Signed)
[facility]  Discharge Instructions: ?Thank you for choosing [facility] to provide your oncology and hematology care.  ?If you have a lab appointment with the [facility], please come in thru the Main Entrance and check in at the main information desk. ? ?Wear comfortable clothing and clothing appropriate for easy access to any Portacath or PICC line.  ? ?We strive to give you quality time with your provider. You may need to reschedule your appointment if you arrive late (15 or more minutes).  Arriving late affects you and other patients whose appointments are after yours.  Also, if you miss three or more appointments without notifying the office, you may be dismissed from the clinic at the provider?s discretion.    ?  ?For prescription refill requests, have your pharmacy contact our office and allow 72 hours for refills to be completed.   ? ?Today your pump was disconnected and flushed, return as scheduled. ?  ?To help prevent nausea and vomiting after your treatment, we encourage you to take your nausea medication as directed. ? ?BELOW ARE SYMPTOMS THAT SHOULD BE REPORTED IMMEDIATELY: ?*FEVER GREATER THAN 100.4 F (38 ?C) OR HIGHER ?*CHILLS OR SWEATING ?*NAUSEA AND VOMITING THAT IS NOT CONTROLLED WITH YOUR NAUSEA MEDICATION ?*UNUSUAL SHORTNESS OF BREATH ?*UNUSUAL BRUISING OR BLEEDING ?*URINARY PROBLEMS (pain or burning when urinating, or frequent urination) ?*BOWEL PROBLEMS (unusual diarrhea, constipation, pain near the anus) ?TENDERNESS IN MOUTH AND THROAT WITH OR WITHOUT PRESENCE OF ULCERS (sore throat, sores in mouth, or a toothache) ?UNUSUAL RASH, SWELLING OR PAIN  ?UNUSUAL VAGINAL DISCHARGE OR ITCHING  ? ?Items with * indicate a potential emergency and should be followed up as soon as possible or go to the Emergency Department if any problems should occur. ? ?Please show the CHEMOTHERAPY ALERT CARD or IMMUNOTHERAPY ALERT CARD at check-in to the Emergency Department and triage  nurse. ? ?Should you have questions after your visit or need to cancel or reschedule your appointment, please contact [facility] [phone]  and follow the prompts.  Office hours are 8:00 a.m. to 4:30 p.m. Monday - Friday. Please note that voicemails left after 4:00 p.m. may not be returned until the following business day.  We are closed weekends and major holidays. You have access to a nurse at all times for urgent questions. Please call the main number to the clinic [phone] and follow the prompts. ? ?For any non-urgent questions, you may also contact your provider using MyChart. We now offer e-Visits for anyone 37 and older to request care online for non-urgent symptoms. For details visit [email or web address]. ?  ?Also download the MyChart app! Go to the app store, search "MyChart", open the app, select [facility], and log in with your MyChart username and password. ? ?Due to Covid, a mask is required upon entering the hospital/clinic. If you do not have a mask, one will be given to you upon arrival. For doctor visits, patients may have 1 support person aged 72 or older with them. For treatment visits, patients cannot have anyone with them due to current Covid guidelines and our immunocompromised population.  ?

## 2022-02-26 ENCOUNTER — Inpatient Hospital Stay (HOSPITAL_BASED_OUTPATIENT_CLINIC_OR_DEPARTMENT_OTHER): Payer: Medicare Other | Admitting: Hematology

## 2022-02-26 ENCOUNTER — Inpatient Hospital Stay (HOSPITAL_COMMUNITY): Payer: Medicare Other

## 2022-02-26 ENCOUNTER — Encounter (HOSPITAL_COMMUNITY): Payer: Self-pay | Admitting: *Deleted

## 2022-02-26 ENCOUNTER — Other Ambulatory Visit: Payer: Self-pay

## 2022-02-26 VITALS — BP 164/69 | HR 86 | Temp 96.8°F | Resp 20 | Ht 71.06 in | Wt 174.4 lb

## 2022-02-26 DIAGNOSIS — Z5111 Encounter for antineoplastic chemotherapy: Secondary | ICD-10-CM | POA: Diagnosis not present

## 2022-02-26 DIAGNOSIS — C210 Malignant neoplasm of anus, unspecified: Secondary | ICD-10-CM

## 2022-02-26 DIAGNOSIS — Z95828 Presence of other vascular implants and grafts: Secondary | ICD-10-CM

## 2022-02-26 LAB — CBC WITH DIFFERENTIAL/PLATELET
Abs Immature Granulocytes: 0.03 10*3/uL (ref 0.00–0.07)
Basophils Absolute: 0 10*3/uL (ref 0.0–0.1)
Basophils Relative: 1 %
Eosinophils Absolute: 0.2 10*3/uL (ref 0.0–0.5)
Eosinophils Relative: 6 %
HCT: 26.4 % — ABNORMAL LOW (ref 39.0–52.0)
Hemoglobin: 8.4 g/dL — ABNORMAL LOW (ref 13.0–17.0)
Immature Granulocytes: 1 %
Lymphocytes Relative: 3 %
Lymphs Abs: 0.1 10*3/uL — ABNORMAL LOW (ref 0.7–4.0)
MCH: 29.6 pg (ref 26.0–34.0)
MCHC: 31.8 g/dL (ref 30.0–36.0)
MCV: 93 fL (ref 80.0–100.0)
Monocytes Absolute: 0.4 10*3/uL (ref 0.1–1.0)
Monocytes Relative: 9 %
Neutro Abs: 3.3 10*3/uL (ref 1.7–7.7)
Neutrophils Relative %: 80 %
Platelets: 155 10*3/uL (ref 150–400)
RBC: 2.84 MIL/uL — ABNORMAL LOW (ref 4.22–5.81)
RDW: 16 % — ABNORMAL HIGH (ref 11.5–15.5)
WBC: 4 10*3/uL (ref 4.0–10.5)
nRBC: 0.7 % — ABNORMAL HIGH (ref 0.0–0.2)

## 2022-02-26 LAB — COMPREHENSIVE METABOLIC PANEL
ALT: 10 U/L (ref 0–44)
AST: 20 U/L (ref 15–41)
Albumin: 2.6 g/dL — ABNORMAL LOW (ref 3.5–5.0)
Alkaline Phosphatase: 49 U/L (ref 38–126)
Anion gap: 9 (ref 5–15)
BUN: 61 mg/dL — ABNORMAL HIGH (ref 8–23)
CO2: 29 mmol/L (ref 22–32)
Calcium: 8 mg/dL — ABNORMAL LOW (ref 8.9–10.3)
Chloride: 101 mmol/L (ref 98–111)
Creatinine, Ser: 6.53 mg/dL — ABNORMAL HIGH (ref 0.61–1.24)
GFR, Estimated: 8 mL/min — ABNORMAL LOW (ref >60)
Glucose, Bld: 125 mg/dL — ABNORMAL HIGH (ref 70–99)
Potassium: 3.5 mmol/L (ref 3.5–5.1)
Sodium: 139 mmol/L (ref 135–145)
Total Bilirubin: 0.4 mg/dL (ref 0.3–1.2)
Total Protein: 5.5 g/dL — ABNORMAL LOW (ref 6.5–8.1)

## 2022-02-26 LAB — MAGNESIUM: Magnesium: 1.9 mg/dL (ref 1.7–2.4)

## 2022-02-26 MED ORDER — SODIUM CHLORIDE 0.9 % IV SOLN
225.0000 mg/m2/d | INTRAVENOUS | Status: DC
  Administered 2022-02-26: 1700 mg via INTRAVENOUS
  Filled 2022-02-26: qty 34

## 2022-02-26 MED ORDER — SODIUM CHLORIDE 0.9 % IV SOLN: Freq: Once | INTRAVENOUS | Status: AC

## 2022-02-26 MED ORDER — ONDANSETRON HCL 8 MG PO TABS: 8.0000 mg | ORAL_TABLET | Freq: Once | ORAL | Status: DC

## 2022-02-26 MED ORDER — ONDANSETRON HCL 4 MG PO TABS
8.0000 mg | ORAL_TABLET | Freq: Once | ORAL | Status: AC
  Administered 2022-02-26: 8 mg via ORAL
  Filled 2022-02-26: qty 2

## 2022-02-26 MED ORDER — SODIUM CHLORIDE 0.9% FLUSH
10.0000 mL | INTRAVENOUS | Status: DC | PRN
  Administered 2022-02-26: 10 mL

## 2022-02-26 MED ORDER — ONDANSETRON HCL 8 MG PO TABS
8.0000 mg | ORAL_TABLET | Freq: Once | ORAL | Status: DC
  Filled 2022-02-26: qty 1

## 2022-02-26 NOTE — Progress Notes (Signed)
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, a 88 y.o. male, returns for routine follow-up and consideration for next cycle of chemotherapy. Orla was last seen on 02/19/2022.  Due for cycle #5 of 5FU today.   Overall, he tells me he has been feeling pretty well. His diarrhea is stable, and he continues to have 2-3 soft BM daily. He continues to take Lomotil. He has gained 3 lbs since his last visit. He reports soreness on his buttock following radiation. He is due to complete radiation on 03/22. His appetite is poor. He drinks Boost daily.   Overall, he feels ready for next cycle of chemo today.   REVIEW OF SYSTEMS:  Review of Systems  Constitutional:  Positive for appetite change. Negative for fatigue and unexpected weight change (+3 lbs).  Gastrointestinal:  Negative  for diarrhea.  Musculoskeletal:  Positive for myalgias (6/10 buttock).  All other systems reviewed and are negative.  PAST MEDICAL/SURGICAL HISTORY:  Past Medical History:  Diagnosis Date   Anemia    Aortic valve stenosis    mild AS 04/2017 (followed by Dr. 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   Chronic kidney disease    ckd stage 4, bilateral cysts on kidneys [facility])   Dysrhythmia    RBBB   GERD (gastroesophageal reflux disease)    Gout    left foot   Heart murmur    no problems   History of blood transfusion 2018   History of radiation therapy 11/18/19- 12/16/19   Larynx 54 Gy in 20 fx   Hyperlipidemia    Hypertension    Port-A-Cath in place 01/05/2022   Subdural hematoma 11/2018   Wears glasses    Past Surgical History:  Procedure Laterality Date   BURR HOLE FOR SUBDURAL HEMATOMA  12/15/2018   brain   COLONOSCOPY     EYE SURGERY Bilateral    cataracts removed   MICROLARYNGOSCOPY N/A 10/23/2019   Procedure: Microlaryngoscopy with vocal cord lesion biopsy;  Surgeon: Jerrell Belfast, MD;  Location: [facility];  Service: ENT;  Laterality: N/A;   PERITONEAL CATHETER INSERTION     PORTACATH PLACEMENT Right  01/15/2022   Procedure: INSERTION PORT-A-CATH RIJ;  Surgeon: Rusty Aus, DO;  Location: [facility];  Service: General;  Laterality: Right;  pt can't come earlier d/t peritoneal dialysis   UPPER GI ENDOSCOPY      SOCIAL HISTORY:  Social History   Socioeconomic History   Marital status: Married    Spouse name: Not on file   Number of children: Not on file   Years of education: Not on file   Highest education level: Not on file  Occupational History   Not on file  Tobacco Use   Smoking status: Former    Packs/day: 0.25    Years: 40.00    Pack years: 10.00    Types: Cigarettes    Quit date: 12/22/1996    Years since quitting: 25.1   Smokeless  tobacco: Never   Tobacco comments:    Quit in 1998  Vaping Use   Vaping Use: Never used  Substance and Sexual Activity   Alcohol use: Never   Drug use: Never   Sexual activity: Yes  Other Topics Concern   Not on file  Social History Narrative   Not on file   Social Determinants of Health   Financial Resource Strain: Not on file  Food Insecurity: Not on file  Transportation Needs: Not on file  Physical Activity: Not on file  Stress: Not on file  Social Connections: Not on file  Intimate Partner Violence: Not on file    FAMILY HISTORY:  No family history on file.  CURRENT MEDICATIONS:  Current Outpatient Medications  Medication Sig Dispense Refill   atorvastatin (LIPITOR) 20 MG tablet Take 20 mg by mouth daily at 6 PM.      B12-ACTIVE 1 MG CHEW Chew 1 tablet by mouth daily.     calcium acetate (PHOSLO) 667 MG tablet Take 667 mg by mouth 3 (three) times daily.     Cholecalciferol 25 MCG (1000 UT) capsule Take 1,000 Units by mouth daily.     docusate sodium (COLACE) 100 MG capsule Take 100 mg by mouth 2 (two) times daily.     epoetin alfa (EPOGEN) 10000 UNIT/ML injection Inject 1 Units into the skin every 14 (fourteen) days.     fluorouracil CALGB 69629 2,400 mg/m2 in sodium chloride 0.9 % 150 mL Inject 7,650 mg into the vein over 96 hr. Day 1, Day 28     GNP IRON 200 (65 Fe) MG TABS SMARTSIG:1 Tablet(s) By Mouth     hydrALAZINE (APRESOLINE) 100 MG tablet Take 100 mg by mouth 3 (three) times daily.     levothyroxine (SYNTHROID) 25 MCG tablet Take 37.5 mcg by mouth every morning.     magnesium oxide (MAG-OX) 400 (240 Mg) MG tablet Take 1 tablet (400 mg total) by mouth 2 (two) times daily. 60 tablet 3   Methoxy PEG-Epoetin Beta (MIRCERA IJ) Inject into the skin.     mirtazapine (REMERON) 15 MG tablet Take 15 mg by mouth daily.     mitoMYcin (MUTAMYCIN) 20 MG chemo injection Inject into the vein once. Day 1 and Day 28     oxyCODONE (ROXICODONE) 5 MG immediate release tablet Take  1 tablet (5 mg total) by mouth every 8 (eight) hours as needed. 10 tablet 0   pantoprazole (PROTONIX) 40 MG tablet Take 40 mg by mouth every morning.     sodium bicarbonate 650 MG tablet SMARTSIG:1 Tablet(s) By Mouth     sucroferric oxyhydroxide (VELPHORO) 500 MG chewable tablet Chew 500 tablets by  mouth daily.     lidocaine-prilocaine (EMLA) cream Apply a small amount to port a cath site and cover with plastic wrap 1 hour prior to infusion appointments (Patient not taking: Reported on 02/26/2022) 30 g 3   prochlorperazine (COMPAZINE) 10 MG tablet Take 1 tablet (10 mg total) by mouth every 6 (six) hours as needed (Nausea or vomiting). (Patient not taking: Reported on 02/26/2022) 30 tablet 1   No current facility-administered medications for this visit.    ALLERGIES:  No Known Allergies  PHYSICAL EXAM:  Performance status (ECOG): 1 - Symptomatic but completely ambulatory  There were no vitals filed for this visit. Wt Readings from Last 3 Encounters:  02/26/22 174 lb 6.4 oz (79.1 kg)  02/19/22 171 lb 1.6 oz (77.6 kg)  02/12/22 167 lb 11.2 oz (76.1 kg)   Physical Exam Vitals reviewed.  Constitutional:      Appearance: Normal appearance.  Cardiovascular:     Rate and Rhythm: Normal rate and regular rhythm.     Pulses: Normal pulses.     Heart sounds: Normal heart sounds.  Pulmonary:     Effort: Pulmonary effort is normal.     Breath sounds: Normal breath sounds.  Neurological:     General: No focal deficit present.     Mental Status: He is alert and oriented to person, place, and time.  Psychiatric:        Mood and Affect: Mood normal.        Behavior: Behavior normal.    LABORATORY DATA:  I have reviewed the labs as listed.  CBC Latest Ref Rng & Units 02/26/2022 02/19/2022 02/12/2022  WBC 4.0 - 10.5 K/uL 4.0 3.8(L) 2.7(L)  Hemoglobin 13.0 - 17.0 g/dL 8.4(L) 8.2(L) 8.8(L)  Hematocrit 39.0 - 52.0 % 26.4(L) 25.9(L) 27.2(L)  Platelets 150 - 400 K/uL 155 120(L) 111(L)   CMP Latest  Ref Rng & Units 02/26/2022 02/19/2022 02/12/2022  Glucose 70 - 99 mg/dL 125(H) 101(H) 107(H)  BUN 8 - 23 mg/dL 61(H) 71(H) 70(H)  Creatinine 0.61 - 1.24 mg/dL 6.53(H) 6.67(H) 6.79(H)  Sodium 135 - 145 mmol/L 139 140 137  Potassium 3.5 - 5.1 mmol/L 3.5 3.9 3.3(L)  Chloride 98 - 111 mmol/L 101 103 103  CO2 22 - 32 mmol/L '29 29 27  '$ Calcium 8.9 - 10.3 mg/dL 8.0(L) 8.0(L) 8.0(L)  Total Protein 6.5 - 8.1 g/dL 5.5(L) 5.6(L) 5.7(L)  Total Bilirubin 0.3 - 1.2 mg/dL 0.4 0.4 0.5  Alkaline Phos 38 - 126 U/L 49 50 50  AST 15 - 41 U/L '20 19 21  '$ ALT 0 - 44 U/L '10 12 13    '$ DIAGNOSTIC IMAGING:  I have independently reviewed the scans and discussed with the patient. No results found.   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 - EGD and colonoscopy on 12/08/2021 with grossly normal EGD.  Colonoscopy showed 2 cm mass with cratered ulcer with distal margin abutting the anal verge. 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 - Reports 30 pound weight loss unintentional in the last 6 months.  He was also started on hemodialysis in September 2022. - MRI rectal cancer protocol (at [facility] clinic) shows T1/T2.  No lymph nodes were seen.  Craniocaudal length of the tumor is 3.3 cm. - Rectal exam showed mass palpable in the anal canal about 2 cm from the anal verge, predominantly on the right  side. - XRT with 5-FU started on 01/29/2022.    Social/family history: - Lives at home with his wife.  He is a retired Glass blower/designer at [employer].  Quit smoking 24 years ago.  No chemical exposure.  No family history of malignancies.  3.  ESRD: - He is on peritoneal dialysis at home, started on 08/23/2021. - He is followed by Dr. Elita Quick in [place].  4.  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  He has diarrhea with soft stools ranging from 2-4 stools per day. - Continue Imodium as needed. - Reports some soreness in the rectal area which is expected during this time of treatment. - Reviewed labs today which showed normal LFTs.  CBC shows white count and ANC was normal.  Platelet count was normal. - Proceed with treatment today.  RTC 1 week for follow-up.  He will finish XRT around 03/07/2022.  We plan to give him treatment next week.  2.  B12 deficiency: - Continue B12 1 mg tablet daily.  3.  Hypomagnesemia: - Continue magnesium daily.  Magnesium is normal.  4.  Normocytic anemia: - Hemoglobin today is 8.4.  He received ESA last week. - We will fax his labs to the clinic to get another dose.   Orders placed this encounter:  No orders of the defined types were placed in this encounter.    Derek Jack, MD [place] [phone]   I, Thana Ates, am acting as a scribe for Dr. Derek Jack.  I, Derek Jack MD, have reviewed the above documentation for accuracy and completeness, and I agree with the above.

## 2022-02-26 NOTE — Patient Instructions (Signed)
[place]  Discharge Instructions: ?Thank you for choosing [facility] to provide your oncology and hematology care.  ?If you have a lab appointment with the [facility], please come in thru the Main Entrance and check in at the main information desk. ? ?Wear comfortable clothing and clothing appropriate for easy access to any Portacath or PICC line.  ? ?We strive to give you quality time with your provider. You may need to reschedule your appointment if you arrive late (15 or more minutes).  Arriving late affects you and other patients whose appointments are after yours.  Also, if you miss three or more appointments without notifying the office, you may be dismissed from the clinic at the provider?s discretion.    ?  ?For prescription refill requests, have your pharmacy contact our office and allow 72 hours for refills to be completed.   ? ?Today you received the following chemotherapy and/or immunotherapy agents adruicil.  ?  ?To help prevent nausea and vomiting after your treatment, we encourage you to take your nausea medication as directed. ? ?BELOW ARE SYMPTOMS THAT SHOULD BE REPORTED IMMEDIATELY: ?*FEVER GREATER THAN 100.4 F (38 ?C) OR HIGHER ?*CHILLS OR SWEATING ?*NAUSEA AND VOMITING THAT IS NOT CONTROLLED WITH YOUR NAUSEA MEDICATION ?*UNUSUAL SHORTNESS OF BREATH ?*UNUSUAL BRUISING OR BLEEDING ?*URINARY PROBLEMS (pain or burning when urinating, or frequent urination) ?*BOWEL PROBLEMS (unusual diarrhea, constipation, pain near the anus) ?TENDERNESS IN MOUTH AND THROAT WITH OR WITHOUT PRESENCE OF ULCERS (sore throat, sores in mouth, or a toothache) ?UNUSUAL RASH, SWELLING OR PAIN  ?UNUSUAL VAGINAL DISCHARGE OR ITCHING  ? ?Items with * indicate a potential emergency and should be followed up as soon as possible or go to the Emergency Department if any problems should occur. ? ?Please show the CHEMOTHERAPY ALERT CARD or IMMUNOTHERAPY ALERT CARD at check-in to the Emergency  Department and triage nurse. ? ?Should you have questions after your visit or need to cancel or reschedule your appointment, please contact [facility] [phone]  and follow the prompts.  Office hours are 8:00 a.m. to 4:30 p.m. Monday - Friday. Please note that voicemails left after 4:00 p.m. may not be returned until the following business day.  We are closed weekends and major holidays. You have access to a nurse at all times for urgent questions. Please call the main number to the clinic [phone] and follow the prompts. ? ?For any non-urgent questions, you may also contact your provider using MyChart. We now offer e-Visits for anyone 86 and older to request care online for non-urgent symptoms. For details visit [email or web address]. ?  ?Also download the MyChart app! Go to the app store, search "MyChart", open the app, select [facility], and log in with your MyChart username and password. ? ?Due to Covid, a mask is required upon entering the hospital/clinic. If you do not have a mask, one will be given to you upon arrival. For doctor visits, patients may have 1 support person aged 21 or older with them. For treatment visits, patients cannot have anyone with them due to current Covid guidelines and our immunocompromised population.  ?

## 2022-02-26 NOTE — Progress Notes (Signed)
Patient has been examined by Dr. Katragadda, and vital signs and labs have been reviewed. ANC, Creatinine, LFTs, hemoglobin, and platelets are within treatment parameters per M.D. - pt may proceed with treatment.    °

## 2022-02-26 NOTE — Patient Instructions (Signed)
[place] at [facility] ?Discharge Instructions ? ? ?You were seen and examined today by Dr. Delton Coombes. ? ?He reviewed your lab work which is normal/stable. ? ?We will proceed with your treatment today and next week. ? ?Return as scheduled in 1 week.  ? ? ?Thank you for choosing [place] at [facility] to provide your oncology and hematology care.  To afford each patient quality time with our provider, please arrive at least 15 minutes before your scheduled appointment time.  ? ?If you have a lab appointment with the [facility] please come in thru the Main Entrance and check in at the main information desk. ? ?You need to re-schedule your appointment should you arrive 10 or more minutes late.  We strive to give you quality time with our providers, and arriving late affects you and other patients whose appointments are after yours.  Also, if you no show three or more times for appointments you may be dismissed from the clinic at the providers discretion.     ?Again, thank you for choosing  [facility].  Our hope is that these requests will decrease the amount of time that you wait before being seen by our physicians.       ?_____________________________________________________________ ? ?Should you have questions after your visit to [facility], please contact our office at [phone] and follow the prompts.  Our office hours are 8:00 a.m. and 4:30 p.m. Monday - Friday.  Please note that voicemails left after 4:00 p.m. may not be returned until the following business day.  We are closed weekends and major holidays.  You do have access to a nurse 24-7, just call the main number to the clinic [phone] and do not press any options, hold on the line and a nurse will answer the phone.   ? ?For prescription refill requests, have your pharmacy contact our office and allow 72 hours.   ? ?Due to Covid, you will need to wear a mask upon  entering the hospital. If you do not have a mask, a mask will be given to you at the Main Entrance upon arrival. For doctor visits, patients may have 1 support person age 72 or older with them. For treatment visits, patients can not have anyone with them due to social distancing guidelines and our immunocompromised population.  ? ?   ?

## 2022-02-26 NOTE — Progress Notes (Signed)
Patients port flushed without difficulty.  Good blood return noted with no bruising or swelling noted at site.  Stable during access and blood draw.  Patient to remain accessed for treatment. 

## 2022-02-26 NOTE — Progress Notes (Signed)
Patient presents today for 5FU pump start. Okay to treat per Dr. Delton Coombes.  Port flushed with good blood return noted. No bruising or swelling at site. 5FU pump connected with no alarms noted, patient discharged in satisfactory condition. VVS stable with no signs or symptoms of distressed noted. ith no complaints voiced.   ?

## 2022-03-02 ENCOUNTER — Inpatient Hospital Stay (HOSPITAL_COMMUNITY): Payer: Medicare Other

## 2022-03-02 VITALS — BP 143/63 | HR 73 | Temp 97.4°F | Resp 18

## 2022-03-02 DIAGNOSIS — Z5111 Encounter for antineoplastic chemotherapy: Secondary | ICD-10-CM | POA: Diagnosis not present

## 2022-03-02 DIAGNOSIS — Z95828 Presence of other vascular implants and grafts: Secondary | ICD-10-CM

## 2022-03-02 DIAGNOSIS — C210 Malignant neoplasm of anus, unspecified: Secondary | ICD-10-CM

## 2022-03-02 MED ORDER — SODIUM CHLORIDE 0.9% FLUSH
10.0000 mL | INTRAVENOUS | Status: DC | PRN
  Administered 2022-03-02: 10 mL

## 2022-03-02 MED ORDER — HEPARIN SOD (PORK) LOCK FLUSH 100 UNIT/ML IV SOLN
500.0000 [IU] | Freq: Once | INTRAVENOUS | Status: AC | PRN
  Administered 2022-03-02: 500 [IU]

## 2022-03-02 NOTE — Progress Notes (Signed)
Jason Avila presents to have home infusion pump d/c'd and for port-a-cath deaccess with flush.  Portacath located right chest wall accessed with  H 20 needle.  Good blood return present. Portacath flushed with NS and 500U/10m Heparin, and needle removed intact.  Procedure tolerated well and without incident.  ?

## 2022-03-05 ENCOUNTER — Other Ambulatory Visit: Payer: Self-pay

## 2022-03-05 ENCOUNTER — Inpatient Hospital Stay (HOSPITAL_COMMUNITY): Payer: Medicare Other

## 2022-03-05 ENCOUNTER — Inpatient Hospital Stay (HOSPITAL_BASED_OUTPATIENT_CLINIC_OR_DEPARTMENT_OTHER): Payer: Medicare Other | Admitting: Hematology

## 2022-03-05 ENCOUNTER — Encounter (HOSPITAL_COMMUNITY): Payer: Self-pay | Admitting: *Deleted

## 2022-03-05 ENCOUNTER — Encounter (HOSPITAL_COMMUNITY): Payer: Self-pay | Admitting: Hematology

## 2022-03-05 DIAGNOSIS — D509 Iron deficiency anemia, unspecified: Secondary | ICD-10-CM | POA: Diagnosis not present

## 2022-03-05 DIAGNOSIS — C210 Malignant neoplasm of anus, unspecified: Secondary | ICD-10-CM

## 2022-03-05 DIAGNOSIS — Z95828 Presence of other vascular implants and grafts: Secondary | ICD-10-CM

## 2022-03-05 DIAGNOSIS — D518 Other vitamin B12 deficiency anemias: Secondary | ICD-10-CM

## 2022-03-05 DIAGNOSIS — Z5111 Encounter for antineoplastic chemotherapy: Secondary | ICD-10-CM | POA: Diagnosis not present

## 2022-03-05 LAB — CBC WITH DIFFERENTIAL/PLATELET
Abs Immature Granulocytes: 0.01 10*3/uL (ref 0.00–0.07)
Basophils Absolute: 0 10*3/uL (ref 0.0–0.1)
Basophils Relative: 0 %
Eosinophils Absolute: 0.2 10*3/uL (ref 0.0–0.5)
Eosinophils Relative: 5 %
HCT: 27.1 % — ABNORMAL LOW (ref 39.0–52.0)
Hemoglobin: 8.5 g/dL — ABNORMAL LOW (ref 13.0–17.0)
Immature Granulocytes: 0 %
Lymphocytes Relative: 2 %
Lymphs Abs: 0.1 10*3/uL — ABNORMAL LOW (ref 0.7–4.0)
MCH: 29.3 pg (ref 26.0–34.0)
MCHC: 31.4 g/dL (ref 30.0–36.0)
MCV: 93.4 fL (ref 80.0–100.0)
Monocytes Absolute: 0.4 10*3/uL (ref 0.1–1.0)
Monocytes Relative: 10 %
Neutro Abs: 3.1 10*3/uL (ref 1.7–7.7)
Neutrophils Relative %: 83 %
Platelets: 181 10*3/uL (ref 150–400)
RBC: 2.9 MIL/uL — ABNORMAL LOW (ref 4.22–5.81)
RDW: 18.1 % — ABNORMAL HIGH (ref 11.5–15.5)
WBC: 3.8 10*3/uL — ABNORMAL LOW (ref 4.0–10.5)
nRBC: 0 % (ref 0.0–0.2)

## 2022-03-05 LAB — COMPREHENSIVE METABOLIC PANEL
ALT: 10 U/L (ref 0–44)
AST: 19 U/L (ref 15–41)
Albumin: 2.3 g/dL — ABNORMAL LOW (ref 3.5–5.0)
Alkaline Phosphatase: 45 U/L (ref 38–126)
Anion gap: 10 (ref 5–15)
BUN: 72 mg/dL — ABNORMAL HIGH (ref 8–23)
CO2: 29 mmol/L (ref 22–32)
Calcium: 7.6 mg/dL — ABNORMAL LOW (ref 8.9–10.3)
Chloride: 99 mmol/L (ref 98–111)
Creatinine, Ser: 6.61 mg/dL — ABNORMAL HIGH (ref 0.61–1.24)
GFR, Estimated: 8 mL/min — ABNORMAL LOW (ref >60)
Glucose, Bld: 132 mg/dL — ABNORMAL HIGH (ref 70–99)
Potassium: 4.1 mmol/L (ref 3.5–5.1)
Sodium: 138 mmol/L (ref 135–145)
Total Bilirubin: 0.5 mg/dL (ref 0.3–1.2)
Total Protein: 5.2 g/dL — ABNORMAL LOW (ref 6.5–8.1)

## 2022-03-05 LAB — MAGNESIUM: Magnesium: 1.9 mg/dL (ref 1.7–2.4)

## 2022-03-05 MED ORDER — HEPARIN SOD (PORK) LOCK FLUSH 100 UNIT/ML IV SOLN: 500.0000 [IU] | Freq: Once | INTRAVENOUS | Status: DC | PRN

## 2022-03-05 MED ORDER — SODIUM CHLORIDE 0.9 % IV SOLN
225.0000 mg/m2/d | INTRAVENOUS | Status: DC
  Administered 2022-03-05: 1700 mg via INTRAVENOUS
  Filled 2022-03-05: qty 34

## 2022-03-05 MED ORDER — SODIUM CHLORIDE 0.9% FLUSH: 10.0000 mL | INTRAVENOUS | Status: DC | PRN

## 2022-03-05 MED ORDER — SODIUM CHLORIDE 0.9 % IV SOLN: Freq: Once | INTRAVENOUS | Status: DC

## 2022-03-05 MED ORDER — ONDANSETRON HCL 4 MG PO TABS
8.0000 mg | ORAL_TABLET | Freq: Once | ORAL | Status: AC
  Administered 2022-03-05: 8 mg via ORAL
  Filled 2022-03-05: qty 1

## 2022-03-05 MED ORDER — DICYCLOMINE HCL 10 MG PO CAPS: 10.0000 mg | ORAL_CAPSULE | Freq: Three times a day (TID) | ORAL | 1 refills | Status: AC

## 2022-03-05 NOTE — Progress Notes (Signed)
Patient has been examined by Dr. Katragadda, and vital signs and labs have been reviewed. ANC, Creatinine, LFTs, hemoglobin, and platelets are within treatment parameters per M.D. - pt may proceed with treatment.    °

## 2022-03-05 NOTE — Patient Instructions (Addendum)
[place] at [facility] ?Discharge Instructions ? ? ?You were seen and examined today by Dr. Delton Coombes. ? ?He reviewed the results of your lab work which are normal/stable.  ? ?You can hold off on your ? ?We will proceed with your final treatment today.  ? ?Return as scheduled.  ? ? ?Thank you for choosing [place] at [facility] to provide your oncology and hematology care.  To afford each patient quality time with our provider, please arrive at least 15 minutes before your scheduled appointment time.  ? ?If you have a lab appointment with the [facility] please come in thru the Main Entrance and check in at the main information desk. ? ?You need to re-schedule your appointment should you arrive 10 or more minutes late.  We strive to give you quality time with our providers, and arriving late affects you and other patients whose appointments are after yours.  Also, if you no show three or more times for appointments you may be dismissed from the clinic at the providers discretion.     ?Again, thank you for choosing [facility].  Our hope is that these requests will decrease the amount of time that you wait before being seen by our physicians.       ?_____________________________________________________________ ? ?Should you have questions after your visit to [facility], please contact our office at [phone] and follow the prompts.  Our office hours are 8:00 a.m. and 4:30 p.m. Monday - Friday.  Please note that voicemails left after 4:00 p.m. may not be returned until the following business day.  We are closed weekends and major holidays.  You do have access to a nurse 24-7, just call the main number to the clinic [phone] and do not press any options, hold on the line and a nurse will answer the phone.   ? ?For prescription refill requests, have your pharmacy contact our office and allow 72 hours.   ? ?Due to Covid, you will  need to wear a mask upon entering the hospital. If you do not have a mask, a mask will be given to you at the Main Entrance upon arrival. For doctor visits, patients may have 1 support person age 32 or older with them. For treatment visits, patients can not have anyone with them due to social distancing guidelines and our immunocompromised population.  ? ?   ?

## 2022-03-05 NOTE — Patient Instructions (Signed)
Jason Avila  Discharge Instructions: ?Thank you for choosing [facility] to provide your oncology and hematology care.  ?If you have a lab appointment with the [facility], please come in thru the Main Entrance and check in at the main information desk. ? ?Wear comfortable clothing and clothing appropriate for easy access to any Portacath or PICC line.  ? ?We strive to give you quality time with your provider. You may need to reschedule your appointment if you arrive late (15 or more minutes).  Arriving late affects you and other patients whose appointments are after yours.  Also, if you miss three or more appointments without notifying the office, you may be dismissed from the clinic at the provider?s discretion.    ?  ?For prescription refill requests, have your pharmacy contact our office and allow 72 hours for refills to be completed.   ? ?Today you received the following chemotherapy and/or immunotherapy agents adruicil ?  ?To help prevent nausea and vomiting after your treatment, we encourage you to take your nausea medication as directed. ? ?BELOW ARE SYMPTOMS THAT SHOULD BE REPORTED IMMEDIATELY: ?*FEVER GREATER THAN 100.4 F (38 ?C) OR HIGHER ?*CHILLS OR SWEATING ?*NAUSEA AND VOMITING THAT IS NOT CONTROLLED WITH YOUR NAUSEA MEDICATION ?*UNUSUAL SHORTNESS OF BREATH ?*UNUSUAL BRUISING OR BLEEDING ?*URINARY PROBLEMS (pain or burning when urinating, or frequent urination) ?*BOWEL PROBLEMS (unusual diarrhea, constipation, pain near the anus) ?TENDERNESS IN MOUTH AND THROAT WITH OR WITHOUT PRESENCE OF ULCERS (sore throat, sores in mouth, or a toothache) ?UNUSUAL RASH, SWELLING OR PAIN  ?UNUSUAL VAGINAL DISCHARGE OR ITCHING  ? ?Items with * indicate a potential emergency and should be followed up as soon as possible or go to the Emergency Department if any problems should occur. ? ?Please show the CHEMOTHERAPY ALERT CARD or IMMUNOTHERAPY ALERT CARD at check-in to the Emergency Department  and triage nurse. ? ?Should you have questions after your visit or need to cancel or reschedule your appointment, please contact [facility] [phone]  and follow the prompts.  Office hours are 8:00 a.m. to 4:30 p.m. Monday - Friday. Please note that voicemails left after 4:00 p.m. may not be returned until the following business day.  We are closed weekends and major holidays. You have access to a nurse at all times for urgent questions. Please call the main number to the clinic [phone] and follow the prompts. ? ?For any non-urgent questions, you may also contact your provider using MyChart. We now offer e-Visits for anyone 55 and older to request care online for non-urgent symptoms. For details visit [email or web address]. ?  ?Also download the MyChart app! Go to the app store, search "MyChart", open the app, select [facility], and log in with your MyChart username and password. ? ?Due to Covid, a mask is required upon entering the hospital/clinic. If you do not have a mask, one will be given to you upon arrival. For doctor visits, patients may have 1 support person aged 27 or older with them. For treatment visits, patients cannot have anyone with them due to current Covid guidelines and our immunocompromised population.  ?

## 2022-03-05 NOTE — Progress Notes (Signed)
Patient presents today for 5FU pump start. Okay to treat per Dr. Delton Coombes.  Port flushed with good blood return noted. No bruising or swelling at site. 5FU pump connected with no alarms noted, patient discharged in satisfactory condition. VVS stable with no signs or symptoms of distressed noted. ith no complaints voiced.   ?

## 2022-03-05 NOTE — Progress Notes (Signed)
? ?[facility] ?[address] ?[place], [place] [address] ? ? ?CLINIC:  ?Medical Oncology/Hematology ? ?PCP:  ?Pomposini, Cherly Anderson, MD ?No address on file ?None ? ? ?REASON FOR VISIT:  ?Follow-up for rectal cancer ? ?PRIOR THERAPY: none ? ?NGS Results: not done ? ?CURRENT THERAPY: Mitomycin D1,28 / 5FU D1-4, 28-31 q32d ? 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 Staging form: Larynx - Glottis, AJCC 8th Edition ?- Clinical stage from 11/09/2019: Stage 0 (cTis, cN0, cM0) - Signed by Eppie Gibson, MD on 11/10/2019 ? ?  ?11/10/2019 Initial Diagnosis  ? 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  ?Anal cancer ([place])  ?12/19/2021 Initial Diagnosis  ? Anal cancer [facility]) ?  ?01/29/2022 -  Chemotherapy  ? Patient is on Treatment Plan : ANUS 5FU weekly pump   ?   ? ? ?CANCER STAGING: ? Cancer Staging  ?Anal cancer ([place]) ?Staging form: Anus, AJCC V9 ?- Clinical stage from 12/20/2021: Stage IIA (cT2, cN0, cM0) - Unsigned ? 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 ? ?INTERVAL HISTORY:  ?[name], a 88 y.o. male, returns for routine follow-up and consideration for next cycle of chemotherapy. Jason Avila was last seen on 02/26/2022. ? ?Due for cycle #6 of 5FU today.  ? ?Overall, he tells me he has been feeling pretty well. He reports intermittent abdominal cramping starting on 03/17. He reports 1 BM daily, and he reports black stools since he has been taking iron tablets.  He is due to complete radiation on Wednesday. He reports rectal pain. His appetite is poor. He denies nausea and vomiting. He denies mouth sores.  ? ?Overall, he feels ready for next cycle of chemo today.  ? ?REVIEW OF SYSTEMS:  ?Review of Systems  ?Constitutional:  Positive for appetite change and fatigue.  ?HENT:   Negative for mouth sores.   ?Gastrointestinal:   Positive for abdominal pain (5/10) and rectal pain. Negative for diarrhea, nausea and vomiting.  ?     Black stools  ?All other systems reviewed and are negative. ? ?PAST MEDICAL/SURGICAL HISTORY:  ?Past Medical History:  ?Diagnosis Date  ? Anemia   ? Aortic valve stenosis   ? mild AS 04/2017 (followed by Dr. Cleora Fleet, [place])  ? Bilateral hearing loss   ? bilateral hearing aids  ? Cancer [facility]) 2018  ? basal cell carcinoma on nose  ? Chronic kidney disease   ? ckd stage 4, bilateral cysts on kidneys [facility])  ? Dysrhythmia   ? RBBB  ? GERD (gastroesophageal reflux disease)   ? Gout   ? left foot  ? Heart murmur   ? no problems  ? History of blood transfusion 2018  ? History of radiation therapy 11/18/19- 12/16/19  ? Larynx 54 Gy in 20 fx  ? Hyperlipidemia   ? Hypertension   ? Port-A-Cath in place 01/05/2022  ? Subdural hematoma 11/2018  ? Wears glasses   ? ?Past Surgical History:  ?Procedure Laterality Date  ? BURR HOLE FOR SUBDURAL HEMATOMA  12/15/2018  ? brain  ? COLONOSCOPY    ? EYE SURGERY Bilateral   ? cataracts removed  ? MICROLARYNGOSCOPY N/A 10/23/2019  ? Procedure: Microlaryngoscopy with vocal cord lesion biopsy;  Surgeon: Jerrell Belfast, MD;  Location: [facility];  Service: ENT;  Laterality:  N/A;  ? PERITONEAL CATHETER INSERTION    ? PORTACATH PLACEMENT Right 01/15/2022  ? Procedure: INSERTION PORT-A-CATH RIJ;  Surgeon: Rusty Aus, DO;  Location: [facility];  Service: General;  Laterality: Right;  pt can't come earlier d/t peritoneal dialysis  ? UPPER GI ENDOSCOPY    ? ? ?SOCIAL HISTORY:  ?Social History  ? ?Socioeconomic History  ? Marital status: Married  ?  Spouse name: Not on file  ? Number of children: Not on file  ? Years of education: Not on file  ? Highest education level: Not on file  ?Occupational History  ? Not on file  ?Tobacco Use  ? Smoking status: Former  ?  Packs/day: 0.25  ?  Years: 40.00  ?  Pack years: 10.00  ?  Types: Cigarettes  ?  Quit date: 12/22/1996  ?   Years since quitting: 25.2  ? Smokeless tobacco: Never  ? Tobacco comments:  ?  Quit in 1998  ?Vaping Use  ? Vaping Use: Never used  ?Substance and Sexual Activity  ? Alcohol use: Never  ? Drug use: Never  ? Sexual activity: Yes  ?Other Topics Concern  ? Not on file  ?Social History Narrative  ? Not on file  ? ?Social Determinants of Health  ? ?Financial Resource Strain: Not on file  ?Food Insecurity: Not on file  ?Transportation Needs: Not on file  ?Physical Activity: Not on file  ?Stress: Not on file  ?Social Connections: Not on file  ?Intimate Partner Violence: Not on file  ? ? ?FAMILY HISTORY:  ?History reviewed. No pertinent family history. ? ?CURRENT MEDICATIONS:  ?Current Outpatient Medications  ?Medication Sig Dispense Refill  ? atorvastatin (LIPITOR) 20 MG tablet Take 20 mg by mouth daily at 6 PM.     ? B12-ACTIVE 1 MG CHEW Chew 1 tablet by mouth daily.    ? calcium acetate (PHOSLO) 667 MG tablet Take 667 mg by mouth 3 (three) times daily.    ? Cholecalciferol 25 MCG (1000 UT) capsule Take 1,000 Units by mouth daily.    ? dicyclomine (BENTYL) 10 MG capsule Take 1 capsule (10 mg total) by mouth 4 (four) times daily -  before meals and at bedtime. 120 capsule 1  ? docusate sodium (COLACE) 100 MG capsule Take 100 mg by mouth 2 (two) times daily.    ? epoetin alfa (EPOGEN) 10000 UNIT/ML injection Inject 1 Units into the skin every 14 (fourteen) days.    ? fluorouracil CALGB 27035 2,400 mg/m2 in sodium chloride 0.9 % 150 mL Inject 7,650 mg into the vein over 96 hr. Day 1, Day 28    ? GNP IRON 200 (65 Fe) MG TABS SMARTSIG:1 Tablet(s) By Mouth    ? hydrALAZINE (APRESOLINE) 100 MG tablet Take 100 mg by mouth 3 (three) times daily.    ? levothyroxine (SYNTHROID) 25 MCG tablet Take 37.5 mcg by mouth every morning.    ? magnesium oxide (MAG-OX) 400 (240 Mg) MG tablet Take 1 tablet (400 mg total) by mouth 2 (two) times daily. 60 tablet 3  ? Methoxy PEG-Epoetin Beta (MIRCERA IJ) Inject into the skin.    ? mirtazapine  (REMERON) 15 MG tablet Take 15 mg by mouth daily.    ? mitoMYcin (MUTAMYCIN) 20 MG chemo injection Inject into the vein once. Day 1 and Day 28    ? oxyCODONE (ROXICODONE) 5 MG immediate release tablet Take 1 tablet (5 mg total) by mouth every 8 (eight) hours as needed. 10 tablet  0  ? pantoprazole (PROTONIX) 40 MG tablet Take 40 mg by mouth every morning.    ? sodium bicarbonate 650 MG tablet SMARTSIG:1 Tablet(s) By Mouth    ? sucroferric oxyhydroxide (VELPHORO) 500 MG chewable tablet Chew 500 tablets by mouth daily.    ? lidocaine-prilocaine (EMLA) cream Apply a small amount to port a cath site and cover with plastic wrap 1 hour prior to infusion appointments (Patient not taking: Reported on 02/26/2022) 30 g 3  ? prochlorperazine (COMPAZINE) 10 MG tablet Take 1 tablet (10 mg total) by mouth every 6 (six) hours as needed (Nausea or vomiting). (Patient not taking: Reported on 02/26/2022) 30 tablet 1  ? ?No current facility-administered medications for this visit.  ? ? ?ALLERGIES:  ?No Known Allergies ? ?PHYSICAL EXAM:  ?Performance status (ECOG): 1 - Symptomatic but completely ambulatory ? ?There were no vitals filed for this visit. ?Wt Readings from Last 3 Encounters:  ?03/05/22 172 lb 6.4 oz (78.2 kg)  ?02/26/22 174 lb 6.4 oz (79.1 kg)  ?02/19/22 171 lb 1.6 oz (77.6 kg)  ? ?Physical Exam ?Vitals reviewed.  ?Constitutional:   ?   Appearance: Normal appearance.  ?Cardiovascular:  ?   Rate and Rhythm: Normal rate and regular rhythm.  ?   Pulses: Normal pulses.  ?   Heart sounds: Murmur heard.  ?Systolic murmur is present.  ?Pulmonary:  ?   Effort: Pulmonary effort is normal.  ?   Breath sounds: Normal breath sounds.  ?Abdominal:  ?   Palpations: Abdomen is soft. There is no mass.  ?   Tenderness: There is abdominal tenderness (midline below umbilicus).  ?Musculoskeletal:  ?   Right lower leg: No edema.  ?   Left lower leg: No edema.  ?Neurological:  ?   General: No focal deficit present.  ?   Mental Status: He is alert  and oriented to person, place, and time.  ?Psychiatric:     ?   Mood and Affect: Mood normal.     ?   Behavior: Behavior normal.  ? ? ?LABORATORY DATA:  ?I have reviewed the labs as listed.  ?CBC Latest R

## 2022-03-09 ENCOUNTER — Inpatient Hospital Stay (HOSPITAL_COMMUNITY): Payer: Medicare Other

## 2022-03-09 ENCOUNTER — Other Ambulatory Visit: Payer: Self-pay

## 2022-03-09 VITALS — BP 124/50 | HR 81 | Temp 97.9°F | Resp 18

## 2022-03-09 DIAGNOSIS — Z5111 Encounter for antineoplastic chemotherapy: Secondary | ICD-10-CM | POA: Diagnosis not present

## 2022-03-09 DIAGNOSIS — Z95828 Presence of other vascular implants and grafts: Secondary | ICD-10-CM

## 2022-03-09 DIAGNOSIS — C210 Malignant neoplasm of anus, unspecified: Secondary | ICD-10-CM

## 2022-03-09 MED ORDER — HEPARIN SOD (PORK) LOCK FLUSH 100 UNIT/ML IV SOLN
500.0000 [IU] | Freq: Once | INTRAVENOUS | Status: AC | PRN
  Administered 2022-03-09: 500 [IU]

## 2022-03-09 MED ORDER — SODIUM CHLORIDE 0.9% FLUSH
10.0000 mL | INTRAVENOUS | Status: DC | PRN
  Administered 2022-03-09: 10 mL

## 2022-03-09 NOTE — Patient Instructions (Signed)
[facility]  Discharge Instructions: ?Thank you for choosing [facility] to provide your oncology and hematology care.  ?If you have a lab appointment with the [facility], please come in thru the Main Entrance and check in at the main information desk. ? ?Wear comfortable clothing and clothing appropriate for easy access to any Portacath or PICC line.  ? ?We strive to give you quality time with your provider. You may need to reschedule your appointment if you arrive late (15 or more minutes).  Arriving late affects you and other patients whose appointments are after yours.  Also, if you miss three or more appointments without notifying the office, you may be dismissed from the clinic at the provider?s discretion.    ?  ?For prescription refill requests, have your pharmacy contact our office and allow 72 hours for refills to be completed.   ? ?Today you received 5FU pump disconnection ?  ? ? ?BELOW ARE SYMPTOMS THAT SHOULD BE REPORTED IMMEDIATELY: ?*FEVER GREATER THAN 100.4 F (38 ?C) OR HIGHER ?*CHILLS OR SWEATING ?*NAUSEA AND VOMITING THAT IS NOT CONTROLLED WITH YOUR NAUSEA MEDICATION ?*UNUSUAL SHORTNESS OF BREATH ?*UNUSUAL BRUISING OR BLEEDING ?*URINARY PROBLEMS (pain or burning when urinating, or frequent urination) ?*BOWEL PROBLEMS (unusual diarrhea, constipation, pain near the anus) ?TENDERNESS IN MOUTH AND THROAT WITH OR WITHOUT PRESENCE OF ULCERS (sore throat, sores in mouth, or a toothache) ?UNUSUAL RASH, SWELLING OR PAIN  ?UNUSUAL VAGINAL DISCHARGE OR ITCHING  ? ?Items with * indicate a potential emergency and should be followed up as soon as possible or go to the Emergency Department if any problems should occur. ? ?Please show the CHEMOTHERAPY ALERT CARD or IMMUNOTHERAPY ALERT CARD at check-in to the Emergency Department and triage nurse. ? ?Should you have questions after your visit or need to cancel or reschedule your appointment, please contact [facility]  [phone]  and follow the prompts.  Office hours are 8:00 a.m. to 4:30 p.m. Monday - Friday. Please note that voicemails left after 4:00 p.m. may not be returned until the following business day.  We are closed weekends and major holidays. You have access to a nurse at all times for urgent questions. Please call the main number to the clinic [phone] and follow the prompts. ? ?For any non-urgent questions, you may also contact your provider using MyChart. We now offer e-Visits for anyone 62 and older to request care online for non-urgent symptoms. For details visit [email or web address]. ?  ?Also download the MyChart app! Go to the app store, search "MyChart", open the app, select [facility], and log in with your MyChart username and password. ? ?Due to Covid, a mask is required upon entering the hospital/clinic. If you do not have a mask, one will be given to you upon arrival. For doctor visits, patients may have 1 support person aged 41 or older with them. For treatment visits, patients cannot have anyone with them due to current Covid guidelines and our immunocompromised population.  ?

## 2022-03-09 NOTE — Progress Notes (Signed)
Pt presents today for chemotherapy pump disconnection per provider's order. Port flushed easily without difficulty with 25m of NS and 55mof heparin. Good blood return noted needle removed intact and no bruising or swelling noted at the site. ? ?Discharged from clinic ambulatory in stable condition. Alert and oriented x 3. F/U with [facility] scheduled.   ?

## 2022-04-17 ENCOUNTER — Encounter (HOSPITAL_COMMUNITY): Payer: Self-pay | Admitting: Hematology

## 2022-04-17 ENCOUNTER — Inpatient Hospital Stay (HOSPITAL_COMMUNITY): Payer: Medicare Other | Attending: Hematology | Admitting: Hematology

## 2022-04-17 ENCOUNTER — Inpatient Hospital Stay (HOSPITAL_COMMUNITY): Payer: Medicare Other

## 2022-04-17 ENCOUNTER — Encounter (HOSPITAL_COMMUNITY): Payer: Self-pay | Admitting: *Deleted

## 2022-04-17 VITALS — BP 162/83 | HR 89 | Temp 98.3°F | Resp 18 | Ht 71.0 in | Wt 164.0 lb

## 2022-04-17 DIAGNOSIS — Z87891 Personal history of nicotine dependence: Secondary | ICD-10-CM | POA: Insufficient documentation

## 2022-04-17 DIAGNOSIS — M549 Dorsalgia, unspecified: Secondary | ICD-10-CM | POA: Insufficient documentation

## 2022-04-17 DIAGNOSIS — D649 Anemia, unspecified: Secondary | ICD-10-CM | POA: Insufficient documentation

## 2022-04-17 DIAGNOSIS — C210 Malignant neoplasm of anus, unspecified: Secondary | ICD-10-CM

## 2022-04-17 DIAGNOSIS — C211 Malignant neoplasm of anal canal: Secondary | ICD-10-CM | POA: Diagnosis not present

## 2022-04-17 DIAGNOSIS — Z79899 Other long term (current) drug therapy: Secondary | ICD-10-CM | POA: Insufficient documentation

## 2022-04-17 DIAGNOSIS — Z992 Dependence on renal dialysis: Secondary | ICD-10-CM | POA: Insufficient documentation

## 2022-04-17 DIAGNOSIS — E538 Deficiency of other specified B group vitamins: Secondary | ICD-10-CM | POA: Diagnosis not present

## 2022-04-17 DIAGNOSIS — D518 Other vitamin B12 deficiency anemias: Secondary | ICD-10-CM

## 2022-04-17 DIAGNOSIS — R197 Diarrhea, unspecified: Secondary | ICD-10-CM | POA: Diagnosis not present

## 2022-04-17 DIAGNOSIS — N186 End stage renal disease: Secondary | ICD-10-CM | POA: Insufficient documentation

## 2022-04-17 DIAGNOSIS — R634 Abnormal weight loss: Secondary | ICD-10-CM | POA: Diagnosis not present

## 2022-04-17 DIAGNOSIS — D509 Iron deficiency anemia, unspecified: Secondary | ICD-10-CM

## 2022-04-17 DIAGNOSIS — Z8521 Personal history of malignant neoplasm of larynx: Secondary | ICD-10-CM | POA: Diagnosis not present

## 2022-04-17 DIAGNOSIS — Z95828 Presence of other vascular implants and grafts: Secondary | ICD-10-CM

## 2022-04-17 LAB — MAGNESIUM: Magnesium: 2.2 mg/dL (ref 1.7–2.4)

## 2022-04-17 LAB — CBC WITH DIFFERENTIAL/PLATELET
Abs Immature Granulocytes: 0.02 10*3/uL (ref 0.00–0.07)
Basophils Absolute: 0 10*3/uL (ref 0.0–0.1)
Basophils Relative: 1 %
Eosinophils Absolute: 0.1 10*3/uL (ref 0.0–0.5)
Eosinophils Relative: 2 %
HCT: 29.5 % — ABNORMAL LOW (ref 39.0–52.0)
Hemoglobin: 9.2 g/dL — ABNORMAL LOW (ref 13.0–17.0)
Immature Granulocytes: 1 %
Lymphocytes Relative: 4 %
Lymphs Abs: 0.2 10*3/uL — ABNORMAL LOW (ref 0.7–4.0)
MCH: 31.3 pg (ref 26.0–34.0)
MCHC: 31.2 g/dL (ref 30.0–36.0)
MCV: 100.3 fL — ABNORMAL HIGH (ref 80.0–100.0)
Monocytes Absolute: 0.4 10*3/uL (ref 0.1–1.0)
Monocytes Relative: 8 %
Neutro Abs: 3.7 10*3/uL (ref 1.7–7.7)
Neutrophils Relative %: 84 %
Platelets: 195 10*3/uL (ref 150–400)
RBC: 2.94 MIL/uL — ABNORMAL LOW (ref 4.22–5.81)
RDW: 15.5 % (ref 11.5–15.5)
WBC: 4.3 10*3/uL (ref 4.0–10.5)
nRBC: 0 % (ref 0.0–0.2)

## 2022-04-17 LAB — COMPREHENSIVE METABOLIC PANEL
ALT: 13 U/L (ref 0–44)
AST: 18 U/L (ref 15–41)
Albumin: 2.5 g/dL — ABNORMAL LOW (ref 3.5–5.0)
Alkaline Phosphatase: 75 U/L (ref 38–126)
Anion gap: 8 (ref 5–15)
BUN: 56 mg/dL — ABNORMAL HIGH (ref 8–23)
CO2: 29 mmol/L (ref 22–32)
Calcium: 8.1 mg/dL — ABNORMAL LOW (ref 8.9–10.3)
Chloride: 103 mmol/L (ref 98–111)
Creatinine, Ser: 6.39 mg/dL — ABNORMAL HIGH (ref 0.61–1.24)
GFR, Estimated: 8 mL/min — ABNORMAL LOW (ref >60)
Glucose, Bld: 127 mg/dL — ABNORMAL HIGH (ref 70–99)
Potassium: 4.2 mmol/L (ref 3.5–5.1)
Sodium: 140 mmol/L (ref 135–145)
Total Bilirubin: 0.4 mg/dL (ref 0.3–1.2)
Total Protein: 5.6 g/dL — ABNORMAL LOW (ref 6.5–8.1)

## 2022-04-17 LAB — IRON AND TIBC
Iron: 35 ug/dL — ABNORMAL LOW (ref 45–182)
Saturation Ratios: 18 % (ref 17.9–39.5)
TIBC: 200 ug/dL — ABNORMAL LOW (ref 250–450)
UIBC: 165 ug/dL

## 2022-04-17 LAB — VITAMIN B12: Vitamin B-12: 765 pg/mL (ref 180–914)

## 2022-04-17 LAB — FERRITIN: Ferritin: 471 ng/mL — ABNORMAL HIGH (ref 24–336)

## 2022-04-17 MED ORDER — SODIUM CHLORIDE 0.9% FLUSH
10.0000 mL | Freq: Once | INTRAVENOUS | Status: AC
  Administered 2022-04-17: 10 mL via INTRAVENOUS

## 2022-04-17 MED ORDER — HEPARIN SOD (PORK) LOCK FLUSH 100 UNIT/ML IV SOLN
500.0000 [IU] | Freq: Once | INTRAVENOUS | Status: AC
  Administered 2022-04-17: 500 [IU] via INTRAVENOUS

## 2022-04-17 NOTE — Patient Instructions (Addendum)
[facility] at [facility] ?Discharge Instructions ? ? ?You were seen and examined today by Dr. Delton Coombes. ? ?He reviewed your lab work which is stable.  ? ?We will repeat a PET scan in 1 month. ? ?Return as scheduled.  ? ? ?Thank you for choosing [facility] at [facility] to provide your oncology and hematology care.  To afford each patient quality time with our provider, please arrive at least 15 minutes before your scheduled appointment time.  ? ?If you have a lab appointment with the [facility] please come in thru the Main Entrance and check in at the main information desk. ? ?You need to re-schedule your appointment should you arrive 10 or more minutes late.  We strive to give you quality time with our providers, and arriving late affects you and other patients whose appointments are after yours.  Also, if you no show three or more times for appointments you may be dismissed from the clinic at the providers discretion.     ?Again, thank you for choosing [facility]  Our hope is that these requests will decrease the amount of time that you wait before being seen by our physicians.       ?_____________________________________________________________ ? ?Should you have questions after your visit to [facility], please contact our office at [phone] and follow the prompts.  Our office hours are 8:00 a.m. and 4:30 p.m. Monday - Friday.  Please note that voicemails left after 4:00 p.m. may not be returned until the following business day.  We are closed weekends and major holidays.  You do have access to a nurse 24-7, just call the main number to the clinic [phone] and do not press any options, hold on the line and a nurse will answer the phone.   ? ?For prescription refill requests, have your pharmacy contact our office and allow 72 hours.   ? ?Due to Covid, you will need to wear a mask upon entering the hospital. If you do not  have a mask, a mask will be given to you at the Main Entrance upon arrival. For doctor visits, patients may have 1 support person age 49 or older with them. For treatment visits, patients can not have anyone with them due to social distancing guidelines and our immunocompromised population.  ? ?   ?

## 2022-04-17 NOTE — Progress Notes (Signed)
? ?[facility] ?[address] ?[place], [place] [address] ? ? ?CLINIC:  ?Medical Oncology/Hematology ? ?PCP:  ?Pomposini, Cherly Anderson, MD ?No address on file ?None ? ? 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 ?PRIOR THERAPY: none ? ?NGS Results: not done ? ?CURRENT THERAPY:  ?Mitomycin D1,28 / 5FU D1-4, 28-31 q32d ?XRT with 5-FU started on 01/29/2022 ? 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 Staging form: Larynx - Glottis, AJCC 8th Edition ?- Clinical stage from 11/09/2019: Stage 0 (cTis, cN0, cM0) - Signed by Eppie Gibson, MD on 11/10/2019 ? ?  ?11/10/2019 Initial Diagnosis  ? 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 ?  ?Anal cancer ([place])  ?12/19/2021 Initial Diagnosis  ? Anal cancer ([place]) ? ?  ?01/29/2022 -  Chemotherapy  ? Patient is on Treatment Plan : ANUS 5FU weekly pump   ? ?  ?  ? ? ?CANCER STAGING: ?Cancer Staging  ?Anal cancer ([place]) ?Staging form: Anus, AJCC V9 ?- Clinical stage from 12/20/2021: Stage IIA (cT2, cN0, cM0) - Unsigned ? 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 ? ?INTERVAL HISTORY:  ?[name], a 88 y.o. 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 Jason Avila was last seen on 03/05/2022.  ? ?Today he reports feeling well. He reports severe back pain radiating down into his thighs starting 2 weeks ago following twisting his back while operating his lawn mower and tiller. His appetite is poor, and his energy is low. He denies fevers and infections. He reports firm black stools 1-2 times daily with fecal incontinence. He has lost 8 lbs since his last visit.  ? ?REVIEW OF SYSTEMS:  ?Review of Systems  ?Constitutional:  Positive for appetite change, fatigue and unexpected weight change  (-8 lbs). Negative for fever.  ?Gastrointestinal:  Positive for diarrhea.  ?     Black stools  ?Genitourinary:  Positive for bladder incontinence (fecal).   ?Musculoskeletal:  Positive for back pain (8/10).  ?All other systems reviewed and are negative. ? ?PAST MEDICAL/SURGICAL HISTORY:  ?Past Medical History:  ?Diagnosis Date  ? Anemia   ? Aortic valve stenosis   ? mild AS 04/2017 (followed by Dr. Cleora Fleet, [place])  ? Bilateral hearing loss   ? bilateral hearing aids  ? Cancer [facility]) 2018  ? basal cell carcinoma on nose  ? Chronic kidney disease   ? ckd stage 4, bilateral cysts on kidneys [facility])  ? Dysrhythmia   ? RBBB  ? GERD (gastroesophageal reflux disease)   ? Gout   ? left foot  ? Heart murmur   ? no problems  ? History of blood transfusion 2018  ? History of radiation therapy 11/18/19- 12/16/19  ? Larynx 54 Gy in 20 fx  ? Hyperlipidemia   ? Hypertension   ? Port-A-Cath in place 01/05/2022  ? Subdural hematoma 11/2018  ? Wears glasses   ? ?Past Surgical History:  ?Procedure Laterality Date  ? BURR HOLE FOR SUBDURAL HEMATOMA  12/15/2018  ? brain  ? COLONOSCOPY    ? EYE SURGERY Bilateral   ? cataracts removed  ? MICROLARYNGOSCOPY N/A 10/23/2019  ? Procedure: Microlaryngoscopy with vocal cord lesion biopsy;  Surgeon: Wilburn Cornelia,  Shanon Brow, MD;  Location: [facility];  Service: ENT;  Laterality: N/A;  ? PERITONEAL CATHETER INSERTION    ? PORTACATH PLACEMENT Right 01/15/2022  ? Procedure: INSERTION PORT-A-CATH RIJ;  Surgeon: Rusty Aus, DO;  Location[facility];  Service: General;  Laterality: Right;  pt can't come earlier d/t peritoneal dialysis  ? UPPER GI ENDOSCOPY    ? ? ?SOCIAL HISTORY:  ?Social History  ? ?Socioeconomic History  ? Marital status: Married  ?  Spouse name: Not on file  ? Number of children: Not on file  ? Years of education: Not on file  ? Highest education level: Not on file  ?Occupational History  ? Not on file  ?Tobacco Use  ? Smoking status: Former  ?  Packs/day:  0.25  ?  Years: 40.00  ?  Pack years: 10.00  ?  Types: Cigarettes  ?  Quit date: 12/22/1996  ?  Years since quitting: 25.3  ? Smokeless tobacco: Never  ? Tobacco comments:  ?  Quit in 1998  ?Vaping Use  ? Vaping Use: Never used  ?Substance and Sexual Activity  ? Alcohol use: Never  ? Drug use: Never  ? Sexual activity: Yes  ?Other Topics Concern  ? Not on file  ?Social History Narrative  ? Not on file  ? ?Social Determinants of Health  ? ?Financial Resource Strain: Not on file  ?Food Insecurity: Not on file  ?Transportation Needs: Not on file  ?Physical Activity: Not on file  ?Stress: Not on file  ?Social Connections: Not on file  ?Intimate Partner Violence: Not on file  ? ? ?FAMILY HISTORY:  ?No family history on file. ? ?CURRENT MEDICATIONS:  ?Current Outpatient Medications  ?Medication Sig Dispense Refill  ? atorvastatin (LIPITOR) 20 MG tablet Take 20 mg by mouth daily at 6 PM.     ? B12-ACTIVE 1 MG CHEW Chew 1 tablet by mouth daily.    ? calcium acetate (PHOSLO) 667 MG tablet Take 667 mg by mouth 3 (three) times daily.    ? Cholecalciferol 25 MCG (1000 UT) capsule Take 1,000 Units by mouth daily.    ? dicyclomine (BENTYL) 10 MG capsule Take 1 capsule (10 mg total) by mouth 4 (four) times daily -  before meals and at bedtime. 120 capsule 1  ? docusate sodium (COLACE) 100 MG capsule Take 100 mg by mouth 2 (two) times daily.    ? epoetin alfa (EPOGEN) 10000 UNIT/ML injection Inject 1 Units into the skin every 14 (fourteen) days.    ? fluorouracil CALGB 34917 2,400 mg/m2 in sodium chloride 0.9 % 150 mL Inject 7,650 mg into the vein over 96 hr. Day 1, Day 28    ? GNP IRON 200 (65 Fe) MG TABS SMARTSIG:1 Tablet(s) By Mouth    ? hydrALAZINE (APRESOLINE) 100 MG tablet Take 100 mg by mouth 3 (three) times daily.    ? levothyroxine (SYNTHROID) 25 MCG tablet Take 37.5 mcg by mouth every morning.    ? lidocaine-prilocaine (EMLA) cream Apply a small amount to port a cath site and cover with plastic wrap 1 hour prior to infusion  appointments (Patient not taking: Reported on 02/26/2022) 30 g 3  ? magnesium oxide (MAG-OX) 400 (240 Mg) MG tablet Take 1 tablet (400 mg total) by mouth 2 (two) times daily. 60 tablet 3  ? Methoxy PEG-Epoetin Beta (MIRCERA IJ) Inject into the skin.    ? mirtazapine (REMERON) 15 MG tablet Take 15 mg by mouth daily.    ?  mitoMYcin (MUTAMYCIN) 20 MG chemo injection Inject into the vein once. Day 1 and Day 28    ? oxyCODONE (ROXICODONE) 5 MG immediate release tablet Take 1 tablet (5 mg total) by mouth every 8 (eight) hours as needed. 10 tablet 0  ? pantoprazole (PROTONIX) 40 MG tablet Take 40 mg by mouth every morning.    ? prochlorperazine (COMPAZINE) 10 MG tablet Take 1 tablet (10 mg total) by mouth every 6 (six) hours as needed (Nausea or vomiting). (Patient not taking: Reported on 02/26/2022) 30 tablet 1  ? sodium bicarbonate 650 MG tablet SMARTSIG:1 Tablet(s) By Mouth    ? sucroferric oxyhydroxide (VELPHORO) 500 MG chewable tablet Chew 500 tablets by mouth daily.    ? ?No current facility-administered medications for this visit.  ? ? ?ALLERGIES:  ?No Known Allergies ? ?PHYSICAL EXAM:  ?Performance status (ECOG): 1 - Symptomatic but completely ambulatory ? ?There were no vitals filed for this visit. ?Wt Readings from Last 3 Encounters:  ?03/05/22 172 lb 6.4 oz (78.2 kg)  ?02/26/22 174 lb 6.4 oz (79.1 kg)  ?02/19/22 171 lb 1.6 oz (77.6 kg)  ? ?Physical Exam ?Vitals reviewed.  ?Constitutional:   ?   Appearance: Normal appearance.  ?Cardiovascular:  ?   Rate and Rhythm: Normal rate and regular rhythm.  ?   Pulses: Normal pulses.  ?   Heart sounds: Normal heart sounds.  ?Pulmonary:  ?   Effort: Pulmonary effort is normal.  ?   Breath sounds: Normal breath sounds.  ?Musculoskeletal:  ?   Right lower leg: 1+ Edema present.  ?   Left lower leg: 1+ Edema present.  ?Neurological:  ?   General: No focal deficit present.  ?   Mental Status: He is alert and oriented to person, place, and time.  ?Psychiatric:     ?   Mood and  Affect: Mood normal.     ?   Behavior: Behavior normal.  ?  ? ?LABORATORY DATA:  ?I have reviewed the labs as listed.  ? ?  Latest Ref Rng & Units 03/05/2022  ?  9:43 AM 02/26/2022  ?  9:39 AM 02/19/2022  ?

## 2022-04-17 NOTE — Progress Notes (Signed)
Roddy [place] Crane presented for Portacath access and flush. ?Portacath located right chest wall accessed with  H 20 needle. ?Good blood return present. ?Portacath flushed with 62m NS and 500U/530mHeparin and needle removed intact. ?Procedure without incident. ?Patient tolerated procedure well. ? ? Vitals stable and discharged home from clinic ambulatory. Follow up as scheduled. ? ? ? ?

## 2022-04-20 ENCOUNTER — Emergency Department (HOSPITAL_COMMUNITY): Payer: Medicare Other

## 2022-04-20 ENCOUNTER — Other Ambulatory Visit: Payer: Self-pay

## 2022-04-20 ENCOUNTER — Emergency Department (HOSPITAL_COMMUNITY)
Admission: EM | Admit: 2022-04-20 | Discharge: 2022-04-20 | Disposition: A | Discharge status: Home / Self Care | Payer: Medicare Other | Attending: Emergency Medicine | Admitting: Emergency Medicine

## 2022-04-20 ENCOUNTER — Encounter (HOSPITAL_COMMUNITY): Payer: Self-pay | Admitting: *Deleted

## 2022-04-20 DIAGNOSIS — I129 Hypertensive chronic kidney disease with stage 1 through stage 4 chronic kidney disease, or unspecified chronic kidney disease: Secondary | ICD-10-CM | POA: Diagnosis not present

## 2022-04-20 DIAGNOSIS — N189 Chronic kidney disease, unspecified: Secondary | ICD-10-CM | POA: Insufficient documentation

## 2022-04-20 DIAGNOSIS — D72819 Decreased white blood cell count, unspecified: Secondary | ICD-10-CM | POA: Insufficient documentation

## 2022-04-20 DIAGNOSIS — R011 Cardiac murmur, unspecified: Secondary | ICD-10-CM | POA: Diagnosis not present

## 2022-04-20 DIAGNOSIS — Z992 Dependence on renal dialysis: Secondary | ICD-10-CM | POA: Insufficient documentation

## 2022-04-20 DIAGNOSIS — Z79899 Other long term (current) drug therapy: Secondary | ICD-10-CM | POA: Diagnosis not present

## 2022-04-20 DIAGNOSIS — Z85048 Personal history of other malignant neoplasm of rectum, rectosigmoid junction, and anus: Secondary | ICD-10-CM | POA: Insufficient documentation

## 2022-04-20 DIAGNOSIS — M545 Low back pain, unspecified: Secondary | ICD-10-CM | POA: Diagnosis not present

## 2022-04-20 DIAGNOSIS — C211 Malignant neoplasm of anal canal: Secondary | ICD-10-CM | POA: Diagnosis not present

## 2022-04-20 LAB — URINALYSIS, ROUTINE W REFLEX MICROSCOPIC
Bacteria, UA: NONE SEEN
Bilirubin Urine: NEGATIVE
Glucose, UA: 50 mg/dL — AB
Hgb urine dipstick: NEGATIVE
Ketones, ur: NEGATIVE mg/dL
Leukocytes,Ua: NEGATIVE
Nitrite: NEGATIVE
Protein, ur: 30 mg/dL — AB
Specific Gravity, Urine: 1.008 (ref 1.005–1.030)
pH: 8 (ref 5.0–8.0)

## 2022-04-20 LAB — COMPREHENSIVE METABOLIC PANEL
ALT: 12 U/L (ref 0–44)
AST: 18 U/L (ref 15–41)
Albumin: 2.6 g/dL — ABNORMAL LOW (ref 3.5–5.0)
Alkaline Phosphatase: 80 U/L (ref 38–126)
Anion gap: 9 (ref 5–15)
BUN: 60 mg/dL — ABNORMAL HIGH (ref 8–23)
CO2: 28 mmol/L (ref 22–32)
Calcium: 8.1 mg/dL — ABNORMAL LOW (ref 8.9–10.3)
Chloride: 104 mmol/L (ref 98–111)
Creatinine, Ser: 6.49 mg/dL — ABNORMAL HIGH (ref 0.61–1.24)
GFR, Estimated: 8 mL/min — ABNORMAL LOW (ref >60)
Glucose, Bld: 126 mg/dL — ABNORMAL HIGH (ref 70–99)
Potassium: 3.9 mmol/L (ref 3.5–5.1)
Sodium: 141 mmol/L (ref 135–145)
Total Bilirubin: 0.4 mg/dL (ref 0.3–1.2)
Total Protein: 5.9 g/dL — ABNORMAL LOW (ref 6.5–8.1)

## 2022-04-20 LAB — CBC WITH DIFFERENTIAL/PLATELET
Abs Immature Granulocytes: 0.01 10*3/uL (ref 0.00–0.07)
Basophils Absolute: 0 10*3/uL (ref 0.0–0.1)
Basophils Relative: 1 %
Eosinophils Absolute: 0.1 10*3/uL (ref 0.0–0.5)
Eosinophils Relative: 4 %
HCT: 29.9 % — ABNORMAL LOW (ref 39.0–52.0)
Hemoglobin: 9.2 g/dL — ABNORMAL LOW (ref 13.0–17.0)
Immature Granulocytes: 0 %
Lymphocytes Relative: 8 %
Lymphs Abs: 0.3 10*3/uL — ABNORMAL LOW (ref 0.7–4.0)
MCH: 31.2 pg (ref 26.0–34.0)
MCHC: 30.8 g/dL (ref 30.0–36.0)
MCV: 101.4 fL — ABNORMAL HIGH (ref 80.0–100.0)
Monocytes Absolute: 0.3 10*3/uL (ref 0.1–1.0)
Monocytes Relative: 10 %
Neutro Abs: 2.5 10*3/uL (ref 1.7–7.7)
Neutrophils Relative %: 77 %
Platelets: 193 10*3/uL (ref 150–400)
RBC: 2.95 MIL/uL — ABNORMAL LOW (ref 4.22–5.81)
RDW: 14.8 % (ref 11.5–15.5)
WBC: 3.2 10*3/uL — ABNORMAL LOW (ref 4.0–10.5)
nRBC: 0 % (ref 0.0–0.2)

## 2022-04-20 LAB — POC OCCULT BLOOD, ED: Fecal Occult Bld: NEGATIVE

## 2022-04-20 IMAGING — CT CT L SPINE W/O CM
3 of 4 series · 12 of 33 positions shown, 14 images · non-contrast
Comparison: PET-CT [DATE]

CLINICAL DATA: Low back pain with bilateral radiculopathy. History
of anal cancer



[Series 2: lspine st · axial · 0.47mm/px · z∈[-242,-46]mm · 4 of 148 slices shown, 5 images]
[im 25/148  soft-tissue]
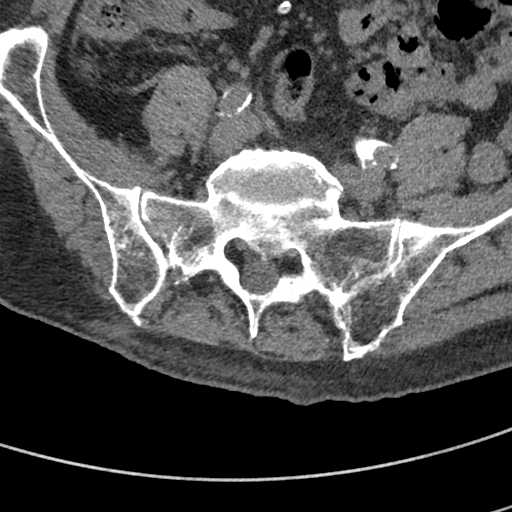
[im 25/148  bone]
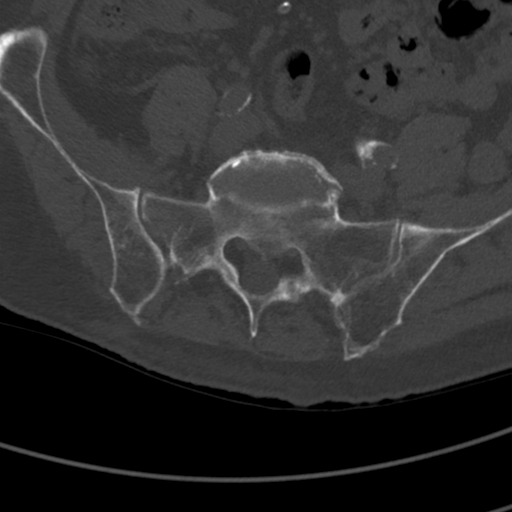
[im 50/148  bone]
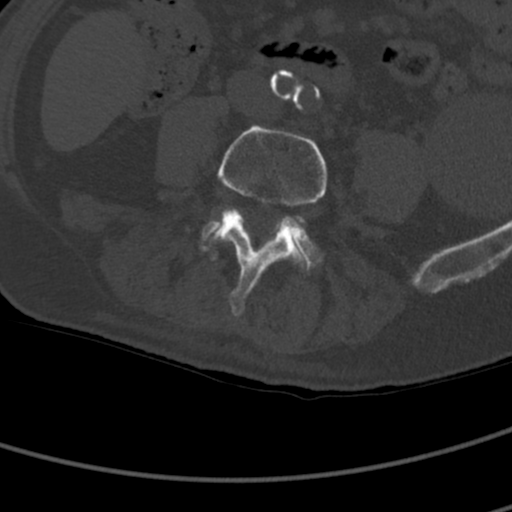
[im 99/148  bone]
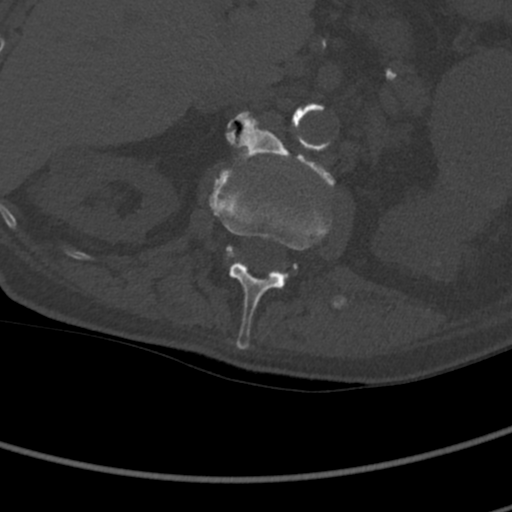
[im 123/148  bone]
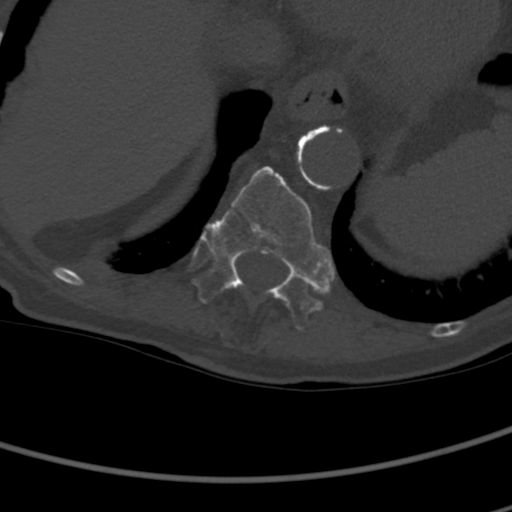

[Series 4: sag bone · sagittal · 0.39mm/px · 5 of 107 slices shown, 6 images]
[im 36/107  bone]
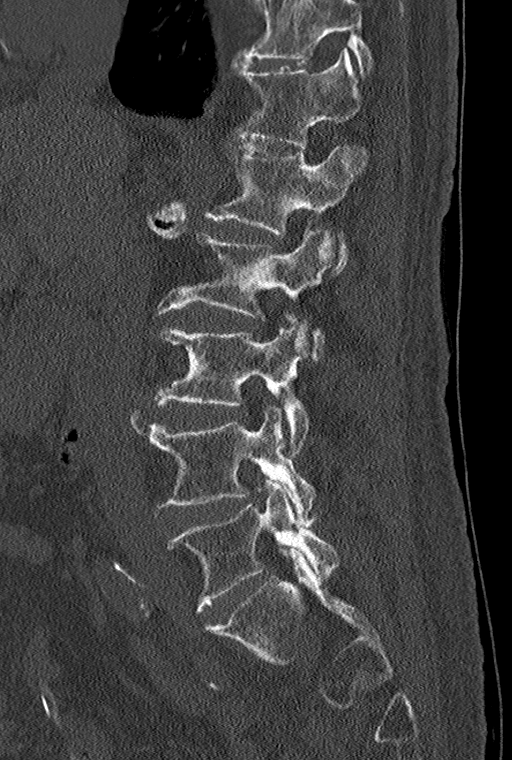
[im 45/107  bone]
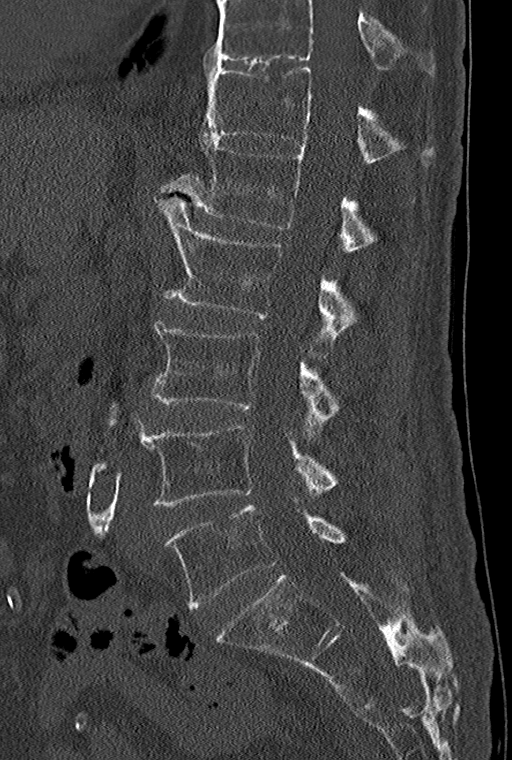
[im 54/107  soft-tissue]
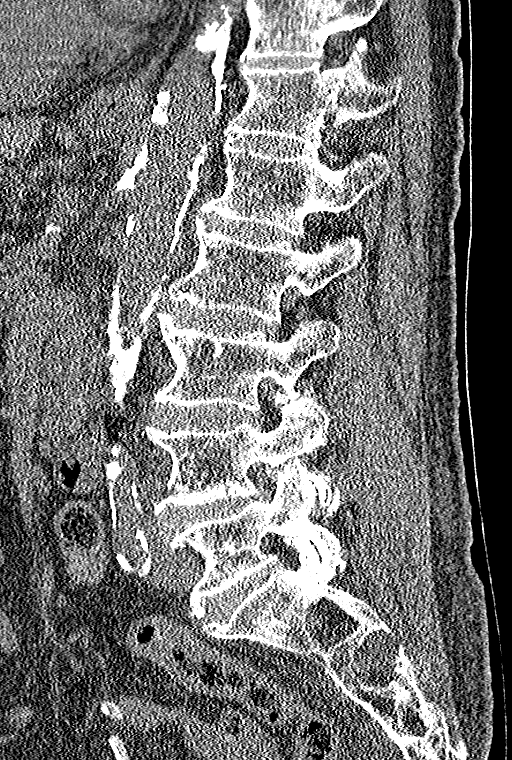
[im 54/107  bone]
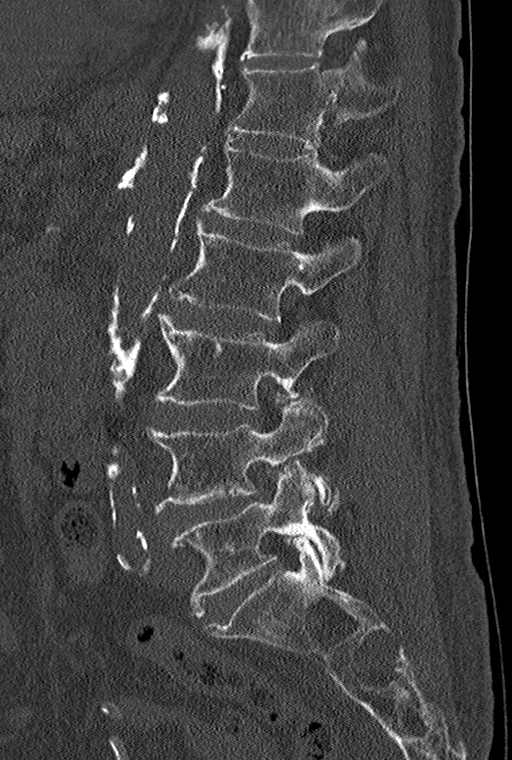
[im 62/107  bone]
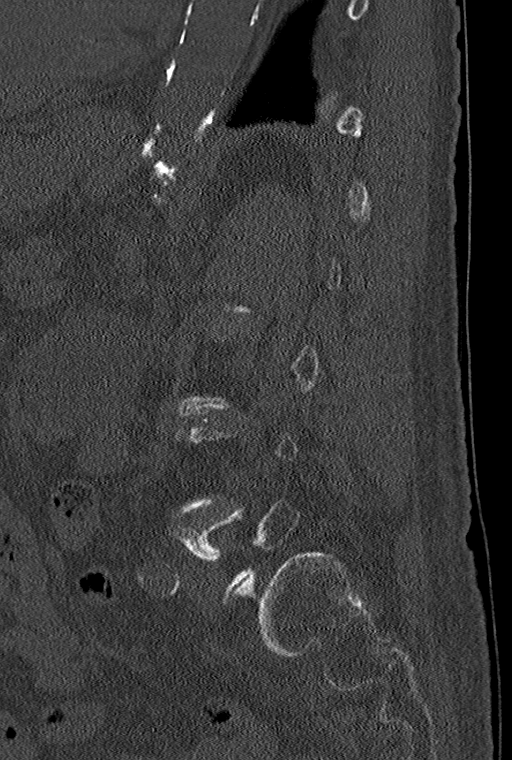
[im 71/107  bone]
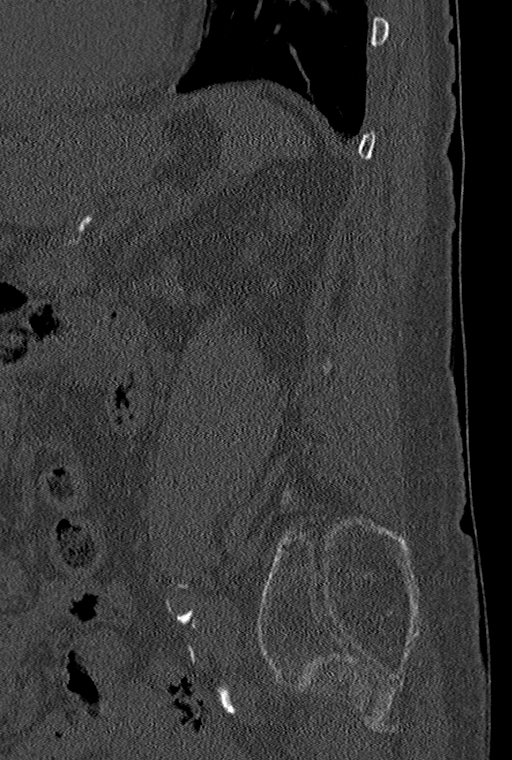

[Series 5: cor bone · coronal · 0.42mm/px · 3 of 101 slices shown]
[im 21/101  bone]
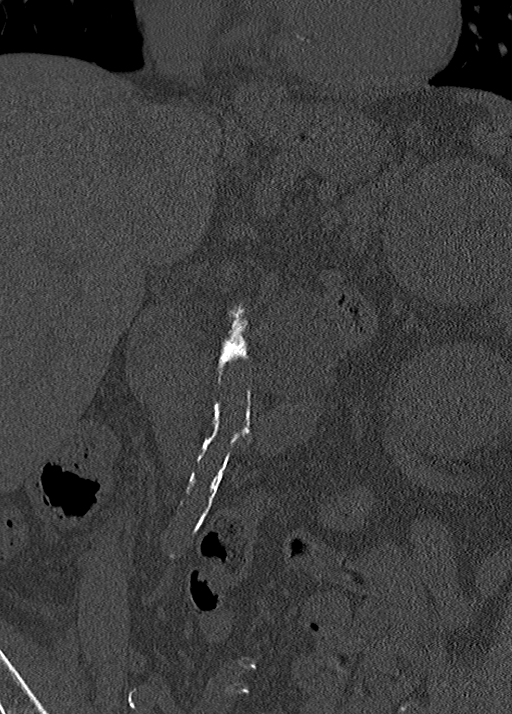
[im 41/101  bone]
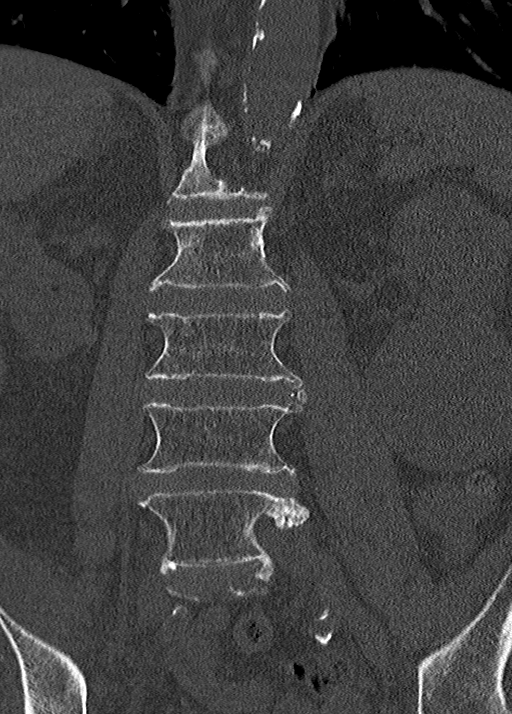
[im 61/101  bone]
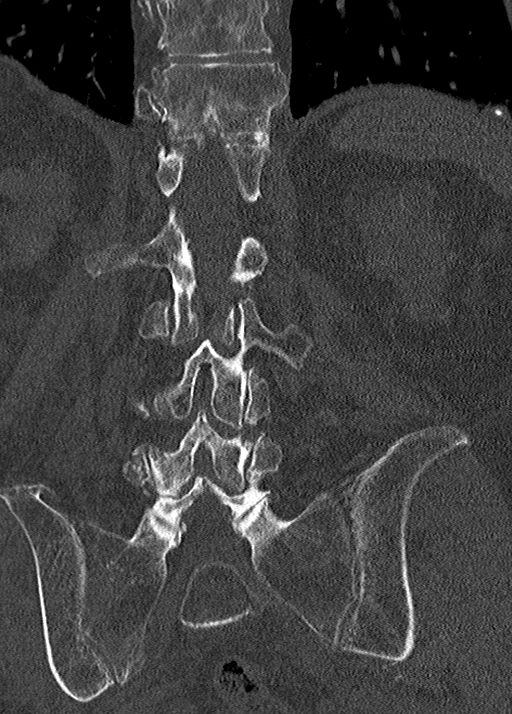

[12 of 33 positions shown; findings below may reference images not displayed]

FINDINGS: Segmentation: 5 lumbar type vertebrae.

Alignment: Normal.

Vertebrae: Subacute fractures involving the posterior left eleventh
and twelfth ribs at the costovertebral junctions (series 3, images 6
and 29). Additional subacute fracture involving the tip of the left
L1 transverse process (series 3, image 47). These fractures are new
from [DATE] but demonstrate partial callus formation. No
evidence of acute fracture. 2.2 cm sclerotic lesion within the L2
vertebral body on the right, stable in appearance from prior PET-CT
and was not hypermetabolic on that exam. No additional bone lesions
are seen. Bones appear demineralized.

Paraspinal and other soft tissues: See dedicated CT abdomen pelvis
report for evaluation of the intra-abdominal structures.

Disc levels: Lumbar intervertebral disc heights are preserved.
Mild-moderate facet arthropathy throughout the lumbar spine without
evidence of high-grade foraminal or canal stenosis by CT.
IMPRESSION: 1. Subacute fractures involving the posterior left eleventh and
twelfth ribs at the costovertebral junctions.
2. Additional subacute fracture involving the tip of the left L1
transverse process.
3. No evidence of acute fracture or traumatic malalignment of the
lumbar spine.
4. Mild-moderate facet arthropathy throughout the lumbar spine.

## 2022-04-20 IMAGING — CT CT ABD-PELV W/O CM
2 of 4 series · 16 of 46 positions shown, 18 images · non-contrast
Comparison: PET-CT dated [DATE]

CLINICAL DATA: Abdominal pain



[Series 1: axial st · axial · 0.81mm/px · z∈[-451,-21]mm · 13 of 99 slices shown, 15 images]
[im 7/99  soft-tissue]
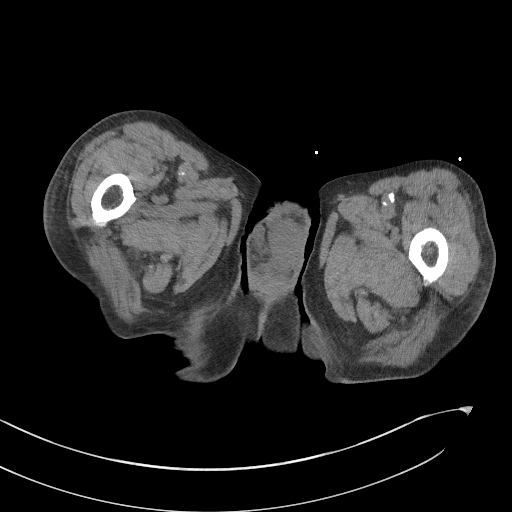
[im 7/99  bone]
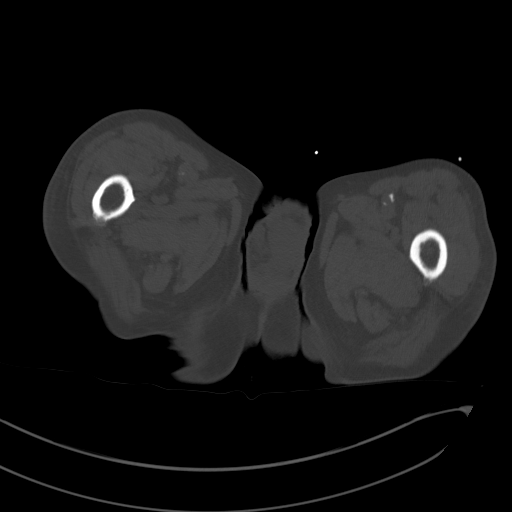
[im 14/99  soft-tissue]
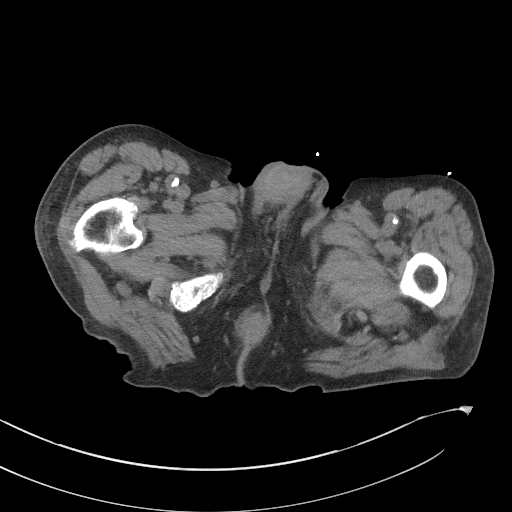
[im 20/99  soft-tissue]
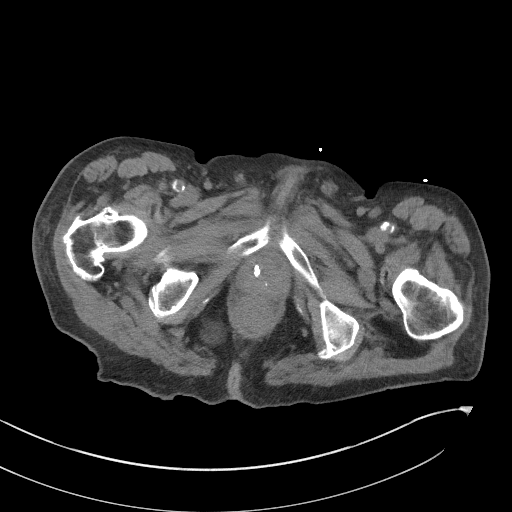
[im 27/99  soft-tissue]
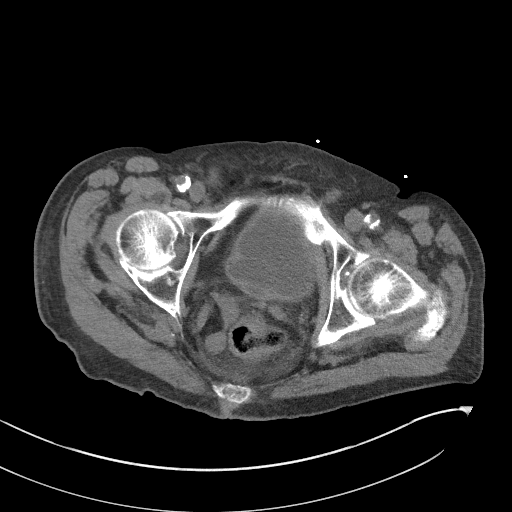
[im 33/99  soft-tissue]
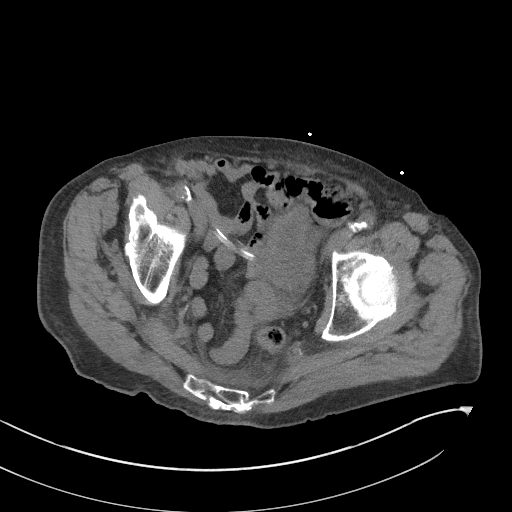
[im 40/99  soft-tissue]
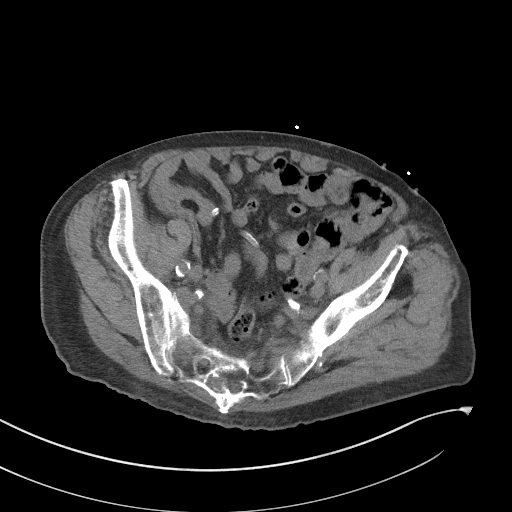
[im 53/99  soft-tissue]
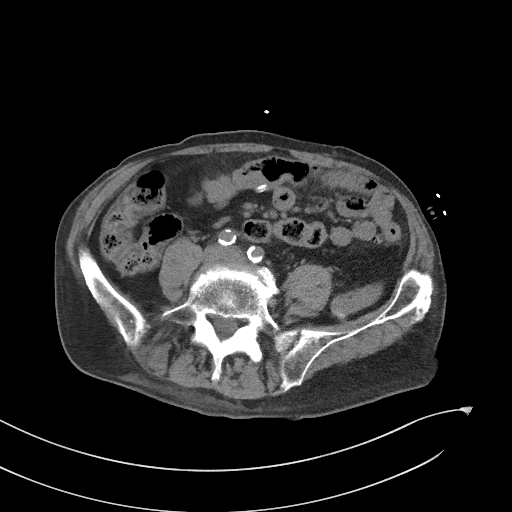
[im 59/99  soft-tissue]
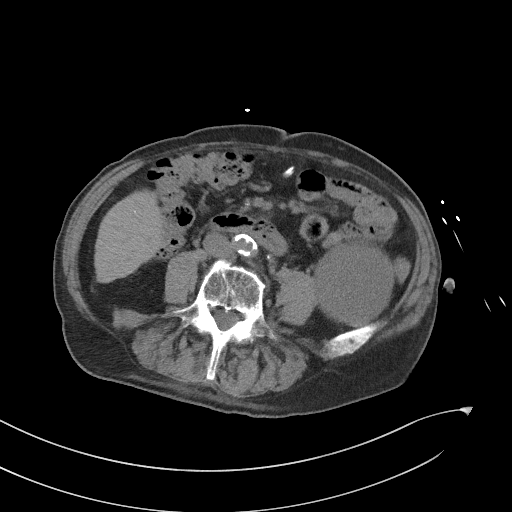
[im 66/99  soft-tissue]
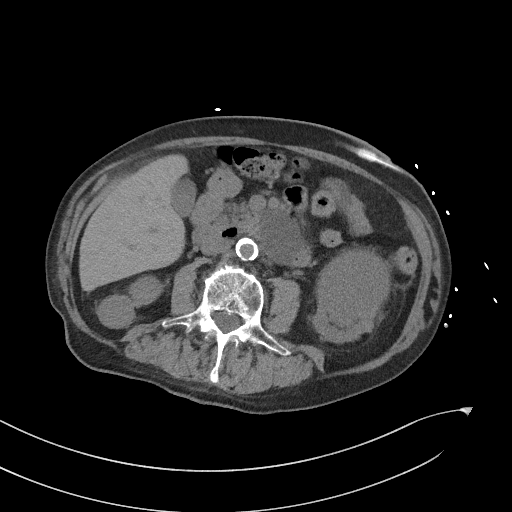
[im 66/99  bone]
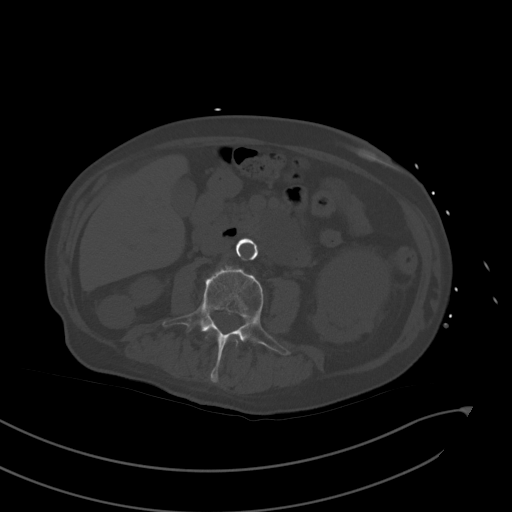
[im 72/99  soft-tissue]
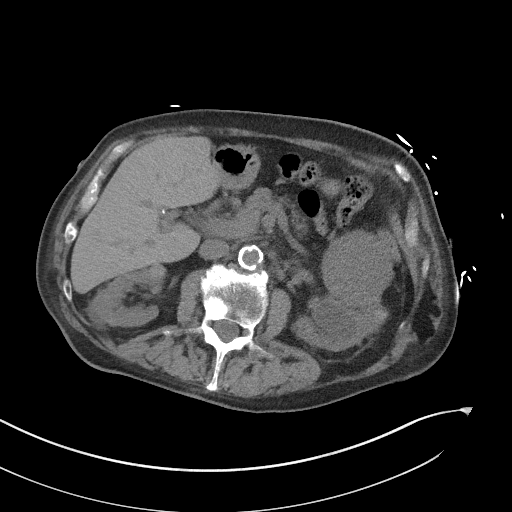
[im 79/99  soft-tissue]
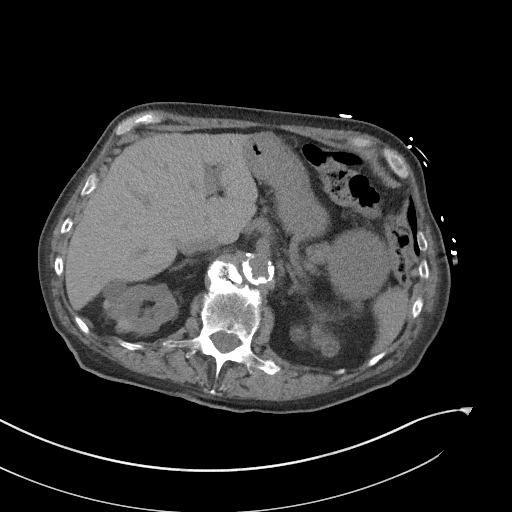
[im 85/99  soft-tissue]
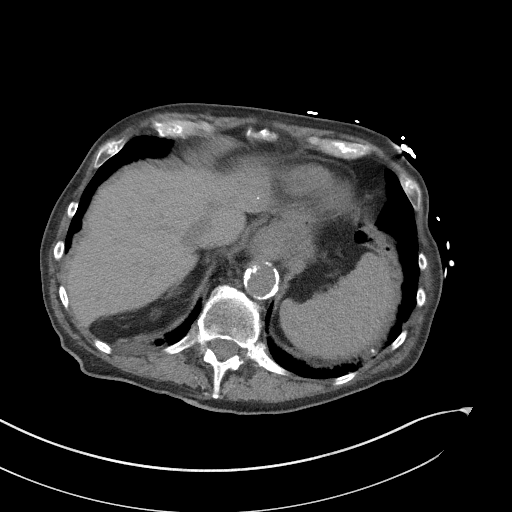
[im 92/99  soft-tissue]
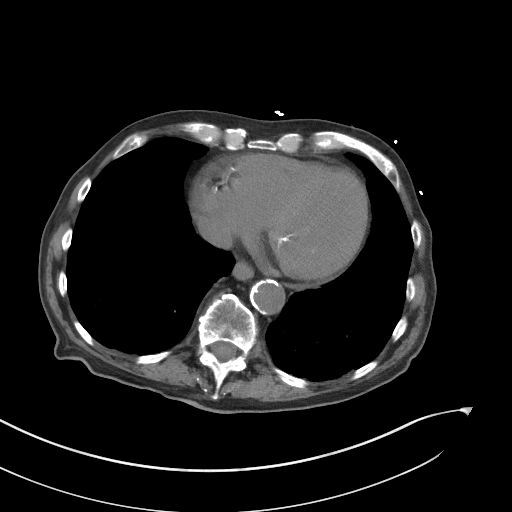

[Series 5: coronal st · coronal · 0.79mm/px · 3 of 96 slices shown]
[im 32/96  soft-tissue]
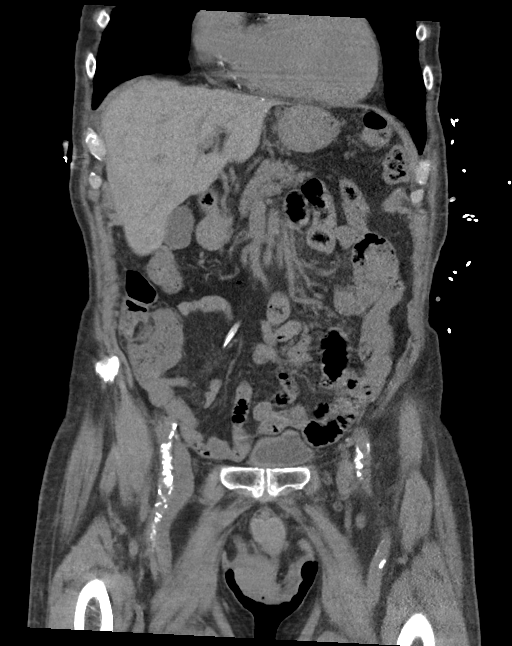
[im 43/96  soft-tissue]
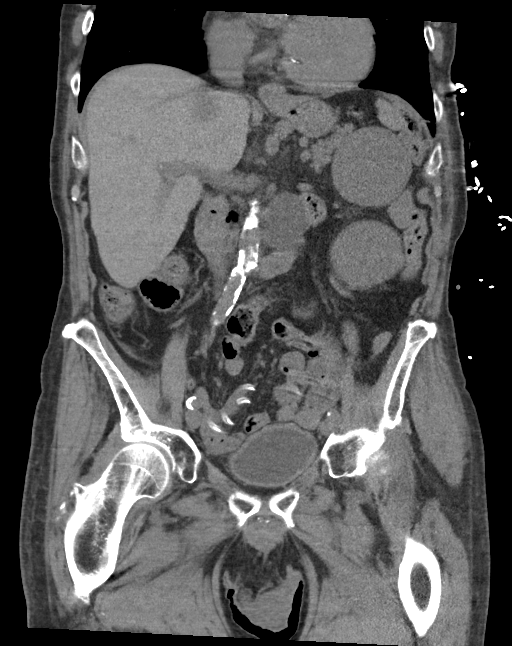
[im 53/96  soft-tissue]
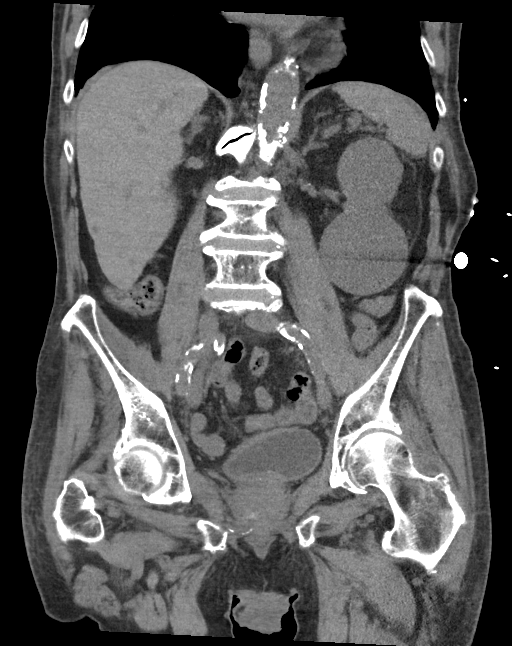

[16 of 46 positions shown; findings below may reference images not displayed]

FINDINGS: Lower chest: No acute abnormality.

Hepatobiliary: No focal liver abnormality is seen. No gallstones,
gallbladder wall thickening, or biliary dilatation.

Pancreas: Cystic lesion of the uncinate process measuring 4.0 cm on
series 5, image 39. No pancreatic ductal dilatation or surrounding
inflammatory changes.

Spleen: Normal in size without focal abnormality.

Adrenals/Urinary Tract: Bilateral adrenal glands are unremarkable.
Numerous bilateral renal cysts varying complexity. No
hydronephrosis.

Stomach/Bowel: Stomach is within normal limits. Appendix appears
normal. No evidence of bowel wall thickening, distention, or
inflammatory changes.

Vascular/Lymphatic: Aortic atherosclerosis. No enlarged abdominal or
pelvic lymph nodes.

Reproductive: Prostatomegaly.

Other: No abdominal wall hernia or abnormality. Mild presacral fat
stranding, interval resolution of small volume pelvic free fluid.
Peritoneal dialysis catheter with distal aspect in the pelvis.

Musculoskeletal: Stable sclerotic lesion of L2, not hypermetabolic
on prior exam. No acute osseous findings.
IMPRESSION: 1. No acute findings in the abdomen or pelvis.
2. Bilateral cystic renal lesions of varying complexity, unchanged
when compared prior exam, dedicated abdominal MRI with and without
contrast could be performed for further characterization if
clinically warranted.
3. Cystic lesion of the uncinate process of the pancreas measuring
4.0 cm, likely a intraductal papillary mucinous neoplasm. Recommend
attention on follow-up surveillance imaging.
4. Aortic Atherosclerosis ([LR]-[LR]).

## 2022-04-20 MED ORDER — HYDROCODONE-ACETAMINOPHEN 5-325 MG PO TABS: ORAL_TABLET | ORAL | 0 refills | Status: DC

## 2022-04-20 MED ORDER — ONDANSETRON HCL 4 MG PO TABS: 4.0000 mg | ORAL_TABLET | Freq: Four times a day (QID) | ORAL | 0 refills | Status: DC

## 2022-04-20 NOTE — ED Notes (Signed)
Patient transported to CT 

## 2022-04-20 NOTE — ED Triage Notes (Addendum)
BIB spouse by POV from home, here for low back pain, radiates to buttocks and legs/thighs, mentions recent anal radiation and chemo, pain worse with movement, no relief with tylenol, denies weakness, numbness or tingling. Ambulatory with walker. H/o ESRD, CAPD nightly. ?

## 2022-04-20 NOTE — ED Provider Notes (Signed)
?[facility] ?Provider Note ? ? ?CSN: [id number] ?Arrival date & time: 04/20/22  1452 ? ?  ? ?History ? ?Chief Complaint  ?Patient presents with  ? Back Pain  ? ? ?Jason Avila is a 88 y.o. male. ? ? ?Back Pain ?Associated symptoms: no chest pain, no fever, no headaches, no numbness and no weakness   ? ?  ? ? ?Jason Avila is a 88 y.o. male with past medical history of hypertension, chronic kidney disease, iron deficiency anemia, and performs at home peritoneal dialysis, aortic valve stenosis, carcinoma of the glottis and anal cancer who presents to the Emergency Department complaining of low back pain x2 weeks.  He recently completed radiation and chemotherapy for his anal cancer.  He developed pain across his mid to lower back 2 to 3 weeks ago.  He states he was using a garden tiller at the time.  He is unsure if he may have injured his back.  He has been trying over-the-counter therapies with heat and ice and Tylenol without relief.  He states the back pain is gradually worsening.  He is having worsening pain upon standing from a lying or seated position.  At times, he describes having pain into his buttocks and upper thighs.  He denies any weakness, numbness, or tingling of his legs.  He is ambulatory with a walker at baseline.  He notes having some incontinence of stool since completing his chemotherapy and that his stools have been black.  He is also taking a once monthly injection for his anemia.  Denies any syncope or dizziness upon standing.  No chest pain or shortness of breath. ? ? ?Home Medications ?Prior to Admission medications   ?Medication Sig Start Date End Date Taking? Authorizing Provider  ?atorvastatin (LIPITOR) 20 MG tablet Take 20 mg by mouth daily at 6 PM.  07/22/14   [provider]  ?B12-ACTIVE 1 MG CHEW Chew 1 tablet by mouth daily. 01/11/22   [provider]  ?calcium acetate (PHOSLO) 667 MG tablet Take 667 mg by mouth 3 (three) times daily. 08/30/21    [provider]  ?Cholecalciferol 25 MCG (1000 UT) capsule Take 1,000 Units by mouth daily. 08/18/21   [provider]  ?dicyclomine (BENTYL) 10 MG capsule Take 1 capsule (10 mg total) by mouth 4 (four) times daily -  before meals and at bedtime. 03/05/22   Derek Jack, MD  ?docusate sodium (COLACE) 100 MG capsule Take 100 mg by mouth 2 (two) times daily.    [provider]  ?epoetin alfa (EPOGEN) 10000 UNIT/ML injection Inject 1 Units into the skin every 14 (fourteen) days.    [provider]  ?hydrALAZINE (APRESOLINE) 100 MG tablet Take 100 mg by mouth once. 08/18/21   [provider]  ?levothyroxine (SYNTHROID) 25 MCG tablet Take 50 mcg by mouth every morning.    [provider]  ?lidocaine-prilocaine (EMLA) cream Apply a small amount to port a cath site and cover with plastic wrap 1 hour prior to infusion appointments ?Patient not taking: Reported on 02/26/2022 01/05/22   Derek Jack, MD  ?magnesium oxide (MAG-OX) 400 (240 Mg) MG tablet Take 1 tablet (400 mg total) by mouth 2 (two) times daily. 02/12/22   Derek Jack, MD  ?Methoxy PEG-Epoetin Beta (MIRCERA IJ) Inject into the skin. 02/19/22   [provider]  ?mirtazapine (REMERON) 15 MG tablet Take 15 mg by mouth daily. 11/30/21   [provider]  ?pantoprazole (PROTONIX) 40 MG tablet Take  40 mg by mouth every morning. 12/09/21   [provider]  ?prochlorperazine (COMPAZINE) 10 MG tablet Take 1 tablet (10 mg total) by mouth every 6 (six) hours as needed (Nausea or vomiting). ?Patient not taking: Reported on 02/26/2022 01/05/22   Derek Jack, MD  ?sodium bicarbonate 650 MG tablet SMARTSIG:1 Tablet(s) By Mouth 12/13/21   [provider]  ?sucroferric oxyhydroxide (VELPHORO) 500 MG chewable tablet Chew 500 tablets by mouth daily. 09/07/21   [provider]  ?   ? ?Allergies    ?Patient has no known allergies.   ? ?Review of Systems   ?Review  of Systems  ?Constitutional:  Negative for appetite change, chills, fatigue and fever.  ?Respiratory:  Negative for chest tightness and shortness of breath.   ?Cardiovascular:  Negative for chest pain and leg swelling.  ?Gastrointestinal:  Positive for diarrhea. Negative for abdominal distention, nausea and vomiting.  ?     Black colored stools  ?Genitourinary:  Negative for decreased urine volume and difficulty urinating.  ?Musculoskeletal:  Positive for back pain.  ?Skin:  Negative for color change and wound.  ?Neurological:  Negative for dizziness, syncope, weakness, numbness and headaches.  ?All other systems reviewed and are negative. ? ?Physical Exam ?Updated Vital Signs ?BP (!) 153/81 (BP Location: Right Arm)   Pulse 82   Temp 98.2 ?F (36.8 ?C) (Oral)   Resp 18   Ht '5\' 11"'$  (1.803 m)   Wt 72.1 kg   SpO2 100%   BMI 22.18 kg/m?  ?Physical Exam ?Vitals and nursing note reviewed. Exam conducted with a chaperone present.  ?Constitutional:   ?   General: He is not in acute distress. ?   Appearance: Normal appearance. He is not toxic-appearing.  ?HENT:  ?   Mouth/Throat:  ?   Mouth: Mucous membranes are moist.  ?   Pharynx: Oropharynx is clear.  ?Eyes:  ?   General: No scleral icterus. ?   Conjunctiva/sclera: Conjunctivae normal.  ?   Pupils: Pupils are equal, round, and reactive to light.  ?Cardiovascular:  ?   Rate and Rhythm: Normal rate.  ?   Heart sounds: Murmur heard.  ?Pulmonary:  ?   Effort: Pulmonary effort is normal.  ?   Breath sounds: Normal breath sounds.  ?Abdominal:  ?   Palpations: Abdomen is soft.  ?   Tenderness: There is no abdominal tenderness.  ?Genitourinary: ?   Rectum: Guaiac result negative. No tenderness.  ?   Comments: Black, heme negative stool, rectal wall feels firm.  Palpable stool present.   ?Musculoskeletal:     ?   General: Normal range of motion.  ?   Right lower leg: No edema.  ?   Left lower leg: No edema.  ?   Comments: Diffuse tenderness to palpation to bilateral lower  lumbar paraspinal muscles and tenderness along the lower lumbar spine.  No bony step-offs.  Hip flexors extensors intact.  ?Skin: ?   General: Skin is warm.  ?   Capillary Refill: Capillary refill takes less than 2 seconds.  ?   Findings: No rash.  ?Neurological:  ?   General: No focal deficit present.  ?   Mental Status: He is alert.  ?   Sensory: No sensory deficit.  ?   Motor: No weakness.  ? ? ?ED Results / Procedures / Treatments   ?Labs ?(all labs ordered are listed, but only abnormal results are displayed) ?Labs Reviewed  ?CBC WITH DIFFERENTIAL/PLATELET - Abnormal; Notable for  the following components:  ?    Result Value  ? WBC 3.2 (*)   ? RBC 2.95 (*)   ? Hemoglobin 9.2 (*)   ? HCT 29.9 (*)   ? MCV 101.4 (*)   ? Lymphs Abs 0.3 (*)   ? All other components within normal limits  ?URINALYSIS, ROUTINE W REFLEX MICROSCOPIC - Abnormal; Notable for the following components:  ? Color, Urine STRAW (*)   ? Glucose, UA 50 (*)   ? Protein, ur 30 (*)   ? All other components within normal limits  ?COMPREHENSIVE METABOLIC PANEL  ?POC OCCULT BLOOD, ED  ? ? ?EKG ?None ? ?Radiology ?CT ABDOMEN PELVIS WO CONTRAST ? ?Result Date: 04/20/2022 ?CLINICAL DATA:  Abdominal pain EXAM: CT ABDOMEN AND PELVIS WITHOUT CONTRAST TECHNIQUE: Multidetector CT imaging of the abdomen and pelvis was performed following the standard protocol without IV contrast. RADIATION DOSE REDUCTION: This exam was performed according to the departmental dose-optimization program which includes automated exposure control, adjustment of the mA and/or kV according to patient size and/or use of iterative reconstruction technique. COMPARISON:  PET-CT dated December 21, 2021 FINDINGS: Lower chest: No acute abnormality. Hepatobiliary: No focal liver abnormality is seen. No gallstones, gallbladder wall thickening, or biliary dilatation. Pancreas: Cystic lesion of the uncinate process measuring 4.0 cm on series 5, image 39. No pancreatic ductal dilatation or surrounding  inflammatory changes. Spleen: Normal in size without focal abnormality. Adrenals/Urinary Tract: Bilateral adrenal glands are unremarkable. Numerous bilateral renal cysts varying complexity. No hydronephrosis. Stomach/B

## 2022-04-20 NOTE — ED Notes (Signed)
ED Provider at bedside. 

## 2022-04-20 NOTE — Discharge Instructions (Signed)
You may apply over-the-counter 4% lidocaine patches to your back.  You do not need a prescription to purchase these.  You may take the pain medication as directed.  Please follow-up with your primary care provider or with Dr. Delton Coombes for recheck.  Return emergency department for any new or worsening symptoms. ?

## 2022-04-30 ENCOUNTER — Emergency Department (HOSPITAL_COMMUNITY): Payer: Medicare Other

## 2022-04-30 ENCOUNTER — Emergency Department (HOSPITAL_COMMUNITY)
Admission: EM | Admit: 2022-04-30 | Discharge: 2022-04-30 | Disposition: A | Discharge status: Home / Self Care | Payer: Medicare Other | Attending: Emergency Medicine | Admitting: Emergency Medicine

## 2022-04-30 ENCOUNTER — Encounter (HOSPITAL_COMMUNITY): Payer: Self-pay

## 2022-04-30 ENCOUNTER — Other Ambulatory Visit: Payer: Self-pay

## 2022-04-30 DIAGNOSIS — M8448XA Pathological fracture, other site, initial encounter for fracture: Secondary | ICD-10-CM

## 2022-04-30 DIAGNOSIS — N189 Chronic kidney disease, unspecified: Secondary | ICD-10-CM | POA: Diagnosis not present

## 2022-04-30 DIAGNOSIS — S34139A Unspecified injury to sacral spinal cord, initial encounter: Secondary | ICD-10-CM | POA: Diagnosis present

## 2022-04-30 DIAGNOSIS — Z85048 Personal history of other malignant neoplasm of rectum, rectosigmoid junction, and anus: Secondary | ICD-10-CM | POA: Insufficient documentation

## 2022-04-30 DIAGNOSIS — M4848XA Fatigue fracture of vertebra, sacral and sacrococcygeal region, initial encounter for fracture: Secondary | ICD-10-CM | POA: Diagnosis not present

## 2022-04-30 DIAGNOSIS — X58XXXA Exposure to other specified factors, initial encounter: Secondary | ICD-10-CM | POA: Insufficient documentation

## 2022-04-30 DIAGNOSIS — I129 Hypertensive chronic kidney disease with stage 1 through stage 4 chronic kidney disease, or unspecified chronic kidney disease: Secondary | ICD-10-CM | POA: Diagnosis not present

## 2022-04-30 IMAGING — DX DG SACRUM/COCCYX 2+V
3 series · 3 of 3 positions shown · non-contrast
Comparison: Same-day MRI of the pelvis

CLINICAL DATA: Sacral ala fractures

EXAM:
SACRUM AND COCCYX - 2+ VIEW

[sacrum ap]
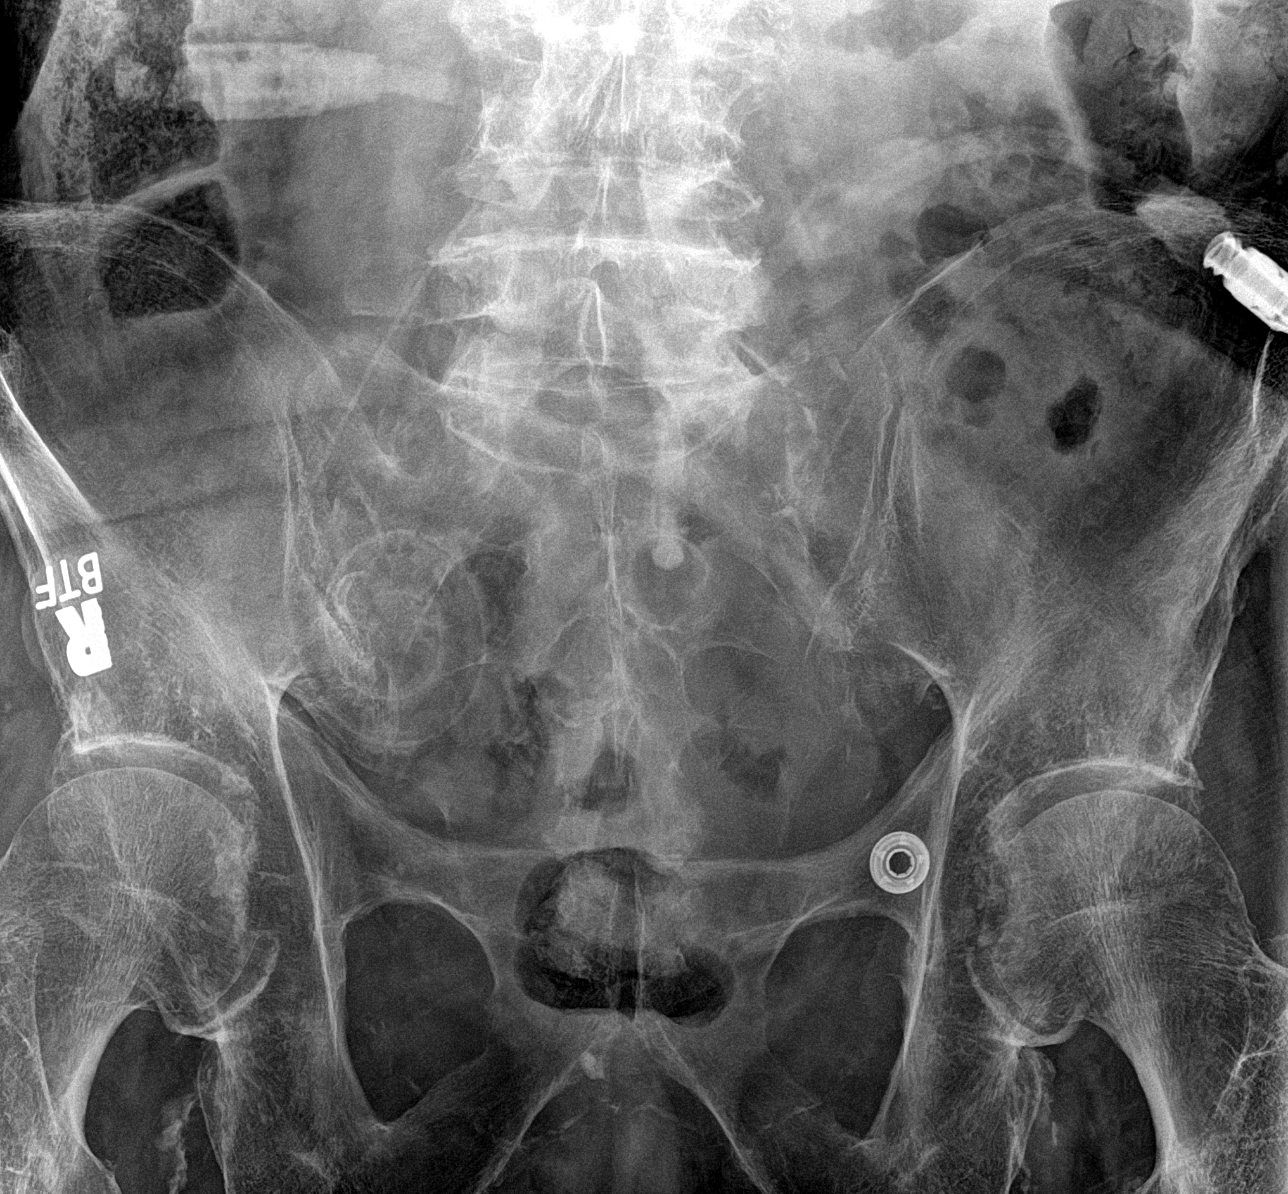

[coccyx ap]
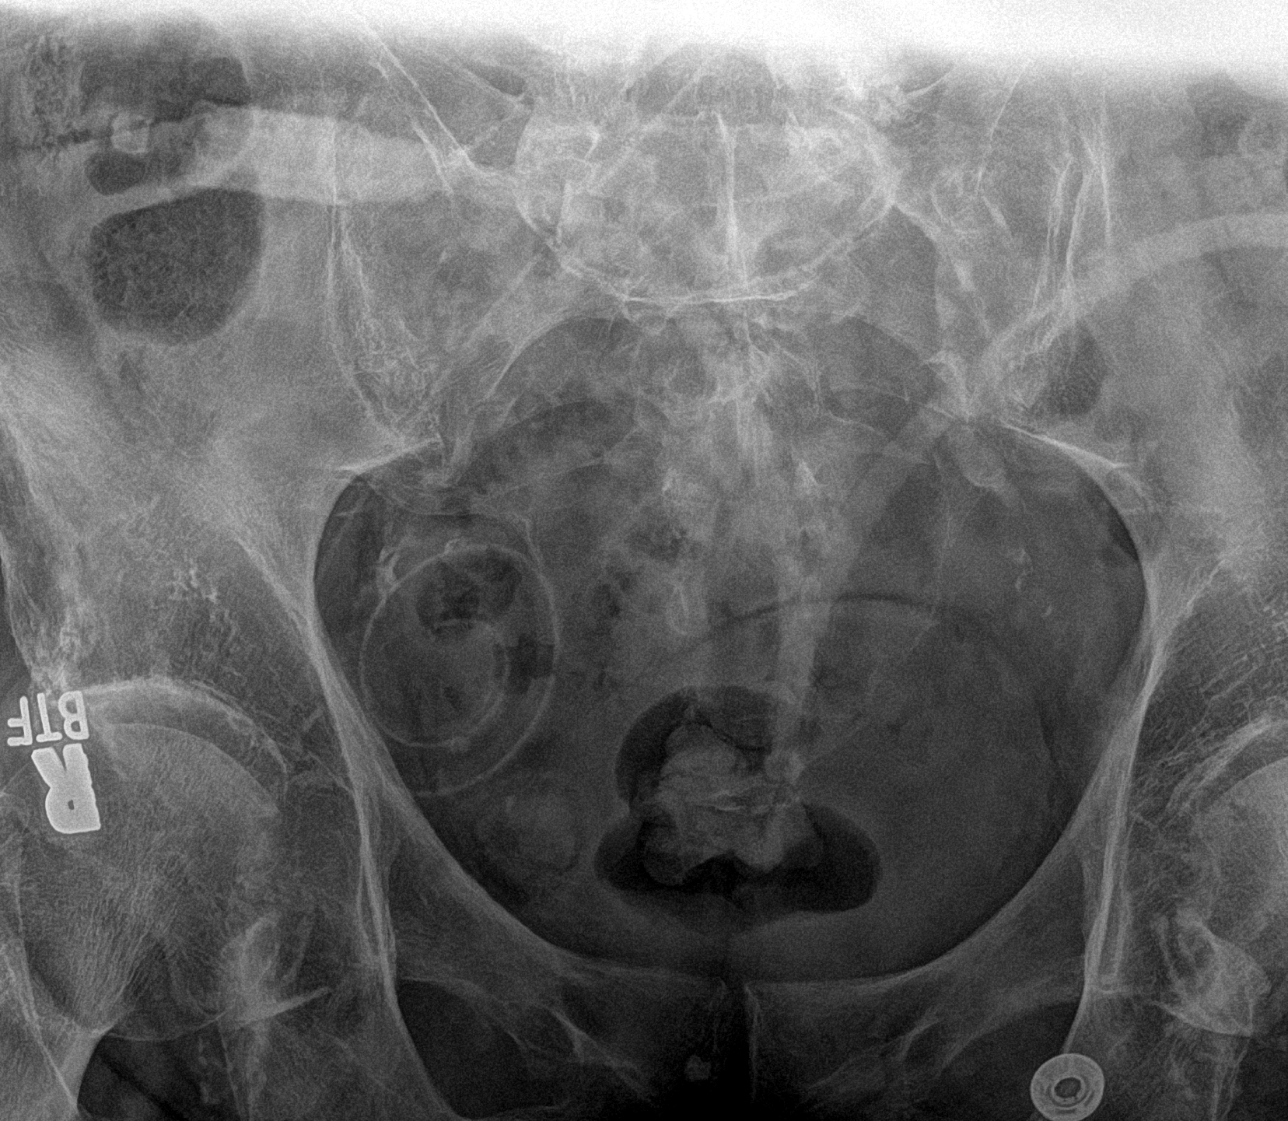

[sacrum lat]
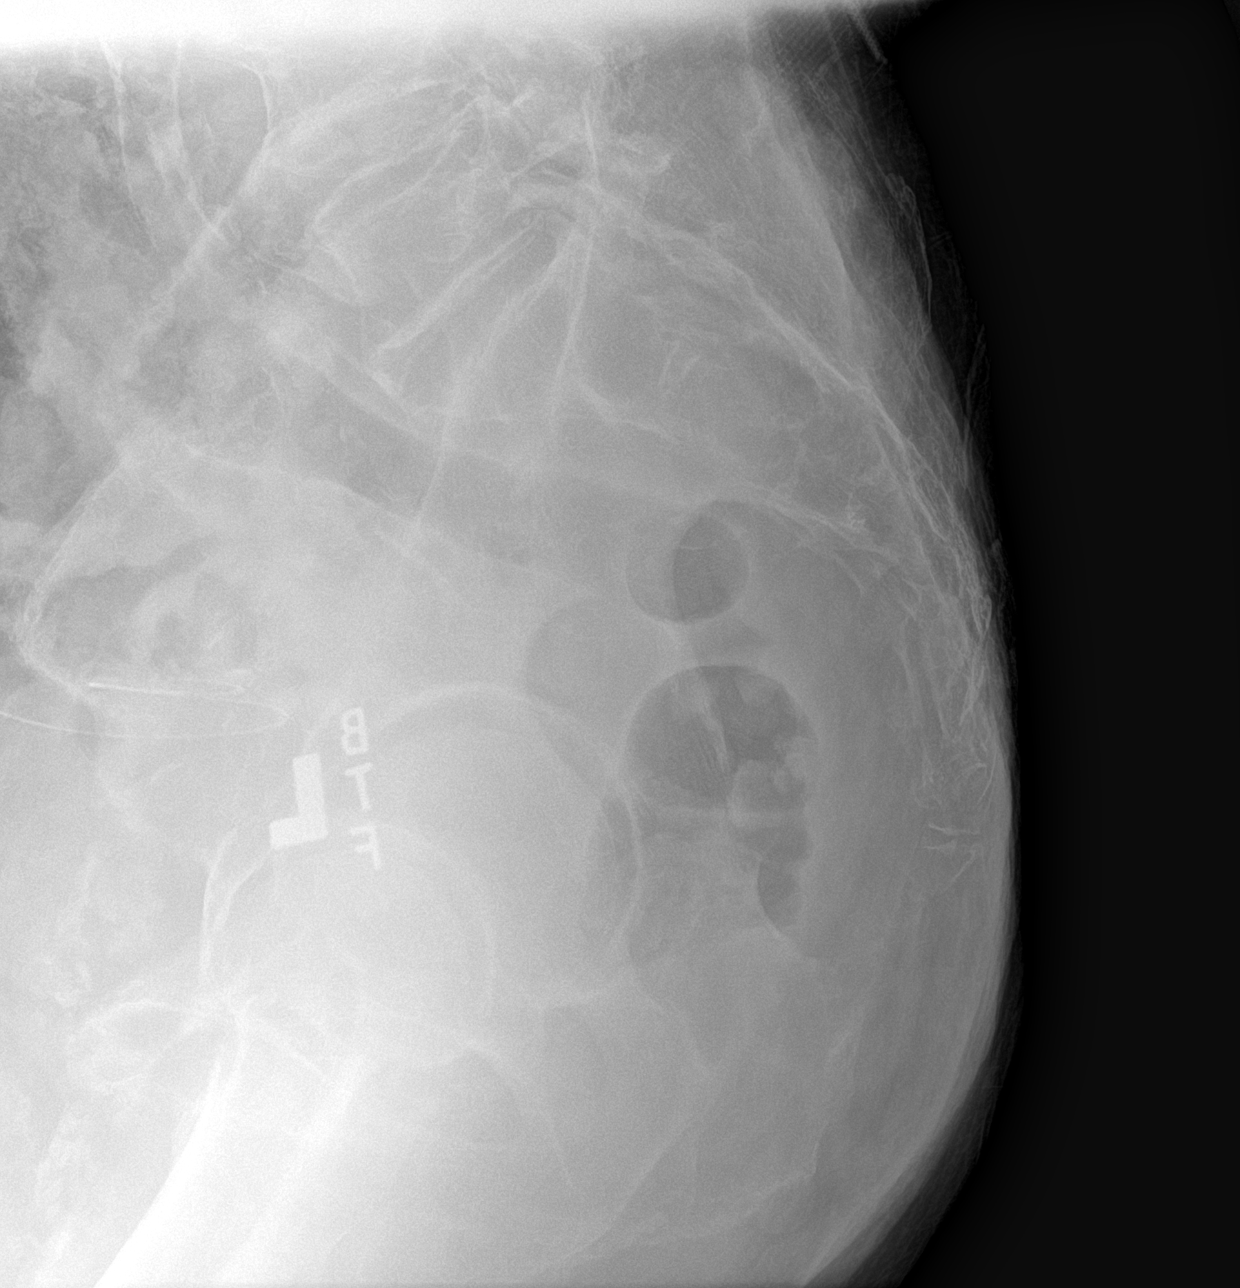

[3 of 3 positions shown; findings below may reference images not displayed]

FINDINGS: A fracture of the left sacral ala is seen, as seen on same day
pelvis MRI. The right sacral ala fracture is not well appreciated on
the current study. No other fracture is seen. The SI joints are
intact. Femoroacetabular alignment appears maintained. The symphysis
pubis is intact.
IMPRESSION: Left sacral ala fracture as seen on the same-day MRI of the pelvis.
The right sacral ala fracture is not well seen.

## 2022-04-30 IMAGING — MR MR PELVIS W/O CM
5 series · 45 of 48 positions shown · non-contrast
Comparison: CT abdomen pelvis dated [DATE].

CLINICAL DATA: Posterior pelvic pain after a fall. History of
metastatic rectal cancer.

EXAM:
MRI PELVIS WITHOUT CONTRAST
TECHNIQUE: Multiplanar multisequence MR imaging of the pelvis was performed. No
intravenous contrast was administered.

[Series 2: T1 · axial · 4.0mm · 1.19mm/px · z∈[-125,+170]mm · 10 of 60 slices shown (1 of 2)]
[im 1/60]
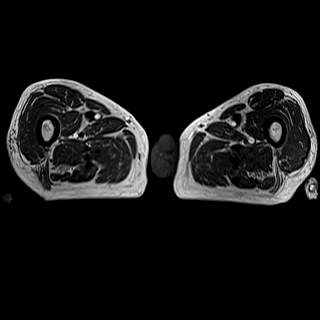
[im 7/60]
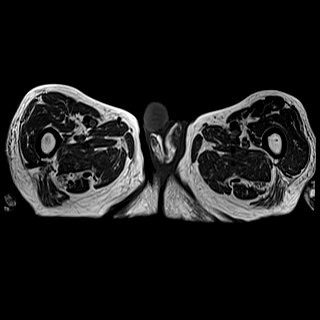
[im 14/60]
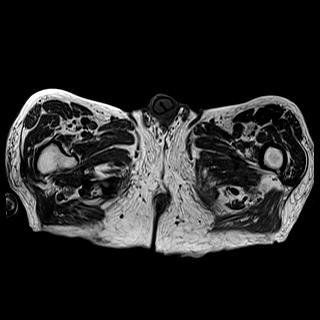
[im 20/60]
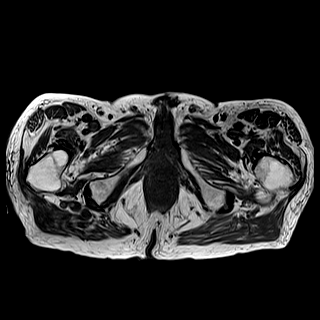
[im 27/60]
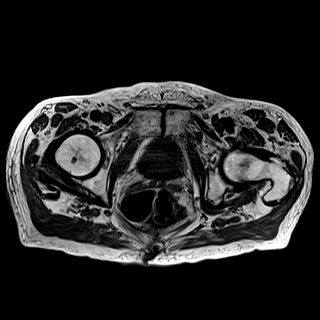
[im 33/60]
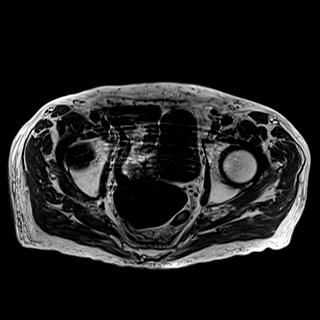
[im 40/60]
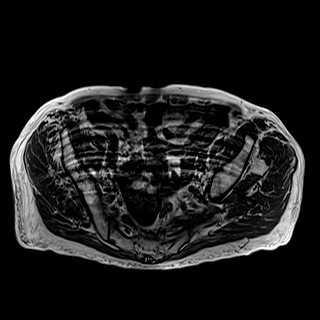
[im 46/60]
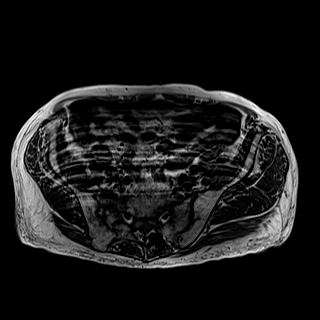
[im 53/60]
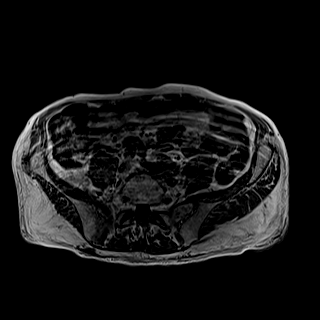
[im 60/60]
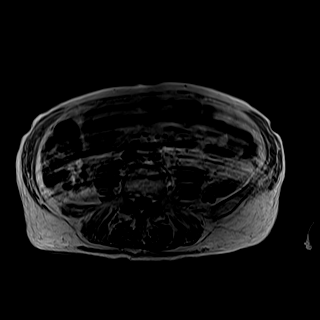

[Series 3: T2 fat-sat · axial · 4.0mm · 1.19mm/px · z∈[-125,+170]mm · 11 of 60 slices shown (1 of 2)]
[im 1/60]
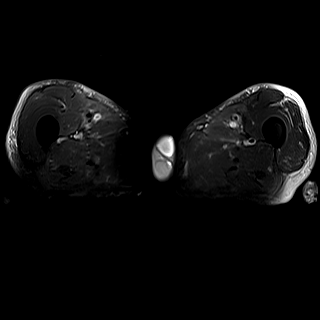
[im 6/60]
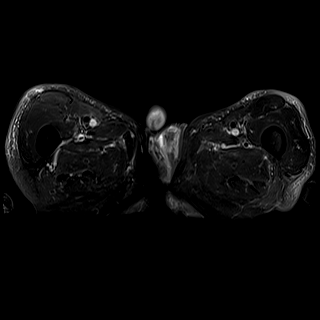
[im 12/60]
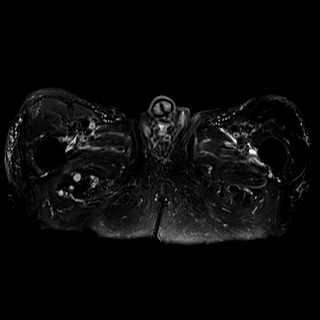
[im 18/60]
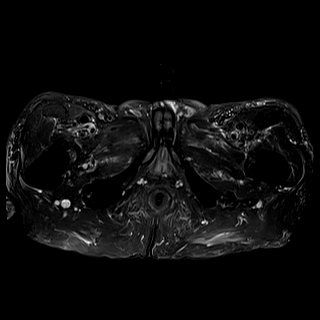
[im 24/60]
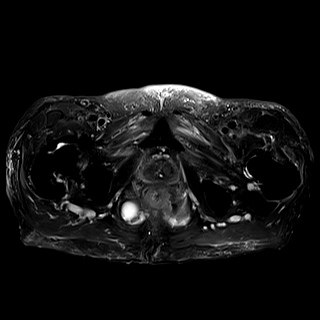
[im 30/60]
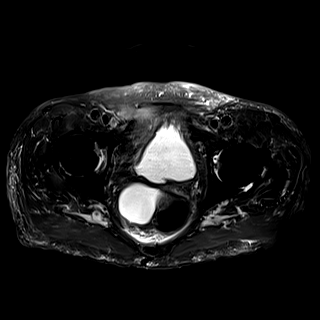
[im 36/60]
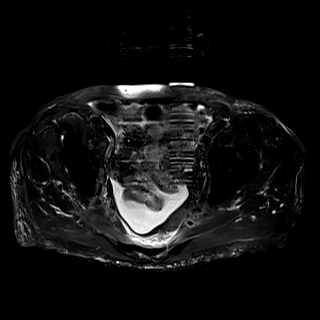
[im 42/60]
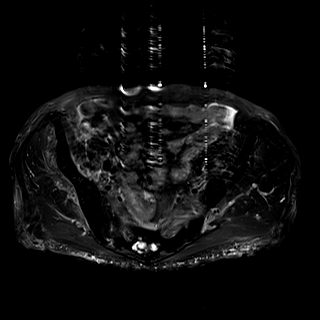
[im 48/60]
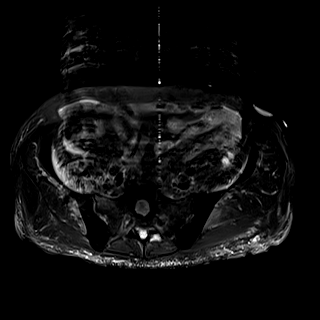
[im 54/60]
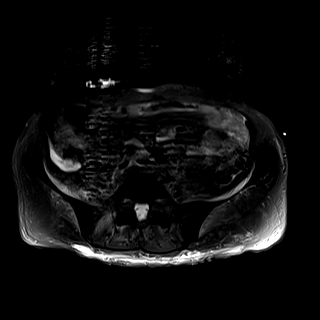
[im 60/60]
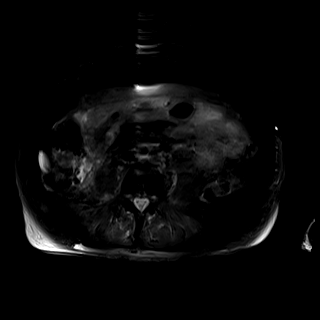

[Series 4: STIR · coronal · 4.0mm · 1.25mm/px · 7 of 40 slices shown]
[im 1/40]
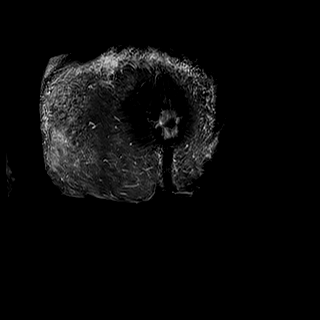
[im 7/40]
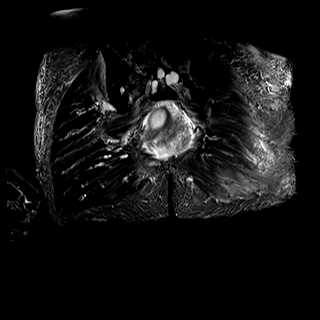
[im 14/40]
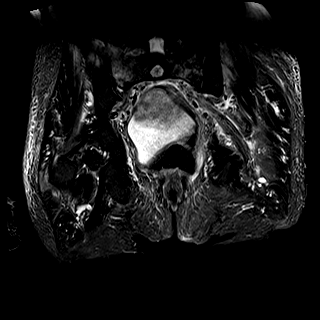
[im 20/40]
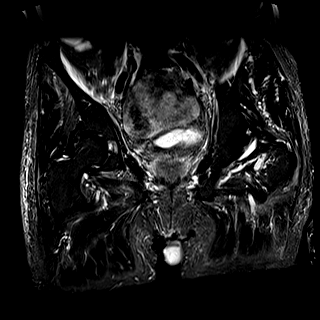
[im 27/40]
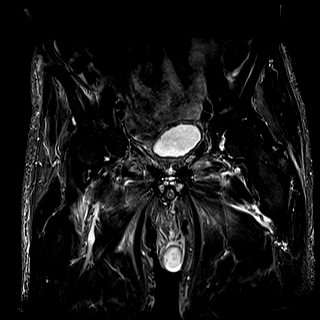
[im 33/40]
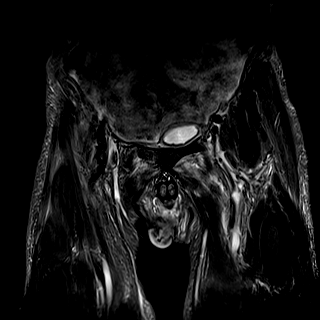
[im 40/40]
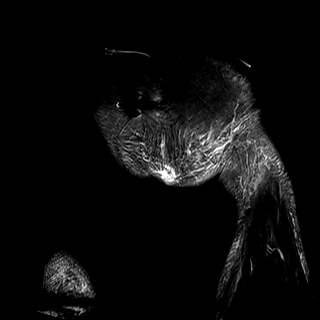

[Series 5: T1 · coronal · 4.0mm · 1.25mm/px · 7 of 40 slices shown (2 of 2)]
[im 1/40]
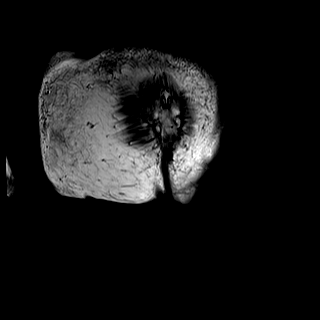
[im 7/40]
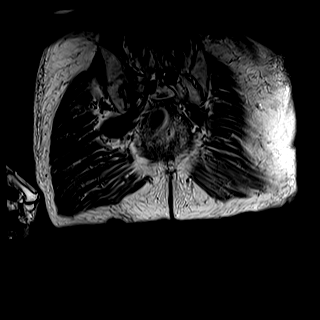
[im 14/40]
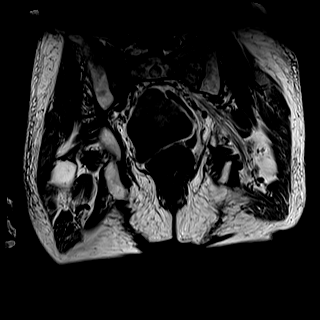
[im 20/40]
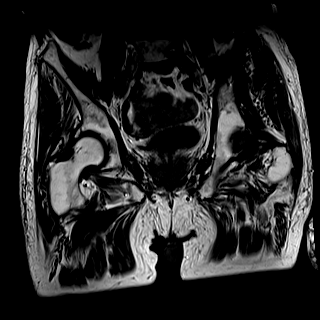
[im 27/40]
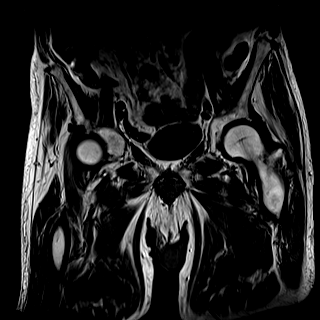
[im 33/40]
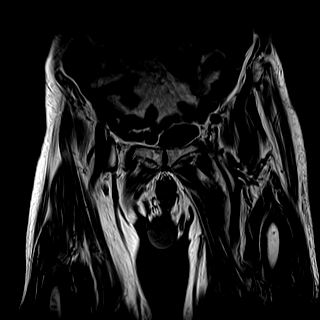
[im 40/40]
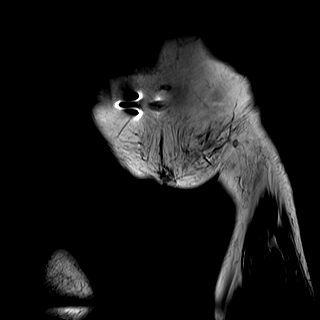

[Series 6: T2 fat-sat · sagittal · 4.0mm · 1.09mm/px · 10 of 72 slices shown (2 of 2)]
[im 1/72]
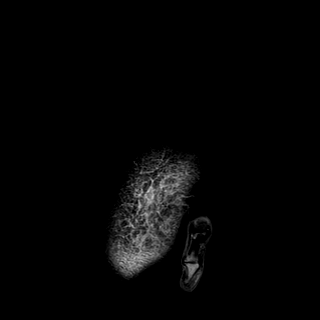
[im 6/72]
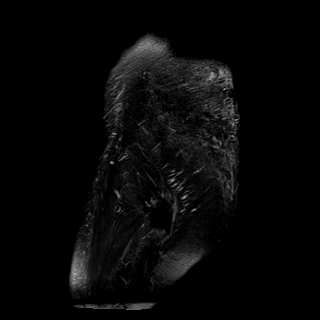
[im 12/72]
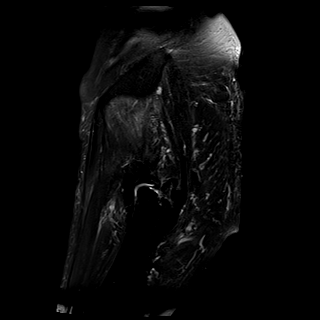
[im 18/72]
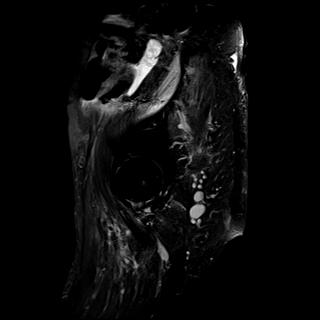
[im 24/72]
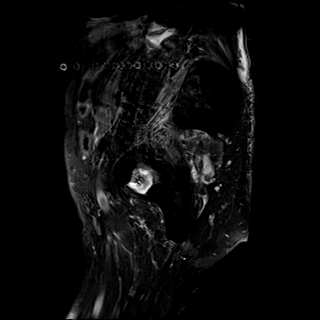
[im 30/72]
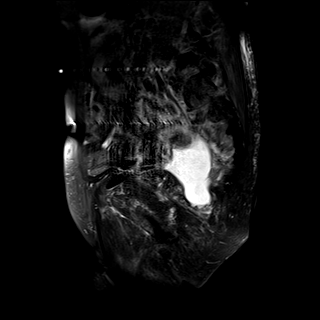
[im 42/72]
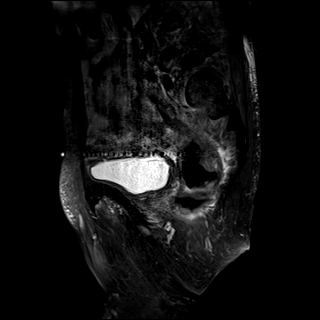
[im 48/72]
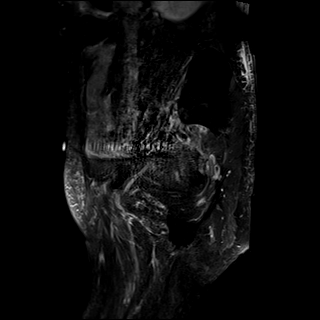
[im 60/72]
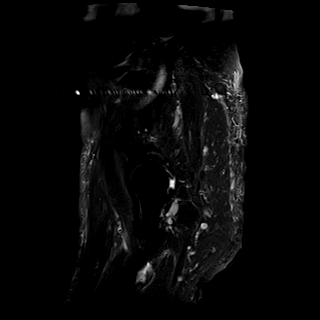
[im 72/72]
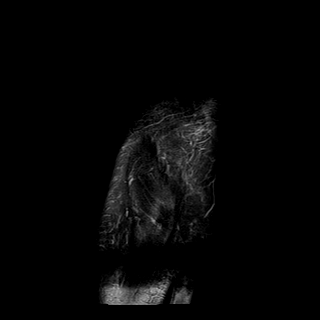

[45 of 48 positions shown; findings below may reference images not displayed]

FINDINGS: Bones: Subacute bilateral sacral ala insufficiency fractures with
associated marrow edema. 2.2 cm S1 hemangioma. No other focal bone
lesion. The visualized sacroiliac joints and symphysis pubis appear
normal.

Articular cartilage and labrum

Articular cartilage: Overall mild partial-thickness cartilage loss
in both hip joints with small marginal osteophytes.

Labrum: Both labrum are degenerated and frayed. Superimposed right
posterior labral tear with small paralabral cyst (series 3, image
34).

Joint or bursal effusion

Joint effusion: No significant hip joint effusion.

Bursae: No focal periarticular fluid collection.

Muscles and tendons

Muscles and tendons: The visualized gluteus, hamstring and iliopsoas
tendons appear normal.

Other findings

Miscellaneous: Small pelvic ascites. Unchanged irregular wall
thickening of the rectum. Mild presacral edema.
IMPRESSION: 1. Subacute bilateral sacral ala insufficiency fractures.
2. Mild bilateral hip osteoarthritis.
3. Unchanged irregular wall thickening of the rectum, consistent
with patient's known rectal cancer.

## 2022-04-30 IMAGING — MR MR LUMBAR SPINE W/O CM
5 series · 31 of 48 positions shown · non-contrast
Comparison: CT [DATE].  PET-CT [DATE]

CLINICAL DATA: Back pain after fall.  History of anal cancer

EXAM:
MRI LUMBAR SPINE WITHOUT CONTRAST
TECHNIQUE: Multiplanar, multisequence MR imaging of the lumbar spine was
performed. No intravenous contrast was administered.

[Series 9: T2 · sagittal · 4.0mm · 0.88mm/px · 6 of 15 slices shown (1 of 2)]
[im 1/15]
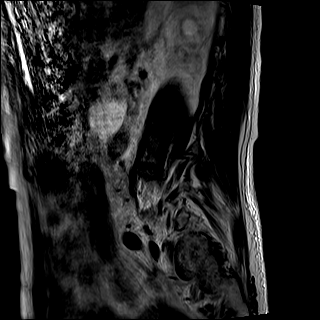
[im 3/15]
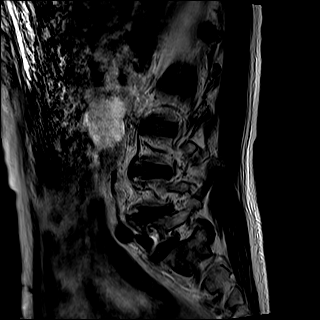
[im 6/15]
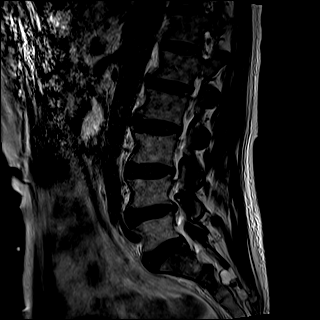
[im 9/15]
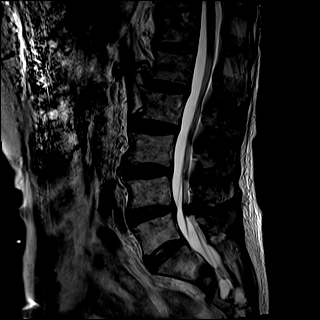
[im 12/15]
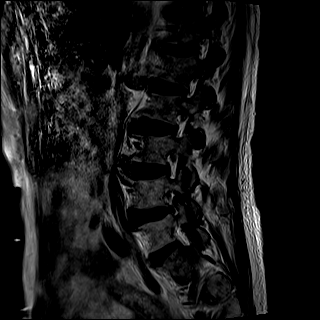
[im 15/15]
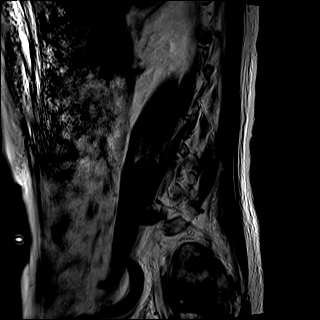

[Series 10: STIR · sagittal · 4.0mm · 0.55mm/px · 2 of 15 slices shown]
[im 1/15]
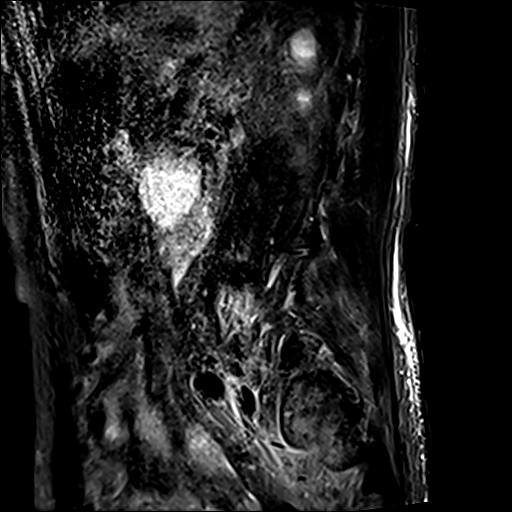
[im 3/15]
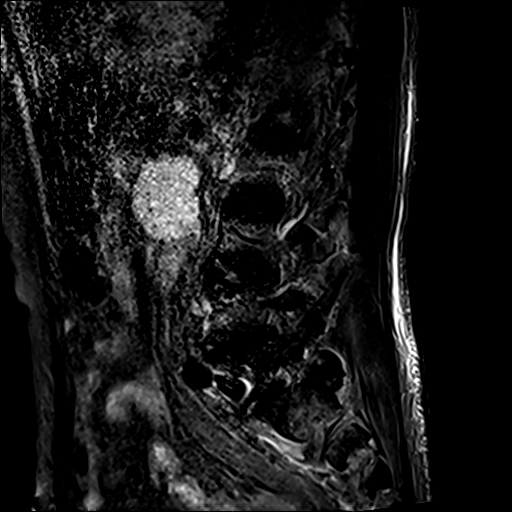

[Series 11: T1 · sagittal · 4.0mm · 1.09mm/px · 7 of 15 slices shown (1 of 2)]
[im 1/15]
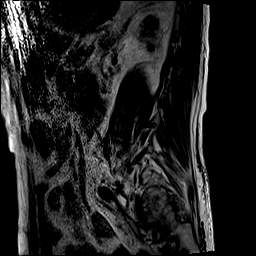
[im 3/15]
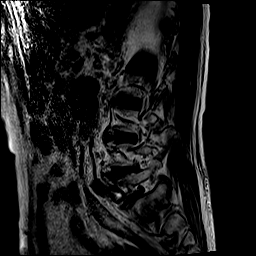
[im 5/15]
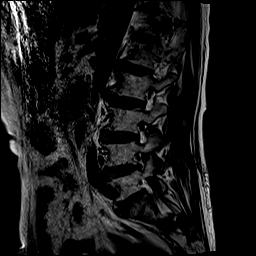
[im 8/15]
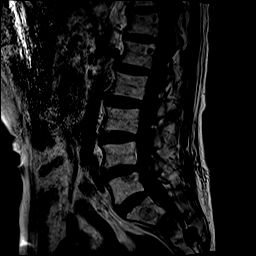
[im 10/15]
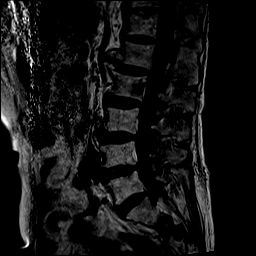
[im 12/15]
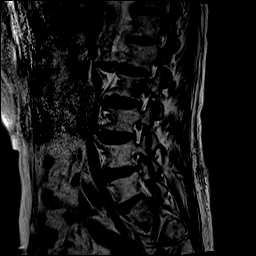
[im 15/15]
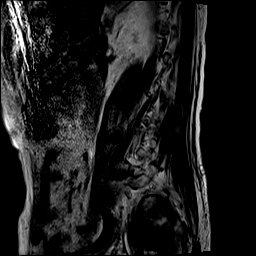

[Series 12: T2 · axial · 4.0mm · 0.70mm/px · z∈[+65,+250]mm · 8 of 29 slices shown (2 of 2)]
[im 1/29]
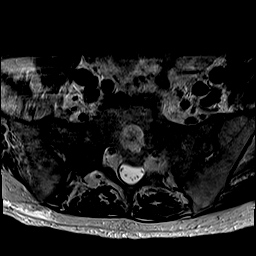
[im 5/29]
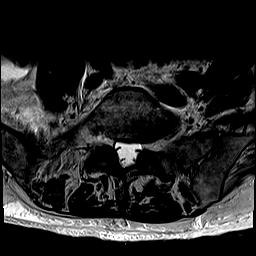
[im 9/29]
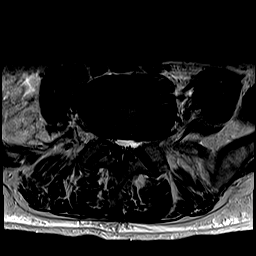
[im 13/29]
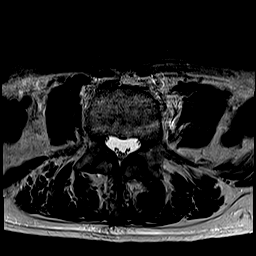
[im 16/29]
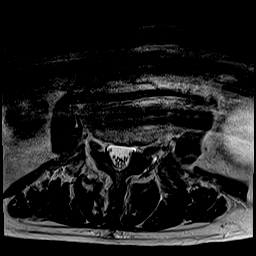
[im 20/29]
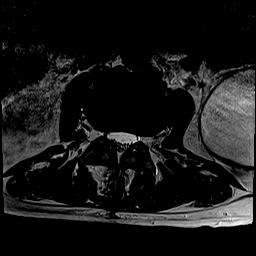
[im 24/29]
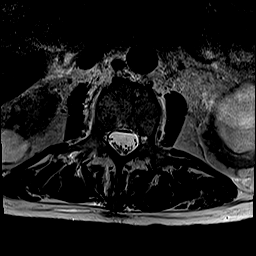
[im 29/29]
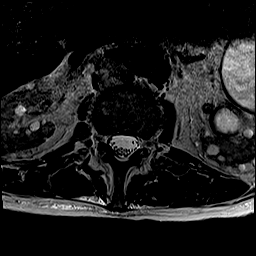

[Series 13: T1 · axial · 4.0mm · 0.35mm/px · z∈[+65,+250]mm · 8 of 29 slices shown (2 of 2)]
[im 1/29]
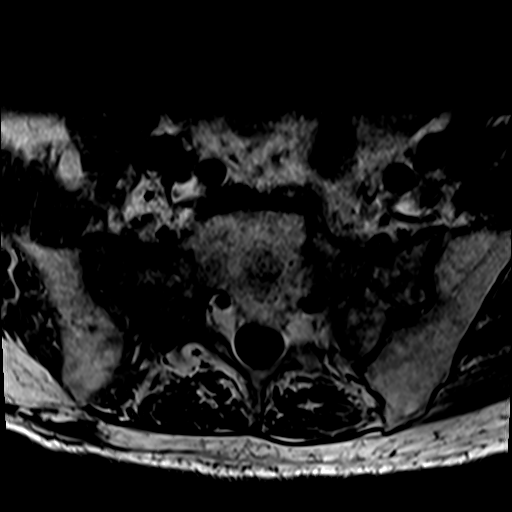
[im 5/29]
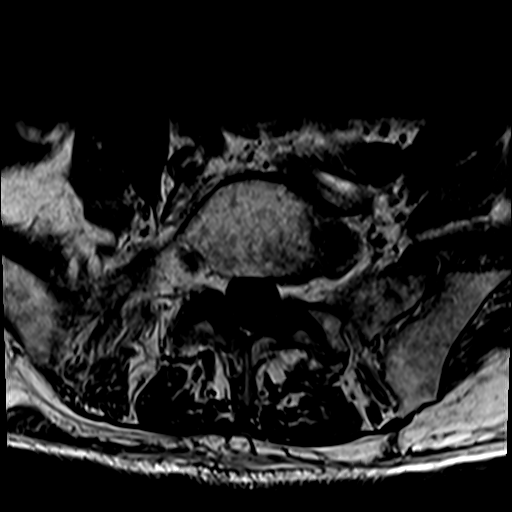
[im 9/29]
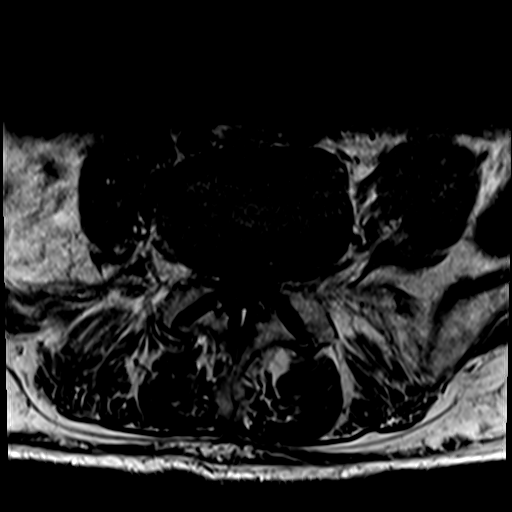
[im 13/29]
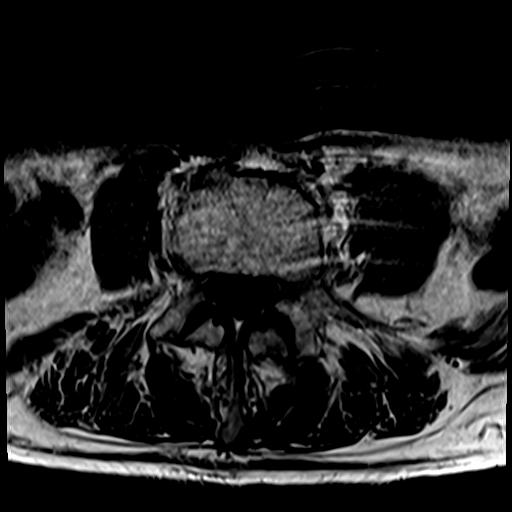
[im 16/29]
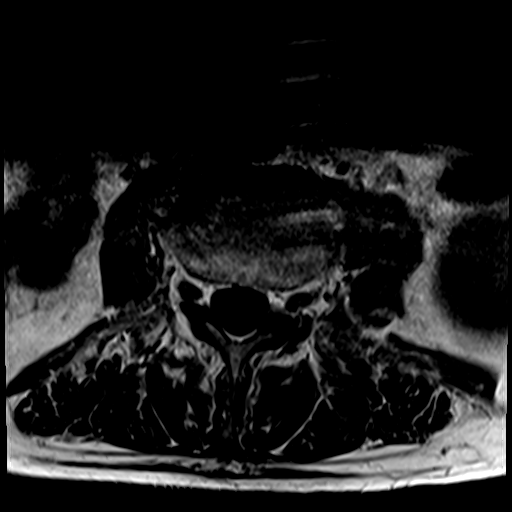
[im 20/29]
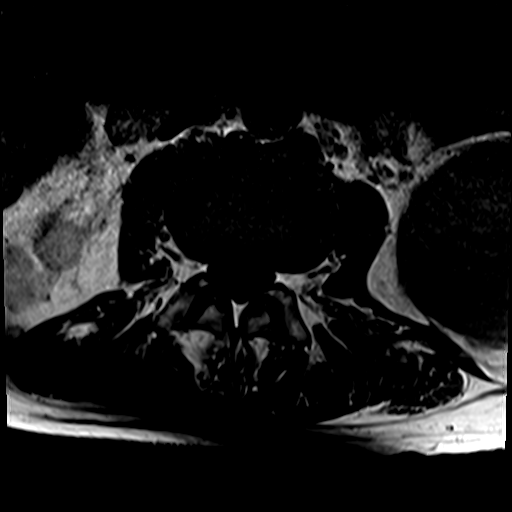
[im 24/29]
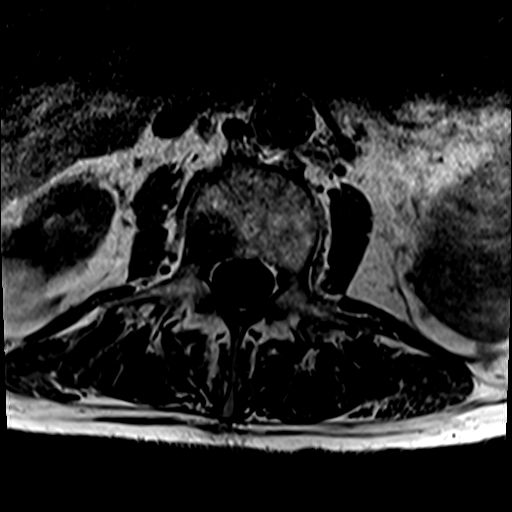
[im 29/29]
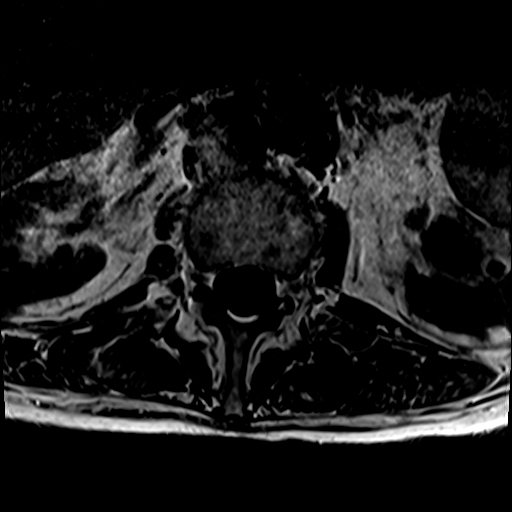

[31 of 48 positions shown; findings below may reference images not displayed]

FINDINGS: Segmentation:  Standard.

Alignment:  Physiologic.

Vertebrae: Partially visualized bilateral sacral alar fractures with
associated bone marrow edema, which will be further characterized on
concurrently obtained MRI of the pelvis. Lumbar vertebral body
heights are maintained. Mild focal marrow edema at the superior
endplate of L5 favors discogenic endplate marrow changes and
possible developing Schmorl's node. Subtle fracture at this location
not entirely excluded. Otherwise, no evidence of acute fracture of
the lumbar spine. Previously described subacute fracture of the left
L1 transverse process is not included within the field of view. No
evidence of discitis. Intraosseous hemangioma within the S1 segment.
Additional lesion within the posterior aspect of the L2 vertebral
body on the right likely represents a atypical hemangioma. No
metabolic activity was seen within this location on previous PET-CT.
No definite marrow replacing bone lesion is identified.

Conus medullaris and cauda equina: Conus extends to the T12-L1
level. Conus and cauda equina appear normal.

Paraspinal and other soft tissues: Cortically based T2 hyperintense
lesionswithin the bilateral kidneys, incompletely characterized, but
most likely represent cysts.

Disc levels:

T12-L1: Sagittal sequences only.  Unremarkable.

L1-L2: Minimal disc bulge.  No foraminal or canal stenosis.

L2-L3: Minimal annular disc bulge. Mild bilateral facet hypertrophy.
No foraminal or canal stenosis.

L3-L4: Minimal annular disc bulge. Mild bilateral facet hypertrophy.
No foraminal or canal stenosis.

L4-L5: Disc height loss with mild annular disc bulge and endplate
ridging. Mild bilateral facet arthropathy with ligamentum flavum
buckling. Mild canal stenosis with mild bilateral subarticular
recess stenosis and mild bilateral foraminal stenosis.

L5-S1: No disc protrusion. Mild bilateral facet arthropathy. No
foraminal or canal stenosis.
IMPRESSION: 1. Partially visualized bilateral sacral alar fractures with
associated bone marrow edema, which will be further characterized on
concurrently obtained MRI of the pelvis.
2. Mild focal marrow edema at the superior endplate of L5 favors
discogenic endplate marrow changes. Subtle fracture at this location
not entirely excluded. Otherwise, no evidence of acute fracture of
the lumbar spine. Previously described subacute fracture of the left
L1 transverse process is not included within the field of view.
3. Mild lumbar spondylosis most notable at the L4-5 level where
there is mild canal stenosis, mild bilateral subarticular recess
stenosis, and mild bilateral foraminal stenosis.
4. No definite evidence of osseous metastatic disease. Previously
seen bone lesion in the L2 vertebral body favors a atypical
hemangioma. This was noted to be not hypermetabolic on previous CT.

## 2022-04-30 MED ORDER — HYDROCODONE-ACETAMINOPHEN 5-325 MG PO TABS: 1.0000 | ORAL_TABLET | ORAL | 0 refills | Status: DC | PRN

## 2022-04-30 MED ORDER — HYDROCODONE-ACETAMINOPHEN 5-325 MG PO TABS
1.0000 | ORAL_TABLET | Freq: Once | ORAL | Status: AC
  Administered 2022-04-30: 1 via ORAL
  Filled 2022-04-30: qty 1

## 2022-04-30 NOTE — Discharge Instructions (Addendum)
You were seen in the ER for evaluation of your continued worsening lower back pain. It was discovered that you have small fractures in your pelvis. I have spoken to the orthopedist who recommended weight bearing as tolerated, meaning that he should be using his walker everywhere he goes.  Please call to schedule a follow-up appointment with the orthopedic physician. I have presecribed you a few days of narcotic pain medication for your pain. I am limited with the amount I can prescribe out of the emergency department. Please do not drive or operate heavy machinery while on this medication. If you have any lose of control of your bladder or bowels, can not urinate, fever, worsening pain, weakness, numbness, or tingling, please return to the nearest ER for evaluation.  ? ?Contact a doctor if: ?Your pain does not get better with rest or medicine. ?Your pain gets worse, or you have new pain. ?You have a high fever. ?You lose weight very quickly. ?You have trouble doing your normal activities. ?Get help right away if: ?One or both of your legs or feet feel weak. ?One or both of your legs or feet lose feeling (have numbness). ?You have trouble controlling when you poop (have a bowel movement) or pee (urinate). ?You have bad back pain and: ?You feel like you may vomit (nauseous), or you vomit. ?You have pain in your belly (abdomen). ?You have shortness of breath. ?You faint. ?

## 2022-04-30 NOTE — ED Triage Notes (Signed)
Patient with complaints of back pain for over a month. He was seen and diagnosed with a lumbar fracture. Pain is getting worse.  ?

## 2022-04-30 NOTE — ED Notes (Signed)
Pt ambulated to the restroom.

## 2022-04-30 NOTE — ED Provider Notes (Addendum)
[facility] Provider Note   CSN: [id number] Arrival date & time: 04/30/22  1540     History Chief Complaint  Patient presents with   Back Pain    Jason Avila is a 88 y.o. male with history of anal cancer, CKD, hypertension, presents the emergency department for evaluation of gradually worsening lower back pain for the past month.  Patient was seen here previously 10 days ago and had some mildly of the oxycodone he was given, but he reports it still been worsening.  He previously had some rectal incontinence but has not had any in the past 1-1/2 weeks.  He reports that this is from his anal cancer and has been going to therapy for this and has been improving.  He reports that he had therapy on Friday and thinks that he may have overdid it which causes worsening of pain.  He denies any current fecal or urinary incontinence.  Denies any urinary retention.  Denies any dysuria or hematuria.  Denies any melena or hematochezia.  Denies any abdominal pain, nausea, or vomiting.  Denies any chest pain or shortness of breath.  Denies any recent falls, although the patient reports he did fall in early April and finished his chemo and radiation in late March.  Denies any numbness or tingling.  Denies any weakness.  His wife reports that he mainly has the pain with change of position, once he is walking with his walker, he is fine.  She has not noticed any changes within his gait recently.   Back Pain Associated symptoms: no chest pain, no dysuria, no fever, no numbness and no weakness       Home Medications Prior to Admission medications   Medication Sig Start Date End Date Taking? Authorizing Provider  atorvastatin (LIPITOR) 20 MG tablet Take 20 mg by mouth daily at 6 PM.  07/22/14   [provider]  B12-ACTIVE 1 MG CHEW Chew 1 tablet by mouth daily. 01/11/22   [provider]  calcium acetate (PHOSLO) 667 MG tablet Take 667 mg by mouth 3 (three) times daily.  08/30/21   [provider]  Cholecalciferol 25 MCG (1000 UT) capsule Take 1,000 Units by mouth daily. 08/18/21   [provider]  dicyclomine (BENTYL) 10 MG capsule Take 1 capsule (10 mg total) by mouth 4 (four) times daily -  before meals and at bedtime. 03/05/22   Derek Jack, MD  docusate sodium (COLACE) 100 MG capsule Take 100 mg by mouth 2 (two) times daily.    [provider]  epoetin alfa (EPOGEN) 10000 UNIT/ML injection Inject 1 Units into the skin every 14 (fourteen) days.    [provider]  hydrALAZINE (APRESOLINE) 100 MG tablet Take 100 mg by mouth once. 08/18/21   [provider]  HYDROcodone-acetaminophen (NORCO/VICODIN) 5-325 MG tablet Take one tab po q 4 hrs prn pain 04/20/22   Triplett, Tammy, PA-C  levothyroxine (SYNTHROID) 25 MCG tablet Take 50 mcg by mouth every morning.    [provider]  lidocaine-prilocaine (EMLA) cream Apply a small amount to port a cath site and cover with plastic wrap 1 hour prior to infusion appointments Patient not taking: Reported on 02/26/2022 01/05/22   Derek Jack, MD  magnesium oxide (MAG-OX) 400 (240 Mg) MG tablet Take 1 tablet (400 mg total) by mouth 2 (two) times daily. 02/12/22   Derek Jack, MD  Methoxy PEG-Epoetin Beta (MIRCERA IJ) Inject into the skin. 02/19/22   [provider]  mirtazapine (REMERON) 15 MG tablet Take 15 mg by mouth daily. 11/30/21   [provider]  ondansetron (ZOFRAN) 4 MG tablet Take 1 tablet (4 mg total) by mouth every 6 (six) hours. 04/20/22   Triplett, Tammy, PA-C  pantoprazole (PROTONIX) 40 MG tablet Take 40 mg by mouth every morning. 12/09/21   [provider]  prochlorperazine (COMPAZINE) 10 MG tablet Take 1 tablet (10 mg total) by mouth every 6 (six) hours as needed (Nausea or vomiting). Patient not taking: Reported on 02/26/2022 01/05/22   Derek Jack, MD  sodium bicarbonate 650 MG tablet SMARTSIG:1 Tablet(s) By  Mouth 12/13/21   [provider]  sucroferric oxyhydroxide (VELPHORO) 500 MG chewable tablet Chew 500 tablets by mouth daily. 09/07/21   [provider]      Allergies    Patient has no known allergies.    Review of Systems   Review of Systems  Constitutional:  Negative for chills and fever.  Respiratory:  Negative for cough and shortness of breath.   Cardiovascular:  Negative for chest pain.  Genitourinary:  Negative for difficulty urinating, dysuria, enuresis, frequency, hematuria and urgency.  Musculoskeletal:  Positive for back pain. Negative for neck pain.  Neurological:  Negative for weakness and numbness.   Physical Exam Updated Vital Signs BP (!) 157/84 (BP Location: Right Arm)   Pulse 80   Temp 97.7 F (36.5 C) (Oral)   Resp 18   Ht '5\' 11"'$  (1.803 m)   Wt 72.6 kg   SpO2 97%   BMI 22.32 kg/m  Physical Exam Vitals and nursing note reviewed.  Constitutional:      General: He is not in acute distress.    Appearance: Normal appearance. He is not ill-appearing or toxic-appearing.  HENT:     Head: Normocephalic and atraumatic.  Eyes:     General: No scleral icterus. Cardiovascular:     Rate and Rhythm: Normal rate and regular rhythm.  Pulmonary:     Effort: Pulmonary effort is normal.     Breath sounds: Normal breath sounds.  Abdominal:     General: Bowel sounds are normal.     Palpations: Abdomen is soft.     Tenderness: There is no abdominal tenderness. There is no guarding or rebound.  Musculoskeletal:        General: Tenderness present. No deformity.     Cervical back: Normal range of motion.     Right lower leg: No edema.     Left lower leg: No edema.     Comments: Tenderness to his midline sacrum/upper mid buttocks bilaterally.  No overlying bruising noted.  No step-offs or deformities noted.  He does generalized weakness in his legs with pain against resistance, but it is symmetric.  He has good pulses and cap refill.  Compartments are soft.   He has no pain in his lower legs. Sensation is intact.  Skin:    General: Skin is warm and dry.  Neurological:     General: No focal deficit present.     Mental Status: He is alert. Mental status is at baseline.     Comments: Ambulatory with walker, patient's wife reports that this is his baseline.  Sensation intact to bilateral lower extremities.  Sensation in his gluteal intact.    ED Results / Procedures / Treatments   Labs (all labs ordered are listed, but only abnormal results are displayed) Labs Reviewed - No data to display  EKG None  Radiology MR LUMBAR SPINE [place]  Result Date: 04/30/2022 CLINICAL DATA:  Back pain after fall.  History of anal cancer EXAM: MRI LUMBAR SPINE WITHOUT CONTRAST TECHNIQUE: Multiplanar, multisequence MR imaging of the lumbar spine was performed. No intravenous contrast was administered. COMPARISON:  CT 04/20/2022.  PET-CT 12/21/2021 FINDINGS: Segmentation:  Standard. Alignment:  Physiologic. Vertebrae: Partially visualized bilateral sacral alar fractures with associated bone marrow edema, which will be further characterized on concurrently obtained MRI of the pelvis. Lumbar vertebral body heights are maintained. Mild focal marrow edema at the superior endplate of L5 favors discogenic endplate marrow changes and possible developing Schmorl's node. Subtle fracture at this location not entirely excluded. Otherwise, no evidence of acute fracture of the lumbar spine. Previously described subacute fracture of the left L1 transverse process is not included within the field of view. No evidence of discitis. Intraosseous hemangioma within the S1 segment. Additional lesion within the posterior aspect of the L2 vertebral body on the right likely represents a atypical hemangioma. No metabolic activity was seen within this location on previous PET-CT. No definite marrow replacing bone lesion is identified. Conus medullaris and cauda equina: Conus extends to the T12-L1  level. Conus and cauda equina appear normal. Paraspinal and other soft tissues: Cortically based T2 hyperintense lesionswithin the bilateral kidneys, incompletely characterized, but most likely represent cysts. Disc levels: T12-L1: Sagittal sequences only.  Unremarkable. L1-L2: Minimal disc bulge.  No foraminal or canal stenosis. L2-L3: Minimal annular disc bulge. Mild bilateral facet hypertrophy. No foraminal or canal stenosis. L3-L4: Minimal annular disc bulge. Mild bilateral facet hypertrophy. No foraminal or canal stenosis. L4-L5: Disc height loss with mild annular disc bulge and endplate ridging. Mild bilateral facet arthropathy with ligamentum flavum buckling. Mild canal stenosis with mild bilateral subarticular recess stenosis and mild bilateral foraminal stenosis. L5-S1: No disc protrusion. Mild bilateral facet arthropathy. No foraminal or canal stenosis. IMPRESSION: 1. Partially visualized bilateral sacral alar fractures with associated bone marrow edema, which will be further characterized on concurrently obtained MRI of the pelvis. 2. Mild focal marrow edema at the superior endplate of L5 favors discogenic endplate marrow changes. Subtle fracture at this location not entirely excluded. Otherwise, no evidence of acute fracture of the lumbar spine. Previously described subacute fracture of the left L1 transverse process is not included within the field of view. 3. Mild lumbar spondylosis most notable at the L4-5 level where there is mild canal stenosis, mild bilateral subarticular recess stenosis, and mild bilateral foraminal stenosis. 4. No definite evidence of osseous metastatic disease. Previously seen bone lesion in the L2 vertebral body favors a atypical hemangioma. This was noted to be not hypermetabolic on previous CT. Electronically Signed   By: Davina Poke D.O.   On: 04/30/2022 15:25   MR PELVIS WO CONTRAST  Result Date: 04/30/2022 CLINICAL DATA:  Posterior pelvic pain after a fall.  History of metastatic rectal cancer. EXAM: MRI PELVIS WITHOUT CONTRAST TECHNIQUE: Multiplanar multisequence MR imaging of the pelvis was performed. No intravenous contrast was administered. COMPARISON:  CT abdomen pelvis dated Apr 20, 2022. FINDINGS: Bones: Subacute bilateral sacral ala insufficiency fractures with associated marrow edema. 2.2 cm S1 hemangioma. No other focal bone lesion. The visualized sacroiliac joints and symphysis pubis appear normal. Articular cartilage and labrum Articular cartilage: Overall mild partial-thickness cartilage loss in both hip joints with small marginal osteophytes. Labrum: Both labrum are degenerated and frayed. Superimposed right posterior labral tear with small paralabral cyst (series 3, image 34). Joint or bursal effusion Joint effusion: No significant hip joint effusion. Bursae: No  focal periarticular fluid collection. Muscles and tendons Muscles and tendons: The visualized gluteus, hamstring and iliopsoas tendons appear normal. Other findings Miscellaneous: Small pelvic ascites. Unchanged irregular wall thickening of the rectum. Mild presacral edema. IMPRESSION: 1. Subacute bilateral sacral ala insufficiency fractures. 2. Mild bilateral hip osteoarthritis. 3. Unchanged irregular wall thickening of the rectum, consistent with patient's known rectal cancer. Electronically Signed   By: Titus Dubin M.D.   On: 04/30/2022 15:23    Procedures Procedures   Medications Ordered in ED Medications - No data to display  ED Course/ Medical Decision Making/ A&P                           Medical Decision Making Amount and/or Complexity of Data Reviewed Radiology: ordered.  Risk Prescription drug management.    88 year old male presents emergency department for evaluation of gradually worsening lower back pain for the past month.  Differential diagnosis includes is not limited to chronic back pain, compression fracture, sciatica, occult fracture, sprain, strain, cauda  equina, epidural abscess.  Vital signs showed mildly elevated blood pressure 157/84, afebrile, normal pulse rate, satting well on room air without any increased work of breathing.  Physical exam is pertinent for tenderness to his midline sacrum/upper mid buttocks bilaterally.  No overlying bruising noted.  No step-offs or deformities noted.  He does generalized weakness in his legs with pain against resistance, but it is symmetric.  He has good pulses and cap refill.  Compartments are soft.  He has no pain in his lower legs. Sensation is intact. Ambulatory with walker, patient's wife reports that this is his baseline.  Sensation intact to bilateral lower extremities.  Sensation in his gluteal intact.    Given the patient has had a CT scan within the past 10 days and he is complaining of worsening pain, given the location of his pain being that it is his lower midline near his buttocks, I am concerned for possible metastasis of his rectal cancer or hidden fracture given the patient's advanced age as well as chemoradiation to the area making the bones at higher risk for fracture.  I deem it necessary that the patient receive an MRI of his pelvis and lumbar to rule out any fracture or stenosis.  MRI pelvis shows subacute bilateral sacral alla insufficiency fractures with mild bilateral hip osteoarthritis.  There is unchanged irregular wall thickening of the rectum, consistent with patient's known rectal cancer.  MRI of the lumbar spine shows partially visualized bilateral sacral alar fractures with associated bone marrow edema, which will be further characterized concurrently obtain MRI of the pelvis.  There is mild focal marrow edema at the superior endplate of L5 that favors discogenic endplate marrow changes.  Subtle fracture at this location not entirely excluded.  Otherwise no evidence of any acute fracture of the lumbar spine.  Previously described subacute fracture at the left L1 transverse process is not  included within the field-of-view.  There is mild lumbar spondylosis mostly notable at the L4-L5 level where this is mild canal stenosis, mild bilateral subarticular recess stenosis and mild bilateral foraminal stenosis.  There is no definite evidence of osseous metastatic disease.  Previous evening bone lesion in the L2 vertebral body favors an atypical hemangioma.  This was not noted to be hypermetabolic on previous CT.  Consult placed out to orthopedics, spoke with Dr.Cairns. We discussed the MRI findings as including the bilateral alar insufficency fractures as well as the bone  marrow edema. He recommended that the patient can weight-bear as tolerated, however the patient should be walking with a walker where he goes.  He reports he has worsening pain he can follow-up with him in the office.    After Ortho consultation, patient is safe for discharge.  We will discharge patient with a few of the Vicodin for his breakthrough pain.  I recommended that they go ahead and schedule an appointment with Dr. Amedeo Kinsman to follow-up with him.  We discussed red flag symptoms of back pain as well as strict return precautions.  Patient and wife at bedside verbalized understanding and agreed to plan.  The patient is stable and being discharged home in good condition.  I discussed this case with my attending physician who cosigned this note including patient's presenting symptoms, physical exam, and planned diagnostics and interventions. Attending physician stated agreement with plan or made changes to plan which were implemented.   Final Clinical Impression(s) / ED Diagnoses Final diagnoses:  Bilateral sacral insufficiency fracture, initial encounter    Rx / DC Orders ED Discharge Orders          Ordered    HYDROcodone-acetaminophen (NORCO/VICODIN) 5-325 MG tablet  Every 4 hours PRN        04/30/22 1641              Sherrell Puller, PA-C 05/03/22 1928    Sherrell Puller, PA-C 05/03/22 1928    Lorelle Gibbs, DO 05/08/22 1644

## 2022-04-30 NOTE — ED Notes (Signed)
Patient transported to MRI 

## 2022-05-03 ENCOUNTER — Telehealth: Payer: Self-pay | Admitting: Orthopedic Surgery

## 2022-05-03 NOTE — Telephone Encounter (Signed)
Called patient scheduled

## 2022-05-03 NOTE — Telephone Encounter (Signed)
Patient's spouse Jason Avila, designated contact on file, ph#'s home [phone] / cell [phone], called to schedule appointment with Dr Amedeo Kinsman following [facility] visit on 04/30/22, for problem: bilateral sacral insufficiency fracture. Please review and advise.

## 2022-05-10 ENCOUNTER — Encounter (HOSPITAL_COMMUNITY)
Admission: RE | Admit: 2022-05-10 | Discharge: 2022-05-10 | Disposition: A | Discharge status: Home / Self Care | Payer: Medicare Other | Source: Ambulatory Visit | Attending: Hematology | Admitting: Hematology

## 2022-05-10 DIAGNOSIS — C210 Malignant neoplasm of anus, unspecified: Secondary | ICD-10-CM | POA: Diagnosis present

## 2022-05-10 DIAGNOSIS — Z923 Personal history of irradiation: Secondary | ICD-10-CM | POA: Diagnosis not present

## 2022-05-10 DIAGNOSIS — R188 Other ascites: Secondary | ICD-10-CM | POA: Insufficient documentation

## 2022-05-10 DIAGNOSIS — M8448XA Pathological fracture, other site, initial encounter for fracture: Secondary | ICD-10-CM | POA: Diagnosis not present

## 2022-05-10 DIAGNOSIS — Q6102 Congenital multiple renal cysts: Secondary | ICD-10-CM | POA: Insufficient documentation

## 2022-05-10 DIAGNOSIS — I700 Atherosclerosis of aorta: Secondary | ICD-10-CM | POA: Insufficient documentation

## 2022-05-10 DIAGNOSIS — Z9221 Personal history of antineoplastic chemotherapy: Secondary | ICD-10-CM | POA: Diagnosis not present

## 2022-05-10 IMAGING — CT NM PET TUM IMG RESTAG (PS) SKULL BASE T - THIGH
7 series · 25 of 25 positions shown · non-contrast
Comparison: MR pelvis dated [DATE]. CT abdomen/pelvis dated
[DATE]. PET-CT dated [DATE].

CLINICAL DATA: Subsequent treatment strategy for anal cancer,
status post chemotherapy and radiation.

EXAM:
NUCLEAR MEDICINE PET SKULL BASE TO THIGH
TECHNIQUE: 8.0 mCi F-18 FDG was injected intravenously. Full-ring PET imaging
was performed from the skull base to thigh after the radiotracer. CT
data was obtained and used for attenuation correction and anatomic
localization.
Fasting blood glucose: 147 mg/dl

[Series 3: ctac · axial · 3.0mm · 0.98mm/px · z∈[-902,-26]mm · 4 of 293 slices shown]
[im 1/293]
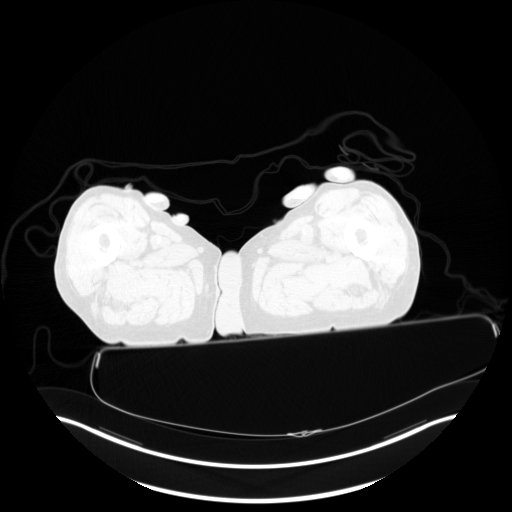
[im 98/293]
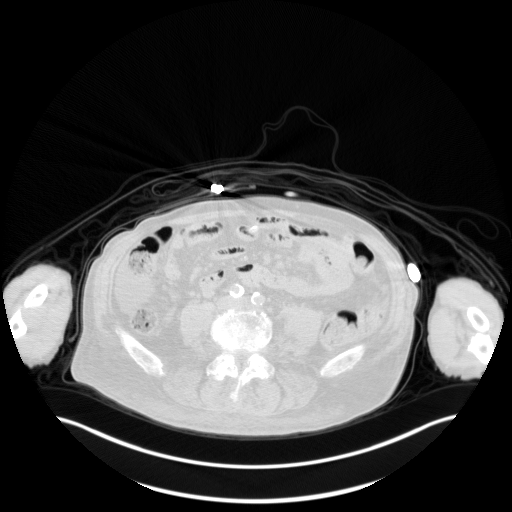
[im 195/293]
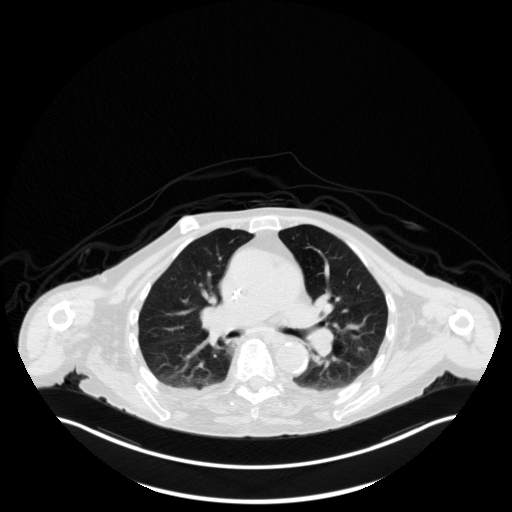
[im 293/293  brain]
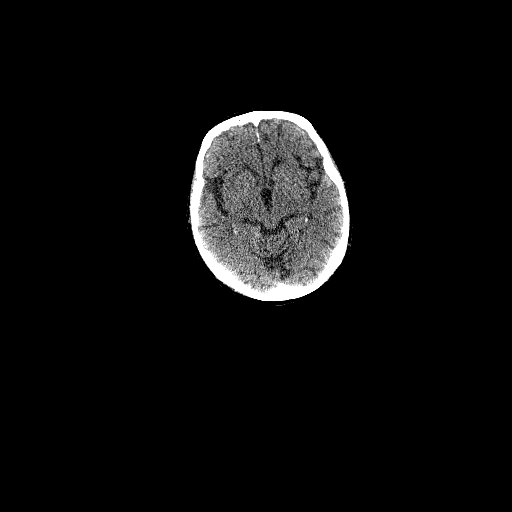

[Series 4: pet ac · axial · 3.0mm · 4.11mm/px · z∈[-902,-26]mm · 5 of 293 slices shown]
[im 1/293]
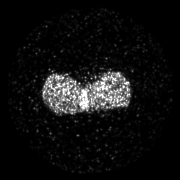
[im 74/293]
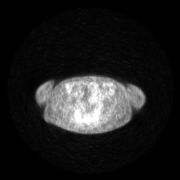
[im 147/293]
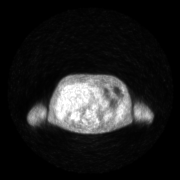
[im 220/293]
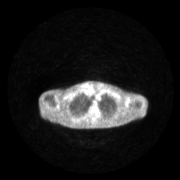
[im 293/293]
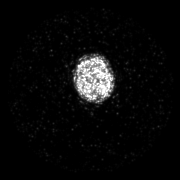

[Series 5: pet nac · axial · 3.0mm · 4.11mm/px · z∈[-902,-26]mm · 5 of 293 slices shown]
[im 1/293]
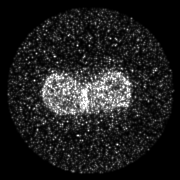
[im 74/293]
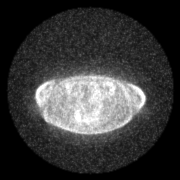
[im 147/293]
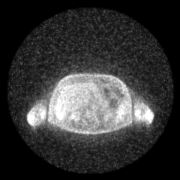
[im 220/293]
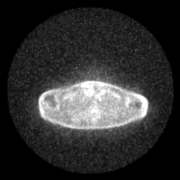
[im 293/293]
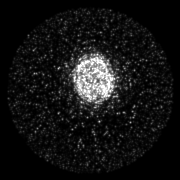

[Series 7: ct lung · axial · 3.0mm · 0.98mm/px · 1 of 93 slices shown]
[im 1/93]
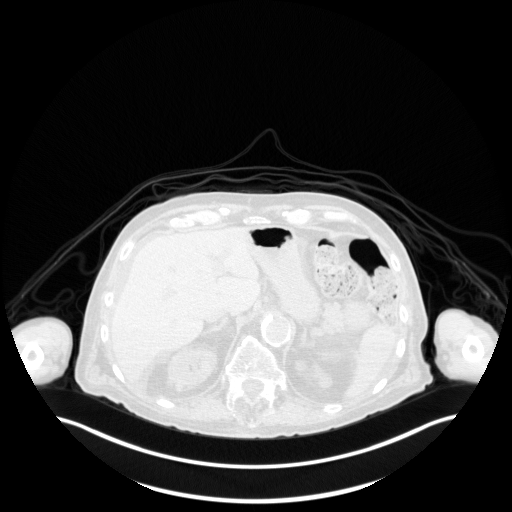

[Series 606: fused tra · 7 of 438 slices shown]
[im 1/438]
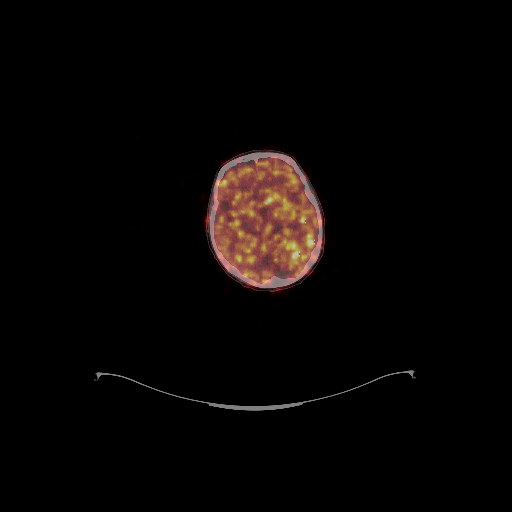
[im 73/438]
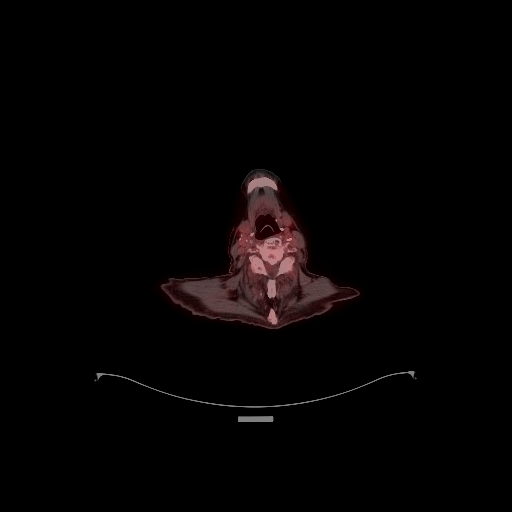
[im 146/438]
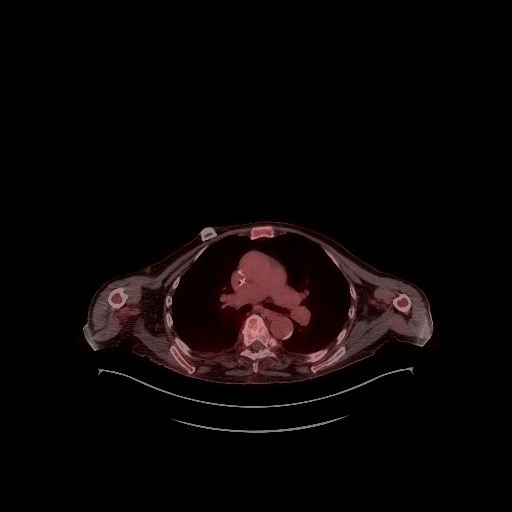
[im 219/438]
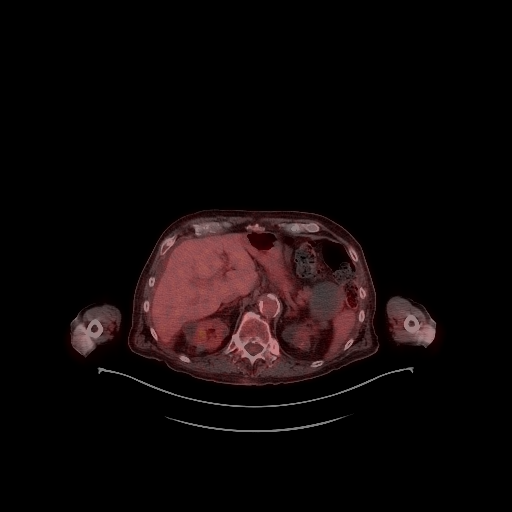
[im 292/438]
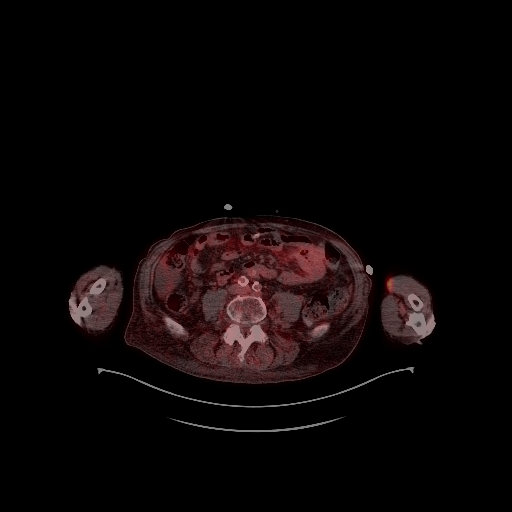
[im 365/438]
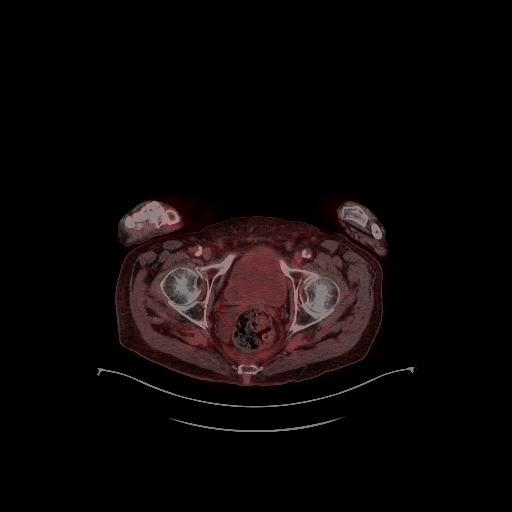
[im 438/438]
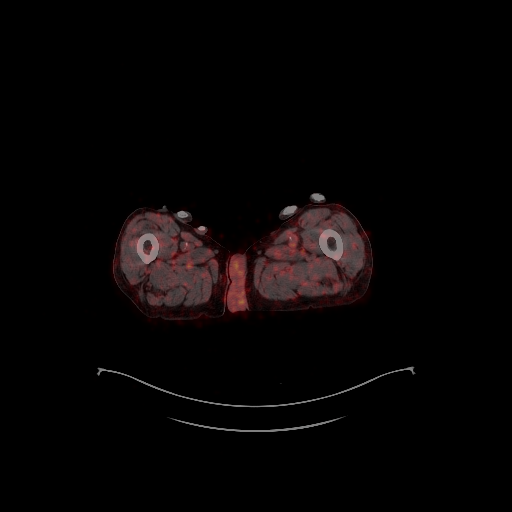

[Series 608: fused cor · 2 of 124 slices shown]
[im 1/124]
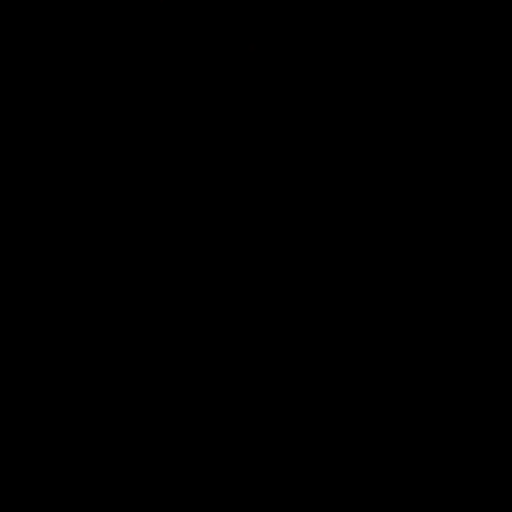
[im 124/124]
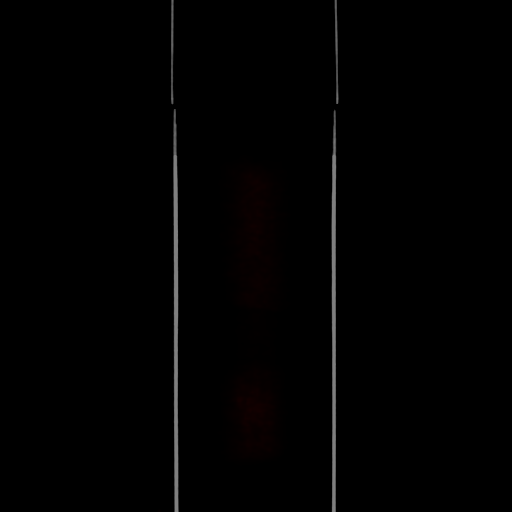

[Series 609: mip cine · coronal · 1.82mm/px · 1 of 48 slices shown]
[im 1/48]
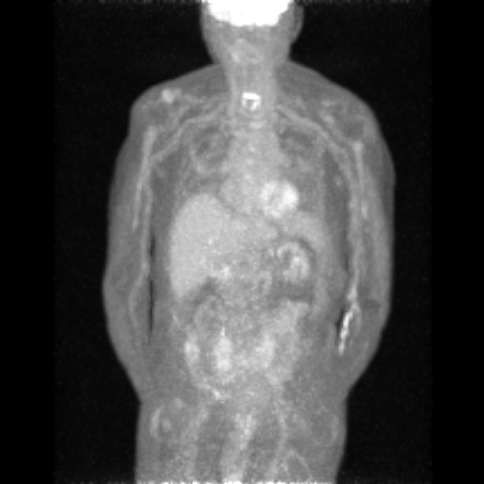

[25 of 25 positions shown; findings below may reference images not displayed]

FINDINGS: Mediastinal blood pool activity: SUV max

Liver activity: SUV max NA

NECK: No hypermetabolic cervical lymphadenopathy.

Incidental CT findings: none

CHEST: No hypermetabolic thoracic lymphadenopathy.

No suspicious pulmonary nodules.

Right chest port terminates at the cavoatrial junction.

Incidental CT findings: Atherosclerotic calcifications of the aortic
arch. Three vessel coronary atherosclerosis. Trace bilateral pleural
effusions.

ABDOMEN/PELVIS: No anorectal hypermetabolism to suggest residual
tumor.

No hypermetabolic abdominopelvic lymphadenopathy.

No abnormal metabolism in the liver, spleen, pancreas, or adrenal
glands.

Incidental CT findings: Moderate abdominopelvic ascites with left
pleural drain. Multiple bilateral renal cysts, some of which are
hemorrhagic. Atherosclerotic calcifications the abdominal aorta and
branch vessels.

SKELETON: Bilateral sacral hypermetabolism, max SUV 3.5,
corresponding to the patient's known sacral insufficiency fractures.

No focal hypermetabolic activity to suggest skeletal metastases.

Incidental CT findings: Degenerative changes of the visualized
thoracolumbar spine.
IMPRESSION: No evidence of residual, recurrent, or metastatic disease.

Bilateral sacral insufficiency fractures.

Additional ancillary findings as above.

## 2022-05-10 MED ORDER — FLUDEOXYGLUCOSE F - 18 (FDG) INJECTION
8.0400 | Freq: Once | INTRAVENOUS | Status: AC | PRN
  Administered 2022-05-10: 8.04 via INTRAVENOUS

## 2022-05-11 ENCOUNTER — Ambulatory Visit (INDEPENDENT_AMBULATORY_CARE_PROVIDER_SITE_OTHER): Payer: Medicare Other | Admitting: Orthopedic Surgery

## 2022-05-11 ENCOUNTER — Encounter: Payer: Self-pay | Admitting: Orthopedic Surgery

## 2022-05-11 ENCOUNTER — Ambulatory Visit (INDEPENDENT_AMBULATORY_CARE_PROVIDER_SITE_OTHER): Payer: Medicare Other

## 2022-05-11 VITALS — BP 217/113 | HR 98

## 2022-05-11 DIAGNOSIS — S3210XA Unspecified fracture of sacrum, initial encounter for closed fracture: Secondary | ICD-10-CM | POA: Diagnosis not present

## 2022-05-11 DIAGNOSIS — M25562 Pain in left knee: Secondary | ICD-10-CM

## 2022-05-11 DIAGNOSIS — S322XXA Fracture of coccyx, initial encounter for closed fracture: Secondary | ICD-10-CM

## 2022-05-11 NOTE — Patient Instructions (Signed)

## 2022-05-12 NOTE — Progress Notes (Signed)
New Patient Visit  Assessment: Jason Avila is a 88 y.o. male with the following: 1. Closed fracture of sacrum and coccyx, initial encounter ([facility]) 2. Left knee pain, unspecified chronicity  Plan: Jason Avila has bilateral insufficiency sacral ala fractures.  These are not visible on radiographs.  He states his pain in his back is getting better.  He is complaining of more left knee pain.  XR of the left knee are without acute injury.  Minimal degenerative changes.  Given all of his medical issues and recent radiation, there are many potential options, including radicular pain from his back.  After discussing this in great detail, he is willing to try a steroid injection.  This was completed in clinic today.  Immediately following the injection, he appeared to tolerate more ROM.  Follow up with cancer physicians next week and consider chronic pain management.   Procedure note injection Left knee joint   Verbal consent was obtained to inject the left knee joint  Timeout was completed to confirm the site of injection.  The skin was prepped with alcohol and ethyl chloride was sprayed at the injection site.  A 21-gauge needle was used to inject 40 mg of Depo-Medrol and 1% lidocaine (3 cc) into the left knee using an anterolateral approach.  There were no complications. A sterile bandage was applied.     Follow-up: Return if symptoms worsen or fail to improve.  Subjective:  Chief Complaint  Patient presents with   New Patient (Initial Visit)   Hip Pain    Sacrum/ESRD on dialysis C/o leg pain not back pain    History of Present Illness: Jason Avila is a 88 y.o. male who presents for evaluation of lower back pain.  He has a complicated PMH including anorectal cancer having undergone radiation treatment recently.  He fell almost 2 months ago and eventually presented to the ED for evaluation.  Advanced imaging demonstrated bilateral insufficiency sacral ala fractures.  He has been ambulating  with assistance and using a walker at all times.  He is deconditioned from his other medical issues and requires assistance from family.  He has a lot of pain in his left knee, more than his back.  He has sharp, stabbing pains in his left knee.  This worsens when he bears weight through his knee. No prior injury.  No numbness or tingling in his feet.    Review of Systems: No fevers or chills No numbness or tingling No chest pain No shortness of breath No bowel or bladder dysfunction No GI distress No headaches   Medical History:  Past Medical History:  Diagnosis Date   Anemia    Aortic valve stenosis    mild AS 04/2017 (followed by Dr. Cleora Fleet, [place])   Bilateral hearing loss    bilateral hearing aids   Cancer [facility]) 2018   basal cell carcinoma on nose   Chronic kidney disease    ckd stage 4, bilateral cysts on kidneys [facility])   Dysrhythmia    RBBB   GERD (gastroesophageal reflux disease)    Gout    left foot   Heart murmur    no problems   History of blood transfusion 2018   History of radiation therapy 11/18/19- 12/16/19   Larynx 54 Gy in 20 fx   Hyperlipidemia    Hypertension    Port-A-Cath in place 01/05/2022   Subdural hematoma ([facility]) 11/2018   Wears glasses     Past Surgical History:  Procedure Laterality Date   BURR HOLE FOR SUBDURAL HEMATOMA  12/15/2018   brain   COLONOSCOPY     EYE SURGERY Bilateral    cataracts removed   MICROLARYNGOSCOPY N/A 10/23/2019   Procedure: Microlaryngoscopy with vocal cord lesion biopsy;  Surgeon: Jerrell Belfast, MD;  Location: [facility];  Service: ENT;  Laterality: N/A;   PERITONEAL CATHETER INSERTION     PORTACATH PLACEMENT Right 01/15/2022   Procedure: INSERTION PORT-A-CATH RIJ;  Surgeon: Rusty Aus, DO;  Location: [facility];  Service: General;  Laterality: Right;  pt can't come earlier d/t peritoneal dialysis   UPPER GI ENDOSCOPY      History reviewed. No pertinent family  history. Social History   Tobacco Use   Smoking status: Former    Packs/day: 0.25    Years: 40.00    Pack years: 10.00    Types: Cigarettes    Quit date: 12/22/1996    Years since quitting: 25.4   Smokeless tobacco: Never   Tobacco comments:    Quit in 1998  Vaping Use   Vaping Use: Never used  Substance Use Topics   Alcohol use: Never   Drug use: Never    No Known Allergies  Current Meds  Medication Sig   atorvastatin (LIPITOR) 20 MG tablet Take 20 mg by mouth daily at 6 PM.    B12-ACTIVE 1 MG CHEW Chew 1 tablet by mouth daily.   calcium acetate (PHOSLO) 667 MG tablet Take 667 mg by mouth 3 (three) times daily.   Cholecalciferol 25 MCG (1000 UT) capsule Take 1,000 Units by mouth daily.   dicyclomine (BENTYL) 10 MG capsule Take 1 capsule (10 mg total) by mouth 4 (four) times daily -  before meals and at bedtime.   docusate sodium (COLACE) 100 MG capsule Take 100 mg by mouth 2 (two) times daily.   epoetin alfa (EPOGEN) 10000 UNIT/ML injection Inject 1 Units into the skin every 14 (fourteen) days.   hydrALAZINE (APRESOLINE) 100 MG tablet Take 100 mg by mouth once.   HYDROcodone-acetaminophen (NORCO/VICODIN) 5-325 MG tablet Take 1 tablet by mouth every 4 (four) hours as needed.   levothyroxine (SYNTHROID) 25 MCG tablet Take 50 mcg by mouth every morning.   lidocaine-prilocaine (EMLA) cream Apply a small amount to port a cath site and cover with plastic wrap 1 hour prior to infusion appointments   magnesium oxide (MAG-OX) 400 (240 Mg) MG tablet Take 1 tablet (400 mg total) by mouth 2 (two) times daily.   Methoxy PEG-Epoetin Beta (MIRCERA IJ) Inject into the skin.   mirtazapine (REMERON) 15 MG tablet Take 15 mg by mouth daily.   ondansetron (ZOFRAN) 4 MG tablet Take 1 tablet (4 mg total) by mouth every 6 (six) hours.   pantoprazole (PROTONIX) 40 MG tablet Take 40 mg by mouth every morning.   prochlorperazine (COMPAZINE) 10 MG tablet Take 1 tablet (10 mg total) by mouth every 6 (six)  hours as needed (Nausea or vomiting).   sodium bicarbonate 650 MG tablet SMARTSIG:1 Tablet(s) By Mouth   sucroferric oxyhydroxide (VELPHORO) 500 MG chewable tablet Chew 500 tablets by mouth daily.    Objective: BP (!) 217/113   Pulse 98   Physical Exam:  General: Elderly male., Alert and oriented., and Seated in a wheelchair. Gait: Unable to ambulate.  Sensation intact distally.  No swelling in the left knee.  Minimal tenderness along the medial or lateral joint lines.  He is able to maintain a straight leg raise.   No  increased laxity to varus or valgus stress. Negative Lachman.   IMAGING: I personally ordered and reviewed the following images  XR of the sacrum is without obvious injury.  No displacement is appreciated.  Bone quality is poor.  Overall alignment remains in acceptable position.   Impression: Sacrum and coccyx without obvious displaced fracture  XR of the left knee is without acute injury. Minimal loss of join space, but most prominent within the medial compartment.  No effusion is appreciated.   Impression: left knee XR with mild loss of joint space medially.    New Medications:  No orders of the defined types were placed in this encounter.     Mordecai Rasmussen, MD  05/12/2022 9:38 PM

## 2022-05-15 NOTE — Progress Notes (Signed)
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, p16 positive  PRIOR THERAPY:  Mitomycin D1,28 / 5FU D1-4, 28-31 q32d XRT with 5-FU (01/29/2022 - 03/07/2022)  NGS Results: not done  CURRENT THERAPY: surveillance   BRIEF ONCOLOGIC HISTORY:  Oncology History  Carcinoma of glottis (Jason Avila)  11/09/2019 Cancer Staging   Staging form: Jason Avila - Glottis, AJCC 8th Edition - Clinical stage from 11/09/2019: Stage 0 (cTis, cN0, cM0) - Signed by Jason Gibson, MD on 11/10/2019    11/10/2019 Initial Diagnosis   Carcinoma of glottis (Jason Avila)    Anal cancer (Jason Avila)  12/19/2021 Initial Diagnosis   Anal cancer ([place])    01/29/2022 -  Chemotherapy   Patient is on Treatment Plan : ANUS 5FU weekly pump         CANCER STAGING: Cancer Staging  Anal cancer (Jason Avila) Staging form: Anus, AJCC V9 - Clinical stage from 12/20/2021: Stage IIA (cT2, cN0, cM0) - Unsigned  Carcinoma of glottis ([place]) Staging form: Jason Avila - Glottis, AJCC 8th Edition - Clinical stage from 11/09/2019: Stage 0 (cTis, cN0, cM0) - Signed by Jason Gibson, MD on 11/10/2019   INTERVAL HISTORY:  [name], a 88 y.o. male, returns for routine follow-up of his squamous cell carcinoma of the anal canal. Jason Avila was last seen on 04/17/2022.   He is lying down on the table today.  He reports pain in his knees, left more than right when he tries to stand up or even sit down.  He was evaluated by Dr. Regino Avila from orthopedics.  He had steroid injections but without much help.  He denies any buttock pain or thigh pains.  Denies any GI symptoms.  He is undergoing peritoneal dialysis overnight.  REVIEW OF SYSTEMS:  Review of Systems  Musculoskeletal:  Positive for arthralgias.  All other systems reviewed and are negative.  PAST  MEDICAL/SURGICAL HISTORY:  Past Medical History:  Diagnosis Date   Anemia    Aortic valve stenosis    mild AS 04/2017 (followed by Dr. 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   Chronic kidney disease    ckd stage 4, bilateral cysts on kidneys [facility])   Dysrhythmia    RBBB   GERD (gastroesophageal reflux disease)    Gout    left foot   Heart murmur    no problems   History of blood transfusion 2018   History of radiation therapy 11/18/19- 12/16/19   Jason Avila 54 Gy in 20 fx   Hyperlipidemia    Hypertension    Port-A-Cath in place 01/05/2022   Subdural hematoma ([place]) 11/2018   Wears glasses    Past Surgical History:  Procedure Laterality Date   BURR HOLE FOR SUBDURAL HEMATOMA  12/15/2018   brain   COLONOSCOPY     EYE SURGERY Bilateral    cataracts removed   MICROLARYNGOSCOPY N/A 10/23/2019   Procedure: Microlaryngoscopy with vocal cord lesion biopsy;  Surgeon: Jason Belfast, MD;  Location: [facility];  Service: ENT;  Laterality: N/A;   [place] Right 01/15/2022   Procedure: INSERTION PORT-A-CATH RIJ;  Surgeon: Jason Aus, DO;  Location: [facility];  Service: General;  Laterality: Right;  pt can't come earlier d/t peritoneal dialysis   UPPER GI ENDOSCOPY      SOCIAL HISTORY:  Social History   Socioeconomic History   Marital status: Married    Spouse name: Not on file   Number of children: Not on file   Years of education: Not on file   Highest education level: Not on file  Occupational History   Not on file  Tobacco Use   Smoking status: Former    Packs/day: 0.25    Years: 40.00    Pack years: 10.00    Types: Cigarettes    Quit date: 12/22/1996    Years since quitting: 25.4   Smokeless tobacco: Never   Tobacco comments:    Quit in 1998  Vaping Use   Vaping Use: Never used  Substance and Sexual Activity    Alcohol use: Never   Drug use: Never   Sexual activity: Yes  Other Topics Concern   Not on file  Social History Narrative   Not on file   Social Determinants of Health   Financial Resource Strain: Not on file  Food Insecurity: Not on file  Transportation Needs: Not on file  Physical Activity: Not on file  Stress: Not on file  Social Connections: Not on file  Intimate Partner Violence: Not on file    FAMILY HISTORY:  No family history on file.  CURRENT MEDICATIONS:  Current Outpatient Medications  Medication Sig Dispense Refill   atorvastatin (LIPITOR) 20 MG tablet Take 20 mg by mouth daily at 6 PM.      B12-ACTIVE 1 MG CHEW Chew 1 tablet by mouth daily.     calcium acetate (PHOSLO) 667 MG tablet Take 667 mg by mouth 3 (three) times daily.     Cholecalciferol 25 MCG (1000 UT) capsule Take 1,000 Units by mouth daily.     dicyclomine (BENTYL) 10 MG capsule Take 1 capsule (10 mg total) by mouth 4 (four) times daily -  before meals and at bedtime. 120 capsule 1   docusate sodium (COLACE) 100 MG capsule Take 100 mg by mouth 2 (two) times daily.     epoetin alfa (EPOGEN) 10000 UNIT/ML injection Inject 1 Units into the skin every 14 (fourteen) days.     hydrALAZINE (APRESOLINE) 100 MG tablet Take 100 mg by mouth once.     HYDROcodone-acetaminophen (NORCO/VICODIN) 5-325 MG tablet Take 1 tablet by mouth every 4 (four) hours as needed. 10 tablet 0   levothyroxine (SYNTHROID) 25 MCG tablet Take 50 mcg by mouth every morning.     lidocaine-prilocaine (EMLA) cream Apply a small amount to port a cath site and cover with plastic wrap 1 hour prior to infusion appointments 30 g 3   magnesium oxide (MAG-OX) 400 (240 Mg) MG tablet Take 1 tablet (400 mg total) by mouth 2 (two) times daily. 60 tablet 3   Methoxy PEG-Epoetin Beta (MIRCERA IJ) Inject into the skin.     mirtazapine (REMERON) 15 MG tablet Take 15 mg by mouth daily.     ondansetron (ZOFRAN) 4 MG tablet Take 1 tablet (4 mg total) by  mouth every 6 (six) hours. 12 tablet 0   pantoprazole (PROTONIX) 40 MG tablet Take 40 mg by mouth every morning.     prochlorperazine (COMPAZINE) 10 MG tablet Take 1 tablet (10 mg total) by mouth every 6 (six) hours as needed (Nausea or vomiting). 30 tablet 1   sodium bicarbonate 650 MG tablet SMARTSIG:1 Tablet(s) By  Mouth     sucroferric oxyhydroxide (VELPHORO) 500 MG chewable tablet Chew 500 tablets by mouth daily.     No current facility-administered medications for this visit.    ALLERGIES:  No Known Allergies  PHYSICAL EXAM:  Performance status (ECOG): 1 - Symptomatic but completely ambulatory  There were no vitals filed for this visit. Wt Readings from Last 3 Encounters:  04/30/22 160 lb (72.6 kg)  04/20/22 159 lb (72.1 kg)  04/17/22 164 lb (74.4 kg)   Physical Exam Vitals reviewed.  Constitutional:      Appearance: Normal appearance.  Neurological:     Mental Status: He is alert.  Psychiatric:        Mood and Affect: Mood normal.        Behavior: Behavior normal.     LABORATORY DATA:  I have reviewed the labs as listed.     Latest Ref Rng & Units 04/20/2022    3:43 PM 04/17/2022   10:26 AM 03/05/2022    9:43 AM  CBC  WBC 4.0 - 10.5 K/uL 3.2   4.3   3.8    Hemoglobin 13.0 - 17.0 g/dL 9.2   9.2   8.5    Hematocrit 39.0 - 52.0 % 29.9   29.5   27.1    Platelets 150 - 400 K/uL 193   195   181        Latest Ref Rng & Units 04/20/2022    3:43 PM 04/17/2022   10:26 AM 03/05/2022    9:43 AM  CMP  Glucose 70 - 99 mg/dL 126   127   132    BUN 8 - 23 mg/dL 60   56   72    Creatinine 0.61 - 1.24 mg/dL 6.49   6.39   6.61    Sodium 135 - 145 mmol/L 141   140   138    Potassium 3.5 - 5.1 mmol/L 3.9   4.2   4.1    Chloride 98 - 111 mmol/L 104   103   99    CO2 22 - 32 mmol/L '28   29   29    '$ Calcium 8.9 - 10.3 mg/dL 8.1   8.1   7.6    Total Protein 6.5 - 8.1 g/dL 5.9   5.6   5.2    Total Bilirubin 0.3 - 1.2 mg/dL 0.4   0.4   0.5    Alkaline Phos 38 - 126 U/L 80   75   45     AST 15 - 41 U/L '18   18   19    '$ ALT 0 - 44 U/L '12   13   10      '$ DIAGNOSTIC IMAGING:  I have independently reviewed the scans and discussed with the patient. CT ABDOMEN PELVIS WO CONTRAST  Result Date: 04/20/2022 CLINICAL DATA:  Abdominal pain EXAM: CT ABDOMEN AND PELVIS WITHOUT CONTRAST TECHNIQUE: Multidetector CT imaging of the abdomen and pelvis was performed following the standard protocol without IV contrast. RADIATION DOSE REDUCTION: This exam was performed according to the departmental dose-optimization program which includes automated exposure control, adjustment of the mA and/or kV according to patient size and/or use of iterative reconstruction technique. COMPARISON:  PET-CT dated December 21, 2021 FINDINGS: Lower chest: No acute abnormality. Hepatobiliary: No focal liver abnormality is seen. No gallstones, gallbladder wall thickening, or biliary dilatation. Pancreas: Cystic lesion of the uncinate process measuring 4.0 cm on series 5, image 39. No pancreatic ductal  dilatation or surrounding inflammatory changes. Spleen: Normal in size without focal abnormality. Adrenals/Urinary Tract: Bilateral adrenal glands are unremarkable. Numerous bilateral renal cysts varying complexity. No hydronephrosis. Stomach/Bowel: Stomach is within normal limits. Appendix appears normal. No evidence of bowel wall thickening, distention, or inflammatory changes. Vascular/Lymphatic: Aortic atherosclerosis. No enlarged abdominal or pelvic lymph nodes. Reproductive: Prostatomegaly. Other: No abdominal wall hernia or abnormality. Mild presacral fat stranding, interval resolution of small volume pelvic free fluid. Peritoneal dialysis catheter with distal aspect in the pelvis. Musculoskeletal: Stable sclerotic lesion of L2, not hypermetabolic on prior exam. No acute osseous findings. IMPRESSION: 1. No acute findings in the abdomen or pelvis. 2. Bilateral cystic renal lesions of varying complexity, unchanged when compared prior  exam, dedicated abdominal MRI with and without contrast could be performed for further characterization if clinically warranted. 3. Cystic lesion of the uncinate process of the pancreas measuring 4.0 cm, likely a intraductal papillary mucinous neoplasm. Recommend attention on follow-up surveillance imaging. 4. Aortic Atherosclerosis (ICD10-I70.0). Electronically Signed   By: Yetta Glassman M.D.   On: 04/20/2022 18:14   DG Sacrum/Coccyx  Result Date: 05/14/2022 XR of the sacrum is without obvious injury.  No displacement is appreciated.  Bone quality is poor.  Overall alignment remains in acceptable position.   Impression: Sacrum and coccyx without obvious displaced fracture  DG Sacrum/Coccyx  Result Date: 04/30/2022 CLINICAL DATA:  Sacral ala fractures EXAM: SACRUM AND COCCYX - 2+ VIEW COMPARISON:  Same-day MRI of the pelvis FINDINGS: A fracture of the left sacral ala is seen, as seen on same day pelvis MRI. The right sacral ala fracture is not well appreciated on the current study. No other fracture is seen. The SI joints are intact. Femoroacetabular alignment appears maintained. The symphysis pubis is intact. IMPRESSION: Left sacral ala fracture as seen on the same-day MRI of the pelvis. The right sacral ala fracture is not well seen. Electronically Signed   By: Valetta Mole M.D.   On: 04/30/2022 17:06   DG Knee 1-2 Views Left  Result Date: 05/14/2022 XR of the left knee is without acute injury. Minimal loss of join space, but most prominent within the medial compartment.  No effusion is appreciated.   Impression: left knee XR with mild loss of joint space medially.    MR LUMBAR SPINE WO CONTRAST  Result Date: 04/30/2022 CLINICAL DATA:  Back pain after fall.  History of anal cancer EXAM: MRI LUMBAR SPINE WITHOUT CONTRAST TECHNIQUE: Multiplanar, multisequence MR imaging of the lumbar spine was performed. No intravenous contrast was administered. COMPARISON:  CT 04/20/2022.  PET-CT 12/21/2021  FINDINGS: Segmentation:  Standard. Alignment:  Physiologic. Vertebrae: Partially visualized bilateral sacral alar fractures with associated bone marrow edema, which will be further characterized on concurrently obtained MRI of the pelvis. Lumbar vertebral body heights are maintained. Mild focal marrow edema at the superior endplate of L5 favors discogenic endplate marrow changes and possible developing Schmorl's node. Subtle fracture at this location not entirely excluded. Otherwise, no evidence of acute fracture of the lumbar spine. Previously described subacute fracture of the left L1 transverse process is not included within the field of view. No evidence of discitis. Intraosseous hemangioma within the S1 segment. Additional lesion within the posterior aspect of the L2 vertebral body on the right likely represents a atypical hemangioma. No metabolic activity was seen within this location on previous PET-CT. No definite marrow replacing bone lesion is identified. Conus medullaris and cauda equina: Conus extends to the T12-L1 level. Conus and cauda equina  appear normal. Paraspinal and other soft tissues: Cortically based T2 hyperintense lesionswithin the bilateral kidneys, incompletely characterized, but most likely represent cysts. Disc levels: T12-L1: Sagittal sequences only.  Unremarkable. L1-L2: Minimal disc bulge.  No foraminal or canal stenosis. L2-L3: Minimal annular disc bulge. Mild bilateral facet hypertrophy. No foraminal or canal stenosis. L3-L4: Minimal annular disc bulge. Mild bilateral facet hypertrophy. No foraminal or canal stenosis. L4-L5: Disc height loss with mild annular disc bulge and endplate ridging. Mild bilateral facet arthropathy with ligamentum flavum buckling. Mild canal stenosis with mild bilateral subarticular recess stenosis and mild bilateral foraminal stenosis. L5-S1: No disc protrusion. Mild bilateral facet arthropathy. No foraminal or canal stenosis. IMPRESSION: 1. Partially  visualized bilateral sacral alar fractures with associated bone marrow edema, which will be further characterized on concurrently obtained MRI of the pelvis. 2. Mild focal marrow edema at the superior endplate of L5 favors discogenic endplate marrow changes. Subtle fracture at this location not entirely excluded. Otherwise, no evidence of acute fracture of the lumbar spine. Previously described subacute fracture of the left L1 transverse process is not included within the field of view. 3. Mild lumbar spondylosis most notable at the L4-5 level where there is mild canal stenosis, mild bilateral subarticular recess stenosis, and mild bilateral foraminal stenosis. 4. No definite evidence of osseous metastatic disease. Previously seen bone lesion in the L2 vertebral body favors a atypical hemangioma. This was noted to be not hypermetabolic on previous CT. Electronically Signed   By: Davina Poke D.O.   On: 04/30/2022 15:25   MR PELVIS WO CONTRAST  Result Date: 04/30/2022 CLINICAL DATA:  Posterior pelvic pain after a fall. History of metastatic rectal cancer. EXAM: MRI PELVIS WITHOUT CONTRAST TECHNIQUE: Multiplanar multisequence MR imaging of the pelvis was performed. No intravenous contrast was administered. COMPARISON:  CT abdomen pelvis dated Apr 20, 2022. FINDINGS: Bones: Subacute bilateral sacral ala insufficiency fractures with associated marrow edema. 2.2 cm S1 hemangioma. No other focal bone lesion. The visualized sacroiliac joints and symphysis pubis appear normal. Articular cartilage and labrum Articular cartilage: Overall mild partial-thickness cartilage loss in both hip joints with small marginal osteophytes. Labrum: Both labrum are degenerated and frayed. Superimposed right posterior labral tear with small paralabral cyst (series 3, image 34). Joint or bursal effusion Joint effusion: No significant hip joint effusion. Bursae: No focal periarticular fluid collection. Muscles and tendons Muscles and  tendons: The visualized gluteus, hamstring and iliopsoas tendons appear normal. Other findings Miscellaneous: Small pelvic ascites. Unchanged irregular wall thickening of the rectum. Mild presacral edema. IMPRESSION: 1. Subacute bilateral sacral ala insufficiency fractures. 2. Mild bilateral hip osteoarthritis. 3. Unchanged irregular wall thickening of the rectum, consistent with patient's known rectal cancer. Electronically Signed   By: Titus Dubin M.D.   On: 04/30/2022 15:23   NM PET Image Restag (PS) Skull Base To Thigh  Result Date: 05/13/2022 CLINICAL DATA:  Subsequent treatment strategy for anal cancer, status post chemotherapy and radiation. EXAM: NUCLEAR MEDICINE PET SKULL BASE TO THIGH TECHNIQUE: 8.0 mCi F-18 FDG was injected intravenously. Full-ring PET imaging was performed from the skull base to thigh after the radiotracer. CT data was obtained and used for attenuation correction and anatomic localization. Fasting blood glucose: 147 mg/dl COMPARISON:  MR pelvis dated 04/30/2022. CT abdomen/pelvis dated 04/20/2022. PET-CT dated 12/21/2021. FINDINGS: Mediastinal blood pool activity: SUV max 2.4 Liver activity: SUV max NA NECK: No hypermetabolic cervical lymphadenopathy. Incidental CT findings: none CHEST: No hypermetabolic thoracic lymphadenopathy. No suspicious pulmonary nodules. Right chest port terminates  at the cavoatrial junction. Incidental CT findings: Atherosclerotic calcifications of the aortic arch. Three vessel coronary atherosclerosis. Trace bilateral pleural effusions. ABDOMEN/PELVIS: No anorectal hypermetabolism to suggest residual tumor. No hypermetabolic abdominopelvic lymphadenopathy. No abnormal metabolism in the liver, spleen, pancreas, or adrenal glands. Incidental CT findings: Moderate abdominopelvic ascites with left pleural drain. Multiple bilateral renal cysts, some of which are hemorrhagic. Atherosclerotic calcifications the abdominal aorta and branch vessels. SKELETON:  Bilateral sacral hypermetabolism, max SUV 3.5, corresponding to the patient's known sacral insufficiency fractures. No focal hypermetabolic activity to suggest skeletal metastases. Incidental CT findings: Degenerative changes of the visualized thoracolumbar spine. IMPRESSION: No evidence of residual, recurrent, or metastatic disease. Bilateral sacral insufficiency fractures. Additional ancillary findings as above. Electronically Signed   By: Julian Hy M.D.   On: 05/13/2022 19:06   CT L-SPINE NO CHARGE  Result Date: 04/20/2022 CLINICAL DATA:  Low back pain with bilateral radiculopathy. History of anal cancer EXAM: CT LUMBAR SPINE WITHOUT CONTRAST TECHNIQUE: Multidetector CT imaging of the lumbar spine was performed without intravenous contrast administration. Multiplanar CT image reconstructions were also generated. RADIATION DOSE REDUCTION: This exam was performed according to the departmental dose-optimization program which includes automated exposure control, adjustment of the mA and/or kV according to patient size and/or use of iterative reconstruction technique. COMPARISON:  PET-CT 12/21/2021 FINDINGS: Segmentation: 5 lumbar type vertebrae. Alignment: Normal. Vertebrae: Subacute fractures involving the posterior left eleventh and twelfth ribs at the costovertebral junctions (series 3, images 6 and 29). Additional subacute fracture involving the tip of the left L1 transverse process (series 3, image 47). These fractures are new from 12/21/2021 but demonstrate partial callus formation. No evidence of acute fracture. 2.2 cm sclerotic lesion within the L2 vertebral body on the right, stable in appearance from prior PET-CT and was not hypermetabolic on that exam. No additional bone lesions are seen. Bones appear demineralized. Paraspinal and other soft tissues: See dedicated CT abdomen pelvis report for evaluation of the intra-abdominal structures. Disc levels: Lumbar intervertebral disc heights are  preserved. Mild-moderate facet arthropathy throughout the lumbar spine without evidence of high-grade foraminal or canal stenosis by CT. IMPRESSION: 1. Subacute fractures involving the posterior left eleventh and twelfth ribs at the costovertebral junctions. 2. Additional subacute fracture involving the tip of the left L1 transverse process. 3. No evidence of acute fracture or traumatic malalignment of the lumbar spine. 4. Mild-moderate facet arthropathy throughout the lumbar spine. Electronically Signed   By: Davina Poke D.O.   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 - EGD and colonoscopy on 12/08/2021 with grossly normal EGD.  Colonoscopy showed 2 cm mass with cratered ulcer with distal margin abutting the anal verge. 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 - Reports 30 pound weight loss unintentional in the last 6 months.  He was also started on hemodialysis in September 2022. - MRI rectal cancer protocol (at [facility] clinic) shows T1/T2.  No lymph nodes were seen.  Craniocaudal length of the tumor is 3.3 cm. - Rectal exam showed mass palpable in the anal canal about 2 cm from the anal verge, predominantly on the right side. - XRT with 5-FU started on 01/29/2022.  Finished on 03/07/2022.    Social/family history: - Lives at home with his wife.  He is a retired Glass blower/designer at [employer].  Quit smoking 24  years ago.  No chemical exposure.  No family history of malignancies.  3.  ESRD: - He is on peritoneal dialysis at home, started on 08/23/2021. - He is followed by Dr. Elita Quick in [place].  4.  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   PLAN:  T2 squamous cell carcinoma of the anal canal, p16  positive: -We have reviewed PET CT scan from 05/10/2022 which did not show any evidence of recurrence or metastatic disease. - He reports pain in his knees, left more than right in the last few weeks, preventing him from standing up or sitting down position.  He was evaluated by Dr. Regino Avila from orthopedics and x-rays were taken.  No clear abnormality was found.  He received steroid injections which did not help much. - I do not know if it is radiation-induced.  However we will start him on gabapentin at low-dose of 100 mg after each dialysis once daily.  If there is no drowsiness, he may increase it to 2 tablets/day for better pain control. - We will also make a neurology consultation. - We talked about the need for anoscopy.  We will make a GI referral. - RTC 6 months for follow-up with repeat labs and PET scan.  2.  B12 deficiency: -Continue B12 tablet daily.  3.  Hypomagnesemia: -Continue magnesium daily.  4.  Normocytic anemia: -Continue ESA and parenteral iron therapy at nephrology office.   Orders placed this encounter:  No orders of the defined types were placed in this encounter.    Derek Jack, MD [place] [phone]   I, Thana Ates, am acting as a scribe for Dr. Derek Jack.  I, Derek Jack MD, have reviewed the above documentation for accuracy and completeness, and I agree with the above.

## 2022-05-17 ENCOUNTER — Inpatient Hospital Stay (HOSPITAL_COMMUNITY): Payer: Medicare Other | Attending: Hematology | Admitting: Hematology

## 2022-05-17 ENCOUNTER — Encounter (HOSPITAL_COMMUNITY): Payer: Self-pay

## 2022-05-17 VITALS — BP 158/87 | HR 88 | Temp 98.5°F | Resp 18

## 2022-05-17 DIAGNOSIS — C210 Malignant neoplasm of anus, unspecified: Secondary | ICD-10-CM

## 2022-05-17 DIAGNOSIS — Z79899 Other long term (current) drug therapy: Secondary | ICD-10-CM | POA: Diagnosis not present

## 2022-05-17 DIAGNOSIS — Z85048 Personal history of other malignant neoplasm of rectum, rectosigmoid junction, and anus: Secondary | ICD-10-CM | POA: Diagnosis not present

## 2022-05-17 DIAGNOSIS — E538 Deficiency of other specified B group vitamins: Secondary | ICD-10-CM | POA: Diagnosis not present

## 2022-05-17 DIAGNOSIS — Z8521 Personal history of malignant neoplasm of larynx: Secondary | ICD-10-CM | POA: Insufficient documentation

## 2022-05-17 DIAGNOSIS — D649 Anemia, unspecified: Secondary | ICD-10-CM | POA: Insufficient documentation

## 2022-05-17 MED ORDER — MISC. DEVICES MISC: 0 refills | Status: DC

## 2022-05-17 MED ORDER — MISC. DEVICES MISC
0 refills | Status: AC
Start: 2022-05-17 — End: ?

## 2022-05-17 MED ORDER — GABAPENTIN 100 MG PO CAPS: 100.0000 mg | ORAL_CAPSULE | Freq: Four times a day (QID) | ORAL | 5 refills | Status: AC

## 2022-05-17 NOTE — Patient Instructions (Signed)
[place] at [facility] Discharge Instructions  You were seen and examined today by Dr. Delton Coombes.  Dr. Delton Coombes discussed your most recent lab work and CT scan which revealed that there is no signs of cancer. Neurology and Gastrology referrals sent. Dr. Delton Coombes sent in Gabapentin to be taken once daily to see if it helps with your knee pain.   Follow-up as scheduled in 6 months.    Thank you for choosing [place] at [facility] to provide your oncology and hematology care.  To afford each patient quality time with our provider, please arrive at least 15 minutes before your scheduled appointment time.   If you have a lab appointment with the [facility] please come in thru the Main Entrance and check in at the main information desk.  You need to re-schedule your appointment should you arrive 10 or more minutes late.  We strive to give you quality time with our providers, and arriving late affects you and other patients whose appointments are after yours.  Also, if you no show three or more times for appointments you may be dismissed from the clinic at the providers discretion.     Again, thank you for choosing [facility].  Our hope is that these requests will decrease the amount of time that you wait before being seen by our physicians.       _____________________________________________________________  Should you have questions after your visit to [facility], please contact our office at [phone] and follow the prompts.  Our office hours are 8:00 a.m. and 4:30 p.m. Monday - Friday.  Please note that voicemails left after 4:00 p.m. may not be returned until the following business day.  We are closed weekends and major holidays.  You do have access to a nurse 24-7, just call the main number to the clinic [phone] and do not press any options, hold on the line and a nurse will answer the phone.    For  prescription refill requests, have your pharmacy contact our office and allow 72 hours.    Due to Covid, you will need to wear a mask upon entering the hospital. If you do not have a mask, a mask will be given to you at the Main Entrance upon arrival. For doctor visits, patients may have 1 support person age 86 or older with them. For treatment visits, patients can not have anyone with them due to social distancing guidelines and our immunocompromised population.

## 2022-05-23 ENCOUNTER — Encounter (HOSPITAL_COMMUNITY): Payer: Self-pay | Admitting: *Deleted

## 2022-05-23 NOTE — Progress Notes (Signed)
Medical necessity form for seat lift mechanism faxed to [facility] to [phone].  Copy canned to Epic.

## 2022-05-26 ENCOUNTER — Other Ambulatory Visit: Payer: Self-pay | Admitting: Nurse Practitioner

## 2022-06-07 ENCOUNTER — Ambulatory Visit (INDEPENDENT_AMBULATORY_CARE_PROVIDER_SITE_OTHER): Payer: Medicare Other | Admitting: Neurology

## 2022-06-07 ENCOUNTER — Encounter: Payer: Self-pay | Admitting: Neurology

## 2022-06-07 VITALS — BP 150/81 | HR 89

## 2022-06-07 DIAGNOSIS — M79605 Pain in left leg: Secondary | ICD-10-CM

## 2022-06-07 DIAGNOSIS — M79604 Pain in right leg: Secondary | ICD-10-CM | POA: Diagnosis not present

## 2022-06-07 DIAGNOSIS — R29898 Other symptoms and signs involving the musculoskeletal system: Secondary | ICD-10-CM

## 2022-06-07 DIAGNOSIS — C210 Malignant neoplasm of anus, unspecified: Secondary | ICD-10-CM

## 2022-06-07 DIAGNOSIS — G541 Lumbosacral plexus disorders: Secondary | ICD-10-CM

## 2022-06-07 DIAGNOSIS — R269 Unspecified abnormalities of gait and mobility: Secondary | ICD-10-CM

## 2022-06-07 MED ORDER — LAMOTRIGINE 25 MG PO TABS: ORAL_TABLET | ORAL | 5 refills | Status: AC

## 2022-06-07 NOTE — Patient Instructions (Addendum)
Lamotrigine 25 mg  One pill daily x 5 days, then One pill twice a day x 5 days, then One pill three times a day for 5 days, then Two pills twice daily.

## 2022-06-11 ENCOUNTER — Telehealth: Payer: Self-pay | Admitting: Neurology

## 2022-06-21 ENCOUNTER — Telehealth: Payer: Self-pay | Admitting: Neurology

## 2022-06-21 NOTE — Telephone Encounter (Signed)
medicare/BCBS NPR via website sent to [facility]

## 2022-06-28 ENCOUNTER — Telehealth: Payer: Self-pay | Admitting: Neurology

## 2022-06-28 ENCOUNTER — Other Ambulatory Visit: Payer: Self-pay | Admitting: *Deleted

## 2022-06-28 DIAGNOSIS — G541 Lumbosacral plexus disorders: Secondary | ICD-10-CM

## 2022-06-28 DIAGNOSIS — C210 Malignant neoplasm of anus, unspecified: Secondary | ICD-10-CM

## 2022-06-28 DIAGNOSIS — R29898 Other symptoms and signs involving the musculoskeletal system: Secondary | ICD-10-CM

## 2022-06-28 DIAGNOSIS — R269 Unspecified abnormalities of gait and mobility: Secondary | ICD-10-CM

## 2022-06-28 DIAGNOSIS — M79604 Pain in right leg: Secondary | ICD-10-CM

## 2022-06-28 NOTE — Telephone Encounter (Signed)
Jason Avila from [facility] called stating that the pt's therapist informed her that the pt would benefit from OT as well and they are wanting to know if an order can be sent in for OT.

## 2022-06-28 NOTE — Telephone Encounter (Signed)
Referral placed for OT

## 2022-07-02 ENCOUNTER — Other Ambulatory Visit: Payer: Self-pay | Admitting: *Deleted

## 2022-07-02 DIAGNOSIS — M79605 Pain in left leg: Secondary | ICD-10-CM

## 2022-07-02 DIAGNOSIS — R29898 Other symptoms and signs involving the musculoskeletal system: Secondary | ICD-10-CM

## 2022-07-02 DIAGNOSIS — G541 Lumbosacral plexus disorders: Secondary | ICD-10-CM

## 2022-07-02 DIAGNOSIS — R269 Unspecified abnormalities of gait and mobility: Secondary | ICD-10-CM

## 2022-07-03 ENCOUNTER — Ambulatory Visit: Payer: Medicare Other | Admitting: Diagnostic Neuroimaging

## 2022-07-03 NOTE — Telephone Encounter (Signed)
Referral for OT/PT sent to [facility] [phone].

## 2022-07-06 ENCOUNTER — Other Ambulatory Visit: Payer: Self-pay | Admitting: Neurology

## 2022-07-06 ENCOUNTER — Ambulatory Visit (HOSPITAL_COMMUNITY)
Admission: RE | Admit: 2022-07-06 | Discharge: 2022-07-06 | Disposition: A | Discharge status: Home / Self Care | Payer: Medicare Other | Source: Ambulatory Visit | Attending: Neurology | Admitting: Neurology

## 2022-07-06 DIAGNOSIS — G541 Lumbosacral plexus disorders: Secondary | ICD-10-CM

## 2022-07-06 DIAGNOSIS — C210 Malignant neoplasm of anus, unspecified: Secondary | ICD-10-CM | POA: Insufficient documentation

## 2022-07-06 DIAGNOSIS — R29898 Other symptoms and signs involving the musculoskeletal system: Secondary | ICD-10-CM | POA: Insufficient documentation

## 2022-07-10 ENCOUNTER — Encounter: Payer: Self-pay | Admitting: *Deleted

## 2022-07-11 ENCOUNTER — Encounter: Payer: Self-pay | Admitting: Internal Medicine

## 2022-07-20 ENCOUNTER — Inpatient Hospital Stay: Payer: Medicare Other

## 2022-07-20 ENCOUNTER — Inpatient Hospital Stay: Payer: Medicare Other | Attending: Hematology

## 2022-07-20 VITALS — BP 139/58 | HR 109 | Temp 97.1°F | Resp 18

## 2022-07-20 DIAGNOSIS — C211 Malignant neoplasm of anal canal: Secondary | ICD-10-CM | POA: Insufficient documentation

## 2022-07-20 DIAGNOSIS — Z95828 Presence of other vascular implants and grafts: Secondary | ICD-10-CM

## 2022-07-20 DIAGNOSIS — Z452 Encounter for adjustment and management of vascular access device: Secondary | ICD-10-CM | POA: Diagnosis present

## 2022-07-20 MED ORDER — HEPARIN SOD (PORK) LOCK FLUSH 100 UNIT/ML IV SOLN
500.0000 [IU] | Freq: Once | INTRAVENOUS | Status: AC
  Administered 2022-07-20: 500 [IU] via INTRAVENOUS

## 2022-07-20 MED ORDER — SODIUM CHLORIDE 0.9% FLUSH
10.0000 mL | INTRAVENOUS | Status: DC | PRN
  Administered 2022-07-20: 10 mL via INTRAVENOUS

## 2022-07-20 NOTE — Progress Notes (Signed)
Port flushed per protocol today. See flow sheet.   Patient tolerated it well without problems. Vitals stable and discharged home from clinic ambulatory. Follow up as scheduled.

## 2022-07-20 NOTE — Patient Instructions (Signed)
[place]  Discharge Instructions: Thank you for choosing [place] to provide your oncology and hematology care.  If you have a lab appointment with the [facility], please come in thru the Main Entrance and check in at the main information desk.  Wear comfortable clothing and clothing appropriate for easy access to any Portacath or PICC line.   We strive to give you quality time with your provider. You may need to reschedule your appointment if you arrive late (15 or more minutes).  Arriving late affects you and other patients whose appointments are after yours.  Also, if you miss three or more appointments without notifying the office, you may be dismissed from the clinic at the provider's discretion.      For prescription refill requests, have your pharmacy contact our office and allow 72 hours for refills to be completed.    Today you had your port flushed.    To help prevent nausea and vomiting after your treatment, we encourage you to take your nausea medication as directed.  BELOW ARE SYMPTOMS THAT SHOULD BE REPORTED IMMEDIATELY: *FEVER GREATER THAN 100.4 F (38 C) OR HIGHER *CHILLS OR SWEATING *NAUSEA AND VOMITING THAT IS NOT CONTROLLED WITH YOUR NAUSEA MEDICATION *UNUSUAL SHORTNESS OF BREATH *UNUSUAL BRUISING OR BLEEDING *URINARY PROBLEMS (pain or burning when urinating, or frequent urination) *BOWEL PROBLEMS (unusual diarrhea, constipation, pain near the anus) TENDERNESS IN MOUTH AND THROAT WITH OR WITHOUT PRESENCE OF ULCERS (sore throat, sores in mouth, or a toothache) UNUSUAL RASH, SWELLING OR PAIN  UNUSUAL VAGINAL DISCHARGE OR ITCHING   Items with * indicate a potential emergency and should be followed up as soon as possible or go to the Emergency Department if any problems should occur.  Please show the CHEMOTHERAPY ALERT CARD or IMMUNOTHERAPY ALERT CARD at check-in to the Emergency Department and triage nurse.  Should you have  questions after your visit or need to cancel or reschedule your appointment, please contact [place] [phone]  and follow the prompts.  Office hours are 8:00 a.m. to 4:30 p.m. Monday - Friday. Please note that voicemails left after 4:00 p.m. may not be returned until the following business day.  We are closed weekends and major holidays. You have access to a nurse at all times for urgent questions. Please call the main number to the clinic [phone] and follow the prompts.  For any non-urgent questions, you may also contact your provider using MyChart. We now offer e-Visits for anyone 32 and older to request care online for non-urgent symptoms. For details visit [email or web address].   Also download the MyChart app! Go to the app store, search "MyChart", open the app, select [facility], and log in with your MyChart username and password.  Masks are optional in the cancer centers. If you would like for your care team to wear a mask while they are taking care of you, please let them know. For doctor visits, patients may have with them one support person who is at least 86 years old. At this time, visitors are not allowed in the infusion area.

## 2022-08-08 NOTE — Progress Notes (Unsigned)
GI Office Note    Referring Provider: Pomposini, Cherly Anderson, MD Primary Care Physician:  Clinton Quant, MD  Primary Gastroenterologist: Dr. Abbey Chatters  Chief Complaint   No chief complaint on file.    History of Present Illness   Jason Avila is a 88 y.o. male presenting today at the request of Pomposini, Cherly Anderson, MD for ***anoscopy given recent diagnosis of anal cancer.  It appears patient previously followed with gastroenterology with the [facility] clinic.  Patient had rectal bleed and presented to [facility] and was transferred to the [facility] clinic.  He had EGD and colonoscopy on 12/08/2021 with normal EGD.  His colonoscopy showed 2 cm mass with cratered ulcer with distal margin abutting the anal verge.  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  He had reported a 30 pound weight loss that was unintentional in the prior 6 months.  He had also reported he started hemodialysis in September 2022.  He had MRI with rectal cancer protocol [facility] showing T1/T2 with no lymph nodes seen.  Craniocaudal length of tumor was 3.3 cm.  He follows with [facility] hematology/oncology with Dr. Delton Coombes.  Patient began XRT with 5-FU on 01/29/2022 and finished on 03/07/2022.  PET scan 5/20,023 did not show any evidence of recurrence or metastatic disease.  He was advised to return to the clinic in 6 months for follow-up and repeat labs with PET scan.  He was given GI referral due to need for endoscopy.  Last visit with Dr. Delton Coombes on 05/17/2022.  Patient reported pain in his knees.  He denied any bladder or sacral pain as well as any thigh pains.  He denied any GI symptoms.  He reported he was doing peritoneal dialysis at night.  MRI pelvis 07/06/2022 with persistent bone marrow edema associated with bilateral sacral insufficiency fractures, enlargement and increased signal in bilateral S1-S2 nerve roots suggesting nonspecific neuritis which could be related to  prior radiation, generalized intramuscular edema throughout the pelvis and imaged proximal thighs most pronounced within the bilateral gluteal musculature, bilateral iliac [place] muscles, and bilateral abductor musculature which could be due to myositis, muscle strain, or denervation.  No evidence of bone metastasis involving the pelvis.  Stable intraosseous meningioma within S1 segment.  Patient seen radiology oncology on 07/10/2022.  Patient was advised that he needs digital rectal exam.  Every 3 to 6 months as well as anoscopy every 6 to 12 months with annual imaging of the chest, abdomen, and pelvis for 3 years.  Radiology oncology prefers anoscopy for direct visualization and to possibly assist with narrowing/scarring in the anal canal.    Today:    Current Outpatient Medications  Medication Sig Dispense Refill   atorvastatin (LIPITOR) 20 MG tablet Take 20 mg by mouth daily at 6 PM.      B12-ACTIVE 1 MG CHEW Chew 1 tablet by mouth daily.     calcium acetate (PHOSLO) 667 MG tablet Take 667 mg by mouth 3 (three) times daily.     Cholecalciferol 25 MCG (1000 UT) capsule Take 1,000 Units by mouth daily.     dicyclomine (BENTYL) 10 MG capsule Take 1 capsule (10 mg total) by mouth 4 (four) times daily -  before meals and at bedtime. 120 capsule 1   docusate sodium (COLACE) 100 MG capsule Take 100 mg by mouth 2 (two) times daily.     epoetin alfa (EPOGEN) 10000 UNIT/ML injection Inject 1 Units into the skin every 14 (fourteen) days.  ferrous sulfate 325 (65 FE) MG tablet Take by mouth.     furosemide (LASIX) 80 MG tablet Take 80 mg by mouth daily.     gabapentin (NEURONTIN) 100 MG capsule Take 1 capsule (100 mg total) by mouth 4 (four) times daily. Take after dialysis 30 capsule 5   hydrALAZINE (APRESOLINE) 100 MG tablet Take 100 mg by mouth once.     HYDROcodone-acetaminophen (NORCO/VICODIN) 5-325 MG tablet Take 1 tablet by mouth every 4 (four) hours as needed. 10 tablet 0   lamoTRIgine  (LAMICTAL) 25 MG tablet Take as directed and then 2 pills po bid 120 tablet 5   levothyroxine (SYNTHROID) 50 MCG tablet Take by mouth.     magnesium oxide (MAG-OX) 400 (240 Mg) MG tablet Take 1 tablet (400 mg total) by mouth 2 (two) times daily. 60 tablet 3   Methoxy PEG-Epoetin Beta (MIRCERA IJ) Inject into the skin.     mirtazapine (REMERON) 15 MG tablet Take 15 mg by mouth daily.     Misc. Devices MISC Please provide patient with lift chair Dx: decreased mobility due to neuropathic pain in knees post radiation 1 each 0   Multiple Vitamins-Minerals (RENAPLEX-D) TABS Take by mouth.     Multiple Vitamins-Minerals (RENAPLEX-D) TABS Take 1 tablet by mouth daily.     Multiple Vitamins-Minerals (RENAPLEX-D) TABS Take by mouth.     ondansetron (ZOFRAN) 4 MG tablet Take 1 tablet (4 mg total) by mouth every 6 (six) hours. 12 tablet 0   pantoprazole (PROTONIX) 40 MG tablet Take 40 mg by mouth every morning.     sodium bicarbonate 650 MG tablet SMARTSIG:1 Tablet(s) By Mouth     sucroferric oxyhydroxide (VELPHORO) 500 MG chewable tablet Chew 500 tablets by mouth daily.     No current facility-administered medications for this visit.    Past Medical History:  Diagnosis Date   Anemia    Aortic valve stenosis    mild AS 04/2017 (followed by Dr. Cleora Fleet, [place])   Bilateral hearing loss    bilateral hearing aids   Cancer [facility]) 2018   basal cell carcinoma on nose   Chronic kidney disease    ckd stage 4, bilateral cysts on kidneys [facility])   Dysrhythmia    RBBB   GERD (gastroesophageal reflux disease)    Gout    left foot   Heart murmur    no problems   History of blood transfusion 2018   History of radiation therapy 11/18/19- 12/16/19   Larynx 54 Gy in 20 fx   Hyperlipidemia    Hypertension    Port-A-Cath in place 01/05/2022   Subdural hematoma ([place]) 11/2018   Wears glasses     Past Surgical History:  Procedure Laterality Date   BURR HOLE FOR SUBDURAL  HEMATOMA  12/15/2018   brain   COLONOSCOPY     EYE SURGERY Bilateral    cataracts removed   MICROLARYNGOSCOPY N/A 10/23/2019   Procedure: Microlaryngoscopy with vocal cord lesion biopsy;  Surgeon: Jerrell Belfast, MD;  Location: [place];  Service: ENT;  Laterality: N/A;   PERITONEAL CATHETER INSERTION     PORTACATH PLACEMENT Right 01/15/2022   Procedure: INSERTION PORT-A-CATH RIJ;  Surgeon: Rusty Aus, DO;  Location: [facility];  Service: General;  Laterality: Right;  pt can't come earlier d/t peritoneal dialysis   UPPER GI ENDOSCOPY      No family history on file.  Allergies as of 08/09/2022   (No Known Allergies)  Social History   Socioeconomic History   Marital status: Married    Spouse name: Not on file   Number of children: Not on file   Years of education: Not on file   Highest education level: Not on file  Occupational History   Not on file  Tobacco Use   Smoking status: Former    Packs/day: 0.25    Years: 40.00    Total pack years: 10.00    Types: Cigarettes    Quit date: 12/22/1996    Years since quitting: 25.6   Smokeless tobacco: Never   Tobacco comments:    Quit in 1998  Vaping Use   Vaping Use: Never used  Substance and Sexual Activity   Alcohol use: Never   Drug use: Never   Sexual activity: Yes  Other Topics Concern   Not on file  Social History Narrative   Right handed    Social Determinants of Health   Financial Resource Strain: Not on file  Food Insecurity: Not on file  Transportation Needs: No Transportation Needs (11/09/2019)   PRAPARE - Hydrologist (Medical): No    Lack of Transportation (Non-Medical): No  Physical Activity: Not on file  Stress: Not on file  Social Connections: Not on file  Intimate Partner Violence: Not At Risk (11/09/2019)   Humiliation, Afraid, Rape, and Kick questionnaire    Fear of Current or Ex-Partner: No    Emotionally Abused: No    Physically Abused: No    Sexually  Abused: No     Review of Systems   Gen: Denies any fever, chills, fatigue, weight loss, lack of appetite.  CV: Denies chest pain, heart palpitations, peripheral edema, syncope.  Resp: Denies shortness of breath at rest or with exertion. Denies wheezing or cough.  GI: see HPI GU : Denies urinary burning, urinary frequency, urinary hesitancy MS: Denies joint pain, muscle weakness, cramps, or limitation of movement.  Derm: Denies rash, itching, dry skin Psych: Denies depression, anxiety, memory loss, and confusion Heme: Denies bruising, bleeding, and enlarged lymph nodes.   Physical Exam   There were no vitals taken for this visit.  General:   Alert and oriented. Pleasant and cooperative. Well-nourished and well-developed.  Head:  Normocephalic and atraumatic. Eyes:  Without icterus, sclera clear and conjunctiva pink.  Ears:  Normal auditory acuity. Mouth:  No deformity or lesions, oral mucosa pink.  Lungs:  Clear to auscultation bilaterally. No wheezes, rales, or rhonchi. No distress.  Heart:  S1, S2 present without murmurs appreciated.  Abdomen:  +BS, soft, non-tender and non-distended. No HSM noted. No guarding or rebound. No masses appreciated.  Rectal:  Deferred  Msk:  Symmetrical without gross deformities. Normal posture. Extremities:  Without edema. Neurologic:  Alert and  oriented x4;  grossly normal neurologically. Skin:  Intact without significant lesions or rashes. Psych:  Alert and cooperative. Normal mood and affect.   Assessment   Jason Avila is a 88 y.o. male with a history of anemia, bilateral hearing loss, aortic valve stenosis, GERD, hyperlipidemia, hypertension, subdural hematoma in 2019, laryngeal/glottis cancer in 2020, and recently diagnosed anal cancer s/p chemotherapy/XRT completed in March 2023 presenting today for anoscopy for surveillance of rectal mass.  Anal cancer/anal mass: Initially found on colonoscopy in December 2022 and biopsy revealing  squamous cell carcinoma.  He had also had rectal bleeding as initial reason for performing the colonoscopy. He had rectal exam with palpable mass in the anal canal about 2 cm  from the anal verge predominantly on the right side. He underwent XRT with 5-FU on 01/29/2022 and finished on 03/07/2022.  Radiology oncology recommended rectal exams every 3 to 6 months and anoscopy every 6 to 12 months.   PLAN   *** Continue to follow with Dr. Delton Coombes with oncology and radiology oncology as needed. Follow-up for endoscopy in 6 months???***   Venetia Night, MSN, FNP-BC, AGACNP-BC [facility]

## 2022-08-09 ENCOUNTER — Encounter: Payer: Self-pay | Admitting: Gastroenterology

## 2022-08-09 ENCOUNTER — Ambulatory Visit (INDEPENDENT_AMBULATORY_CARE_PROVIDER_SITE_OTHER): Payer: Medicare Other | Admitting: Gastroenterology

## 2022-08-09 VITALS — BP 141/78 | HR 100 | Temp 97.7°F | Ht 71.0 in | Wt 170.2 lb

## 2022-08-09 DIAGNOSIS — C210 Malignant neoplasm of anus, unspecified: Secondary | ICD-10-CM | POA: Diagnosis not present

## 2022-08-09 NOTE — Progress Notes (Signed)
No more rectal bleeding. Following with rad-onc and oncology. Deloris and Malachy Mood wife and daughter are present.   Used to be constipated and was giving him over the counter supplement to help but have not had to use it in a good while. Stools have been more loose but not watery. Stools are dark due to the iron.   No appetite but weight is stable. Drinking boost everyday. 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 Not as mobile as he used to be either.

## 2022-08-09 NOTE — Patient Instructions (Signed)
No suspicious findings on anoscopy today.  You do have evidence of some irritation which is to be expected due to local radiation therapy.  I also did not find anything on digital rectal exam today.  Please continue to follow-up with Dr. Delton Coombes and radiology oncology.  We can see you back for another endoscopy in 6 to 12 months or sooner if new signs of recurrence.  I will reach out to Dr. Abbey Chatters to see if there is any further recommendations about repeat colonoscopy.  It was a pleasure to see you today. I want to create trusting relationships with patients. If you receive a survey regarding your visit,  I greatly appreciate you taking time to fill this out on paper or through your MyChart. I value your feedback.  Venetia Night, MSN, FNP-BC, AGACNP-BC [facility]

## 2022-09-24 ENCOUNTER — Telehealth: Payer: Self-pay | Admitting: Neurology

## 2022-09-24 NOTE — Telephone Encounter (Signed)
Pt's daughter, Jason Avila notified [facility] pt passed away last week.  Offered her our condolences

## 2022-09-24 NOTE — Telephone Encounter (Signed)
We will send sympathy card in mail next week once Dr. Felecia Shelling back to sign.

## 2022-10-04 ENCOUNTER — Ambulatory Visit: Payer: Medicare Other | Admitting: Neurology

## 2022-10-16 ENCOUNTER — Ambulatory Visit: Payer: Self-pay | Admitting: Radiation Oncology

## 2022-10-17 DEATH — deceased

## 2022-11-15 ENCOUNTER — Other Ambulatory Visit (HOSPITAL_COMMUNITY): Payer: Medicare Other

## 2022-11-15 ENCOUNTER — Other Ambulatory Visit: Payer: Medicare Other

## 2022-11-22 ENCOUNTER — Ambulatory Visit: Payer: Medicare Other | Admitting: Hematology
